# Patient Record
Sex: Male | Born: 1940 | Race: Black or African American | Hispanic: No | Marital: Married | Smoking: Former smoker
Health system: Southern US, Community
[De-identification: ages and names within clinical notes are randomized; demographics above are authoritative.]

## PROBLEM LIST (undated history)

## (undated) DIAGNOSIS — I209 Angina pectoris, unspecified: Secondary | ICD-10-CM

## (undated) DIAGNOSIS — I10 Essential (primary) hypertension: Secondary | ICD-10-CM

## (undated) DIAGNOSIS — G4733 Obstructive sleep apnea (adult) (pediatric): Secondary | ICD-10-CM

## (undated) DIAGNOSIS — I4892 Unspecified atrial flutter: Principal | ICD-10-CM

## (undated) DIAGNOSIS — I2510 Atherosclerotic heart disease of native coronary artery without angina pectoris: Secondary | ICD-10-CM

## (undated) HISTORY — PX: CORONARY ARTERY BYPASS GRAFT: SHX141

## (undated) HISTORY — PX: CARDIAC VALVE REPLACEMENT: SHX585

---

## 2009-03-28 ENCOUNTER — Encounter (INDEPENDENT_AMBULATORY_CARE_PROVIDER_SITE_OTHER): Payer: Self-pay | Admitting: Interventional Cardiology

## 2009-03-28 ENCOUNTER — Ambulatory Visit (HOSPITAL_COMMUNITY): Admission: RE | Admit: 2009-03-28 | Discharge: 2009-03-28 | Payer: Self-pay | Admitting: Interventional Cardiology

## 2009-03-28 ENCOUNTER — Inpatient Hospital Stay (HOSPITAL_BASED_OUTPATIENT_CLINIC_OR_DEPARTMENT_OTHER): Admission: RE | Admit: 2009-03-28 | Discharge: 2009-03-28 | Payer: Self-pay | Admitting: Interventional Cardiology

## 2009-03-28 ENCOUNTER — Ambulatory Visit: Payer: Self-pay | Admitting: Vascular Surgery

## 2009-04-01 ENCOUNTER — Ambulatory Visit: Payer: Self-pay | Admitting: Surgery

## 2009-04-04 ENCOUNTER — Ambulatory Visit: Payer: Self-pay | Admitting: Surgery

## 2009-04-09 ENCOUNTER — Encounter: Payer: Self-pay | Admitting: Surgery

## 2009-04-09 ENCOUNTER — Inpatient Hospital Stay (HOSPITAL_COMMUNITY): Admission: RE | Admit: 2009-04-09 | Discharge: 2009-04-16 | Payer: Self-pay | Admitting: Surgery

## 2009-04-09 ENCOUNTER — Ambulatory Visit: Payer: Self-pay | Admitting: Surgery

## 2009-05-06 ENCOUNTER — Encounter: Admission: RE | Admit: 2009-05-06 | Discharge: 2009-05-06 | Payer: Self-pay | Admitting: Surgery

## 2009-05-06 ENCOUNTER — Ambulatory Visit: Payer: Self-pay | Admitting: Surgery

## 2009-06-17 ENCOUNTER — Ambulatory Visit: Payer: Self-pay | Admitting: Surgery

## 2009-06-20 ENCOUNTER — Encounter (INDEPENDENT_AMBULATORY_CARE_PROVIDER_SITE_OTHER): Payer: Self-pay | Admitting: Cardiology

## 2009-06-20 ENCOUNTER — Ambulatory Visit (HOSPITAL_COMMUNITY): Admission: RE | Admit: 2009-06-20 | Discharge: 2009-06-20 | Payer: Self-pay | Admitting: Cardiology

## 2009-07-04 ENCOUNTER — Ambulatory Visit: Payer: Self-pay | Admitting: Surgery

## 2010-12-12 LAB — POCT I-STAT 4, (NA,K, GLUC, HGB,HCT)
Glucose, Bld: 114 mg/dL — ABNORMAL HIGH (ref 70–99)
Glucose, Bld: 120 mg/dL — ABNORMAL HIGH (ref 70–99)
Glucose, Bld: 128 mg/dL — ABNORMAL HIGH (ref 70–99)
Glucose, Bld: 128 mg/dL — ABNORMAL HIGH (ref 70–99)
Glucose, Bld: 129 mg/dL — ABNORMAL HIGH (ref 70–99)
Glucose, Bld: 135 mg/dL — ABNORMAL HIGH (ref 70–99)
Glucose, Bld: 136 mg/dL — ABNORMAL HIGH (ref 70–99)
Glucose, Bld: 143 mg/dL — ABNORMAL HIGH (ref 70–99)
Glucose, Bld: 170 mg/dL — ABNORMAL HIGH (ref 70–99)
HCT: 28 % — ABNORMAL LOW (ref 39.0–52.0)
HCT: 30 % — ABNORMAL LOW (ref 39.0–52.0)
HCT: 33 % — ABNORMAL LOW (ref 39.0–52.0)
HCT: 37 % — ABNORMAL LOW (ref 39.0–52.0)
HCT: 37 % — ABNORMAL LOW (ref 39.0–52.0)
HCT: 38 % — ABNORMAL LOW (ref 39.0–52.0)
Hemoglobin: 10.2 g/dL — ABNORMAL LOW (ref 13.0–17.0)
Hemoglobin: 10.2 g/dL — ABNORMAL LOW (ref 13.0–17.0)
Hemoglobin: 12.6 g/dL — ABNORMAL LOW (ref 13.0–17.0)
Hemoglobin: 12.6 g/dL — ABNORMAL LOW (ref 13.0–17.0)
Hemoglobin: 9.9 g/dL — ABNORMAL LOW (ref 13.0–17.0)
Potassium: 3.8 mEq/L (ref 3.5–5.1)
Potassium: 4 mEq/L (ref 3.5–5.1)
Potassium: 4.3 mEq/L (ref 3.5–5.1)
Potassium: 5.1 mEq/L (ref 3.5–5.1)
Potassium: 5.5 mEq/L — ABNORMAL HIGH (ref 3.5–5.1)
Sodium: 128 mEq/L — ABNORMAL LOW (ref 135–145)
Sodium: 129 mEq/L — ABNORMAL LOW (ref 135–145)
Sodium: 134 mEq/L — ABNORMAL LOW (ref 135–145)
Sodium: 136 mEq/L (ref 135–145)
Sodium: 136 mEq/L (ref 135–145)
Sodium: 138 mEq/L (ref 135–145)

## 2010-12-12 LAB — POCT I-STAT 3, VENOUS BLOOD GAS (G3P V)
Acid-Base Excess: 1 mmol/L (ref 0.0–2.0)
Bicarbonate: 28.3 mEq/L — ABNORMAL HIGH (ref 20.0–24.0)
pH, Ven: 7.3 (ref 7.250–7.300)

## 2010-12-12 LAB — POCT I-STAT, CHEM 8
BUN: 18 mg/dL (ref 6–23)
Calcium, Ion: 1.12 mmol/L (ref 1.12–1.32)
Chloride: 107 mEq/L (ref 96–112)
Creatinine, Ser: 0.8 mg/dL (ref 0.4–1.5)
Creatinine, Ser: 1.2 mg/dL (ref 0.4–1.5)
Glucose, Bld: 160 mg/dL — ABNORMAL HIGH (ref 70–99)
HCT: 26 % — ABNORMAL LOW (ref 39.0–52.0)
Hemoglobin: 8.8 g/dL — ABNORMAL LOW (ref 13.0–17.0)
Potassium: 3.8 mEq/L (ref 3.5–5.1)
Sodium: 141 mEq/L (ref 135–145)
TCO2: 23 mmol/L (ref 0–100)

## 2010-12-12 LAB — CREATININE, SERUM
GFR calc Af Amer: >60 mL/min (ref >60)
GFR calc non Af Amer: >60 mL/min (ref >60)

## 2010-12-12 LAB — POCT I-STAT 3, ART BLOOD GAS (G3+)
Acid-Base Excess: 2 mmol/L (ref 0.0–2.0)
Bicarbonate: 20.9 mEq/L (ref 20.0–24.0)
Bicarbonate: 24.4 mEq/L — ABNORMAL HIGH (ref 20.0–24.0)
Bicarbonate: 25.3 mEq/L — ABNORMAL HIGH (ref 20.0–24.0)
Bicarbonate: 27.5 mEq/L — ABNORMAL HIGH (ref 20.0–24.0)
Bicarbonate: 29.3 mEq/L — ABNORMAL HIGH (ref 20.0–24.0)
O2 Saturation: 100 %
O2 Saturation: 100 %
O2 Saturation: 98 %
O2 Saturation: 99 %
Patient temperature: 37
TCO2: 22 mmol/L (ref 0–100)
TCO2: 26 mmol/L (ref 0–100)
TCO2: 26 mmol/L (ref 0–100)
TCO2: 29 mmol/L (ref 0–100)
TCO2: 31 mmol/L (ref 0–100)
pCO2 arterial: 38 mmHg (ref 35.0–45.0)
pCO2 arterial: 40.4 mmHg (ref 35.0–45.0)
pCO2 arterial: 44.9 mmHg (ref 35.0–45.0)
pH, Arterial: 7.377 (ref 7.350–7.450)
pH, Arterial: 7.384 (ref 7.350–7.450)
pH, Arterial: 7.394 (ref 7.350–7.450)
pH, Arterial: 7.431 (ref 7.350–7.450)
pO2, Arterial: 396 mmHg — ABNORMAL HIGH (ref 80.0–100.0)
pO2, Arterial: 94 mmHg (ref 80.0–100.0)

## 2010-12-12 LAB — GLUCOSE, CAPILLARY
Glucose-Capillary: 122 mg/dL — ABNORMAL HIGH (ref 70–99)
Glucose-Capillary: 127 mg/dL — ABNORMAL HIGH (ref 70–99)
Glucose-Capillary: 130 mg/dL — ABNORMAL HIGH (ref 70–99)
Glucose-Capillary: 131 mg/dL — ABNORMAL HIGH (ref 70–99)
Glucose-Capillary: 137 mg/dL — ABNORMAL HIGH (ref 70–99)
Glucose-Capillary: 140 mg/dL — ABNORMAL HIGH (ref 70–99)
Glucose-Capillary: 143 mg/dL — ABNORMAL HIGH (ref 70–99)
Glucose-Capillary: 145 mg/dL — ABNORMAL HIGH (ref 70–99)
Glucose-Capillary: 147 mg/dL — ABNORMAL HIGH (ref 70–99)
Glucose-Capillary: 160 mg/dL — ABNORMAL HIGH (ref 70–99)
Glucose-Capillary: 167 mg/dL — ABNORMAL HIGH (ref 70–99)
Glucose-Capillary: 169 mg/dL — ABNORMAL HIGH (ref 70–99)
Glucose-Capillary: 170 mg/dL — ABNORMAL HIGH (ref 70–99)
Glucose-Capillary: 175 mg/dL — ABNORMAL HIGH (ref 70–99)
Glucose-Capillary: 180 mg/dL — ABNORMAL HIGH (ref 70–99)
Glucose-Capillary: 202 mg/dL — ABNORMAL HIGH (ref 70–99)
Glucose-Capillary: 67 mg/dL — ABNORMAL LOW (ref 70–99)

## 2010-12-12 LAB — BASIC METABOLIC PANEL
BUN: 11 mg/dL (ref 6–23)
BUN: 17 mg/dL (ref 6–23)
BUN: 24 mg/dL — ABNORMAL HIGH (ref 6–23)
CO2: 24 mEq/L (ref 19–32)
Calcium: 7.8 mg/dL — ABNORMAL LOW (ref 8.4–10.5)
Calcium: 8.8 mg/dL (ref 8.4–10.5)
Chloride: 100 mEq/L (ref 96–112)
Chloride: 104 mEq/L (ref 96–112)
Creatinine, Ser: 1.37 mg/dL (ref 0.4–1.5)
GFR calc Af Amer: >60 mL/min (ref >60)
GFR calc non Af Amer: >60 mL/min (ref >60)
GFR calc non Af Amer: >60 mL/min (ref >60)
Glucose, Bld: 152 mg/dL — ABNORMAL HIGH (ref 70–99)
Glucose, Bld: 153 mg/dL — ABNORMAL HIGH (ref 70–99)
Potassium: 3.6 mEq/L (ref 3.5–5.1)
Sodium: 137 mEq/L (ref 135–145)

## 2010-12-12 LAB — PROTIME-INR
Prothrombin Time: 12.8 seconds (ref 11.6–15.2)
Prothrombin Time: 17.1 seconds — ABNORMAL HIGH (ref 11.6–15.2)

## 2010-12-12 LAB — CBC
HCT: 23.3 % — ABNORMAL LOW (ref 39.0–52.0)
HCT: 28.5 % — ABNORMAL LOW (ref 39.0–52.0)
HCT: 32.6 % — ABNORMAL LOW (ref 39.0–52.0)
HCT: 40.9 % (ref 39.0–52.0)
Hemoglobin: 8.8 g/dL — ABNORMAL LOW (ref 13.0–17.0)
Hemoglobin: 9.8 g/dL — ABNORMAL LOW (ref 13.0–17.0)
MCHC: 33.6 g/dL (ref 30.0–36.0)
MCHC: 34.3 g/dL (ref 30.0–36.0)
MCV: 94.2 fL (ref 78.0–100.0)
MCV: 94.8 fL (ref 78.0–100.0)
MCV: 94.9 fL (ref 78.0–100.0)
MCV: 95.2 fL (ref 78.0–100.0)
MCV: 95.9 fL (ref 78.0–100.0)
Platelets: 104 10*3/uL — ABNORMAL LOW (ref 150–400)
Platelets: 135 10*3/uL — ABNORMAL LOW (ref 150–400)
Platelets: 158 10*3/uL (ref 150–400)
RBC: 2.45 MIL/uL — ABNORMAL LOW (ref 4.22–5.81)
RBC: 2.54 MIL/uL — ABNORMAL LOW (ref 4.22–5.81)
RBC: 2.7 MIL/uL — ABNORMAL LOW (ref 4.22–5.81)
RBC: 3.46 MIL/uL — ABNORMAL LOW (ref 4.22–5.81)
RBC: 4.31 MIL/uL (ref 4.22–5.81)
RDW: 13 % (ref 11.5–15.5)
RDW: 13.2 % (ref 11.5–15.5)
RDW: 13.3 % (ref 11.5–15.5)
WBC: 12 10*3/uL — ABNORMAL HIGH (ref 4.0–10.5)
WBC: 13.8 10*3/uL — ABNORMAL HIGH (ref 4.0–10.5)
WBC: 7.4 10*3/uL (ref 4.0–10.5)
WBC: 9.5 10*3/uL (ref 4.0–10.5)

## 2010-12-12 LAB — BLOOD GAS, ARTERIAL
Acid-Base Excess: 2.3 mmol/L — ABNORMAL HIGH (ref 0.0–2.0)
Bicarbonate: 26.3 mEq/L — ABNORMAL HIGH (ref 20.0–24.0)
TCO2: 27.6 mmol/L (ref 0–100)
pCO2 arterial: 40.8 mmHg (ref 35.0–45.0)
pO2, Arterial: 95.8 mmHg (ref 80.0–100.0)

## 2010-12-12 LAB — COMPREHENSIVE METABOLIC PANEL
AST: 28 U/L (ref 0–37)
Alkaline Phosphatase: 64 U/L (ref 39–117)
BUN: 10 mg/dL (ref 6–23)
CO2: 24 mEq/L (ref 19–32)
Chloride: 104 mEq/L (ref 96–112)
Creatinine, Ser: 0.97 mg/dL (ref 0.4–1.5)
GFR calc non Af Amer: >60 mL/min (ref >60)
Total Bilirubin: 1 mg/dL (ref 0.3–1.2)

## 2010-12-12 LAB — POCT I-STAT GLUCOSE: Glucose, Bld: 132 mg/dL — ABNORMAL HIGH (ref 70–99)

## 2010-12-12 LAB — TYPE AND SCREEN: ABO/RH(D): O POS

## 2010-12-12 LAB — ABO/RH: ABO/RH(D): O POS

## 2010-12-12 LAB — MAGNESIUM
Magnesium: 2.3 mg/dL (ref 1.5–2.5)
Magnesium: 2.5 mg/dL (ref 1.5–2.5)

## 2010-12-12 LAB — URINALYSIS, ROUTINE W REFLEX MICROSCOPIC
Glucose, UA: NEGATIVE mg/dL
Hgb urine dipstick: NEGATIVE
Protein, ur: NEGATIVE mg/dL
pH: 8 (ref 5.0–8.0)

## 2010-12-12 LAB — HEMOGLOBIN AND HEMATOCRIT, BLOOD
HCT: 28.3 % — ABNORMAL LOW (ref 39.0–52.0)
Hemoglobin: 9.9 g/dL — ABNORMAL LOW (ref 13.0–17.0)

## 2010-12-12 LAB — HEMOGLOBIN A1C: Mean Plasma Glucose: 137 mg/dL

## 2010-12-13 LAB — POCT I-STAT 3, VENOUS BLOOD GAS (G3P V)
Acid-Base Excess: 2 mmol/L (ref 0.0–2.0)
Bicarbonate: 28.4 mEq/L — ABNORMAL HIGH (ref 20.0–24.0)
O2 Saturation: 63 %
TCO2: 30 mmol/L (ref 0–100)
pH, Ven: 7.358 — ABNORMAL HIGH (ref 7.250–7.300)
pO2, Ven: 40 mmHg (ref 30.0–45.0)

## 2010-12-13 LAB — POCT I-STAT 3, ART BLOOD GAS (G3+)
Acid-Base Excess: 2 mmol/L (ref 0.0–2.0)
Acid-Base Excess: 2 mmol/L (ref 0.0–2.0)
Bicarbonate: 27.9 mEq/L — ABNORMAL HIGH (ref 20.0–24.0)
O2 Saturation: 89 %
TCO2: 29 mmol/L (ref 0–100)
pH, Arterial: 7.337 — ABNORMAL LOW (ref 7.350–7.450)
pO2, Arterial: 59 mmHg — ABNORMAL LOW (ref 80.0–100.0)

## 2011-01-19 NOTE — Assessment & Plan Note (Signed)
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  He was noted to have an  aortic insufficiency murmur on his follow up visit with Dr. Katrinka Blazing on  April 30, 2009 and 2-D echocardiogram confirmed that there was a mild  perivalvular leak as well as possibly some insufficiency through the  valve itself.  At that time, the patient was having some heart failure  symptoms with orthopnea and peripheral edema requiring diuretics.  He  required increased in dose of diuretics to manage his congestive heart  failure and was on 80 mg per day when I last saw him on October 12.  I  felt that it would be best to do a transesophageal echocardiogram to try  to determine whether this was a true perivalvular leak or whether there  was some valve dysfunction and causing regurgitation through the valve.  This was performed recently and showed that it was a perivalvular leak.  I think the degree of regurgitation is probably in the mild-to-moderate  range.  The valve itself appeared to be functioning well.  Left  ventricular function appeared good.   Since I last saw the patient he said that he has been feeling much  better.  He is walking daily without any shortness of breath.  He has  had no orthopnea or PND, is lying flat in bed.  He is now on Lasix 40 mg  per day and his lower extremity edema has completely resolved.  He is  anxious to return to work.  He said that he has not been having any  shortness of breath with ambulation or going up and down stairs which  was not the case preoperatively.   PHYSICAL EXAMINATION:  General:  Today, he looks well.  Vital Signs:  Blood pressure 148/60 with pulse of 70,  respiratory rate is 18 and  unlabored.  Oxygen saturation on room air is 98%.  Cardiac:  Regular  rate and rhythm with a grade 1-2/6 systolic murmur over his aorta.  There is a grade 2/6 decrescendo AI murmur.  Lungs:  Clear.  Chest:  The  chest incision is healing well and the sternum is stable.  There is no  peripheral edema in the legs.   His medications are Zocor 20 mg at bedtime, aspirin 81 mg daily,  Lopressor 25 mg b.i.d., and Lasix 40 mg per day.   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  I think  the 2-D echocardiogram is probably more reliable for quantitating this  and it was mild by 2-D echo.  The valve itself appears to be functioning  well without any significant regurgitation through the valve.  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  This diastolic dysfunction may be responsible for his  early postoperative congestive heart failure symptoms.  Clinically, he  is continuing to  improve and his diuretic dose is now down to 40 mg per  day with no peripheral edema.  He has no symptoms at this time.  I  think, given the degree of perivalvular leak and his lack of symptoms  that it would be best to continue following this with periodic  echocardiogram.  Aortic insufficiency is very well tolerated and  hopefully this will not require any intervention in the future.  We can  follow this with periodic echocardiogram to assess the degree of  regurgitation as well as assess his left ventricular function for signs  of deterioration.  I would not recommend proceeding with repair of this  problem unless he develops left ventricular dysfunction, worsening  symptoms, or hemolysis.  I discussed all this with the patient and his  wife and son, and they are in full  agreement.  The patient will continue  to follow up with Dr. Katrinka Blazing and hopefully, can have another  echocardiogram in about 6 months.  I will be happy to see the patient  after that.   Evelene Croon, M.D.  Electronically Signed   BB/MEDQ  D:  07/04/2009  T:  07/05/2009  Job:  [id number]

## 2011-01-19 NOTE — Op Note (Signed)
NAMEARAV, BANNISTER NO.:  [id number]   MEDICAL RECORD NO.:  [id number]          PATIENT TYPE:  INP   LOCATION:  2314                         FACILITY:  [facility]   PHYSICIAN:  Evelene Croon, M.D.     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   OPERATIVE PROCEDURE:  Median sternotomy, extracorporeal circulation,  coronary artery bypass graft surgery x3 using a left internal mammary  artery graft to left anterior descending coronary artery, saphenous vein  grafts to the obtuse marginal branch of the left circumflex coronary  artery, and the posterior descending branch of the right coronary.  Endoscopic vein harvesting from both legs.  Aortic valve replacement  using a 23-mm Edwards pericardial Magna-Ease valve.   ATTENDING SURGEON:  Evelene Croon, MD   ASSISTANT:  Coral Ceo, PA   ANESTHESIA:  General endotracheal.   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  He presented with a 1-year history of  exertional fatigue and chest discomfort that he could not remember very  specifically now.  The symptoms have progressed over the past year.  An  echocardiogram was done in February 2009, that was showing an aortic  valve area of 1.2 sq cm.  A more recent echo on March 13, 2009, showed  progression of his aortic stenosis to a moderate degree with aortic  valve area of 1.0 sq cm with a peak gradient of 41 and a mean gradient  of 16.  There was left ventricular diastolic dysfunction with relaxation  abnormality.  There was mild mitral regurgitation.  Ejection fraction of  50-55% with normal left  ventricular size.  The patient had a Cardiolite  scan done which showed a large area of anteroapical and anterolateral  ischemia with an ejection fraction of 53%.  He subsequently underwent  cardiac catheterization by Dr. Katrinka Blazing on March 28, 2009, which showed  about 50-60% calcified distal left main stenosis.  The LAD had an 80-90%  ostial stenosis and 80% distal stenosis.  The diagonal branch had about  90% proximal stenosis and that was occluded in its mid vessel with faint  filling of the distal vessel by collaterals.  Left circumflex had 60-70%  ostial stenosis compromising a large third marginal branch that also had  about 80% stenosis in it.  The right coronary artery was nondominant  vessel and had 99% midvessel and distal vessel stenosis.  After review  of the catheterization and examination with the patient, it was felt  that coronary bypass graft surgery and aortic valve replacement were the  best treatment to prevent further ischemia and infarction and improve  his symptoms and quality of life.  I discussed the operative procedure  with the patient and his wife including alternatives,  benefits, and  risks including but not limited to bleeding, blood transfusion,  infection, stroke, myocardial infarction, graft failure, heart block  requiring permanent pacemaker, and death.  We also discussed the pros  and cons of mechanical and tissue valves.  He decided that he would  rather have a tissue valve, so that he would not be on Coumadin and I  thought that it was reasonable choice given his age of 70 years with  severe coronary artery disease.  He understood the chance of this valve  may fail due to structural valve deterioration over his lifetime.   OPERATIVE PROCEDURE:  The patient was taken operating room and placed on  the table in the supine position.  After induction of general  endotracheal anesthesia, a Foley catheter was placed in the bladder  using sterile technique.  Then,  the chest, abdomen, and both lower  extremities were prepped and draped in the usual sterile manner.  Preoperative intravenous antibiotics were given.  Transesophageal  echocardiogram was performed and showed moderate aortic stenosis with an  aortic valve area measured about 1.2 sq cm.  There was some mild mitral  regurgitation.  Left ventricular function appeared well preserved.   Then, the chest was opened through a median sternotomy incision and the  pericardium was opened in the midline.  Examination of the heart showed  good ventricular contractility.  The ascending aorta was of normal size.  There was significant calcified plaque present in the aortic root  portion.  Then, the left internal mammary artery was harvested as a  pedicle graft.  This was a medium-to-large caliber vessel with excellent  blood flow through it.  At the same time, the segment of greater  saphenous vein was harvested from the right leg using endoscopic vein  harvest technique.  This vein was of medium size and good quality in the  thigh, but below the knee became very small and had a thin, friable wall  and it was not felt to be suitable.  Therefore, another section of  saphenous vein was harvested from the left thigh using endoscopic vein  harvest technique.  This vein was of medium size and good quality.   Then, the patient was heparinized.  When an adequate activated clotting  time was achieved, the distal ascending aorta was cannulated using a 22-  French aortic cannula for arterial inflow.  Venous outflow was achieved  using 2-stage venous cannula through the right atrial appendage.  An  antegrade cardioplegia and vent cannula was inserted in the aortic root.  A left ventricular vent was placed through the right superior pulmonary  vein and a retrograde cardioplegia cannula was inserted through the  right atrium and the coronary sinus.   Then, the patient was placed in the cardiopulmonary bypass and  distal  coronaries were identified.  He had severe diffuse calcific coronary  artery disease.  The LAD was intramyocardial along its proximal and  midportions.  It exited to the surface of the heart in its distal third,  just before there was an area of calcific plaque noted on the  catheterization responsible for the 80% distal stenosis.  Beyond this,  the apical portion of the LAD was diffusely calcified and not graftable.  Therefore, I decided to graft just before this lesion to supply the  proximal and mid LAD and the entire septum.  The diagonal branch was  diffusely calcified and not graftable.  There was no area that was soft  enough to  open.  This was a relatively small vessel, although long.  The  left circumflex gave off the large marginal branch which was visible  proximally and was suitable for grafting here before it became  intramyocardial.  The right coronary artery was a relatively small  vessel that was also diffusely diseased.  This was followed out along  the proximal portion of the posterior descending artery where there were  some calcified plaque.  Just beyond this calcified plaque, the artery  was felt to be graftable, although it was small.  The mid and distal  portions of the posterior descending artery were small and lying beneath  a large posterior descending vein.  The distal right coronary artery  beyond the posterior descending branch was also diffusely calcified.   Then, the aorta was crossclamped and 500 mL of cold blood antegrade  cardioplegia was administered in the aortic root with quick arrest of  the heart.  This was followed by 500 mL of cold blood retrograde  cardioplegia.  Systemic hypothermia to 20 degrees of centigrade and  topical hypothermic iced saline was used.  A temperature probe was  placed in the septum insulating pad in the pericardium.   Then, the first distal anastomosis was performed to the posterior  descending coronary artery.  The  internal diameter was about 1.5-1.6 mm.  The conduit used was a segment of greater saphenous vein and the  anastomosis was performed in an end-to-side manner using continuous 7-0  Prolene suture.  Flow was noted through the graft and was excellent.   The second distal anastomosis was performed to the obtuse marginal  branch.  The internal diameter of this vessel was about 2 mm.  The  conduit used was a segment of greater saphenous vein and the anastomosis  was performed in an end-to-side manner using continuous 7-0 Prolene  suture.  Flow was noted through the graft and was excellent.  Then,  another dose of cardioplegia was given down the vein grafts and into the  aortic root.   The third distal anastomosis was performed to the left anterior  descending coronary artery.  The internal diameter of this vessel was  about 1.75-2 mm.  The conduit used was the left internal mammary graft  and was brought through an opening of the left pericardium and anterior  to the phrenic nerve.  This was anastomosed to the LAD in an end-to-side  manner using continuous 8-0 Prolene suture.  The pedicle was sutured to  the epicardium with 6-0 Prolene sutures.  Then, another dose of  antegrade cardioplegia was given followed by retrograde cardioplegia and  throughout the remainder of the procedure.  Doses of retrograde cold  blood cardioplegia were given about 20-minute intervals to maintain  myocardial temperature around 10 degrees centigrade or less.   Then, the aorta was opened transversely about 2 cm above the sinotubular  junction.  We had to open it slightly higher due to the calcified plaque  in the aortic root.  Examination of the native valve showed that there  were 3 leaflets that were somewhat calcified and poorly mobile.  There  was mild annular calcification.  The right and left coronary ostia were  identified.  There was some calcified plaque around the ostia.  Then,  the native valve was  excised.  The annulus was decalcified with  rongeurs.  Care was taken to remove all particular debris.  The left  ventricle and aortic root were then irrigated with iced  saline solution.  Then, the annulus was sized and a 23-mm Edwards Interior and spatial designer  valve was chosen.  This had model number 3300TFX, serial number [id number].  Then, a series of pledgeted 2-0 Ethibond horizontal mattress sutures  were placed around the aortic cannula to the pledgets in the subannular  position.  The sutures were placed through the sewing ring and a valve  lowered into place.  The sutures were tied sequentially.  The valve  seated nicely.  The coronary ostia were not obstructed.  Then, the  patient was rewarmed to 37 degrees centigrade.  The aortotomy was closed  in 2 layers using continuous 4-0 Prolene suture.  The aortotomy was  closed that was lightly coated with CoSeal for hemostasis.   Then, the 2 proximal vein graft anastomoses were performed to the  midportion of the ascending aorta in an end-to-side manner using  continuous 6-0 Prolene suture.  Then, the left side of the heart was de-  aired and the head was placed in a Trendelenburg position.  Crossclamp  was removed with time of 140 minutes.  There was spontaneous return of a  complete heart block rhythm which continued to improve into sinus  rhythm.  The proximal and distal anastomoses appeared hemostatic and  allowed the grafts to be satisfactory.  Graft markers were placed around  the proximal anastomoses.  Two temporary right ventricular and right  atrial pacing wires were placed and brought through the skin.   The patient was rewarmed to 37 degrees centigrade.  He was weaned from  cardiopulmonary bypass on a low-dose dopamine.  Total bypass time was  177 minutes.  Cardiac function appeared excellent with cardiac output of  7 liters per minute.  Transesophageal echocardiogram was again reviewed  and showed a normal functioning aortic  valve prosthesis.  There was no  regurgitation or perivalvular leak.  There was mild mitral regurgitation  that was unchanged from preoperatively.  Left ventricular function  appeared well preserved.  Then, protamine was given and the venous and  aortic cannulas were removed without difficulty.  Hemostasis was  achieved.  Three chest tubes were placed with 2 in the posterior  pericardium, one in the left pleural space, and one in the anterior  mediastinum.  The sternum was then closed with double #6 stainless steel  wires.  The fascia was closed with continuous #1 Vicryl suture.  Subcutaneous tissue was closed with continuous 2-0 Vicryl and the skin  with 3-0 Vicryl subcuticular closure.  The lower extremity vein harvest  site was closed in layers in a similar manner.  The sponge, needle, and  instrument  counts were correct according to the scrub nurse.  Dry sterile dressing  was applied over the incisions around the chest tubes which were Pleur-  Evac suction.  The patient remained hemodynamically stable and  transferred to the SICU in guarded but stable condition.      Evelene Croon, M.D.  Electronically Signed     BB/MEDQ  D:  04/09/2009  T:  04/10/2009  Job:  [id number]   cc:   Lyn Records, M.D.

## 2011-01-19 NOTE — Consult Note (Signed)
NEW PATIENT CONSULTATION   [name] [name]  DOB:  11-21-1940                                        April 01, 2009  CHART #:  [id number]   REFERRING PHYSICIAN:  Lyn Records, MD   REASON FOR CONSULTATION:  Severe three-vessel coronary artery disease  and moderate aortic stenosis.   CLINICAL HISTORY:  I was asked by Dr. Katrinka [name] to evaluate the patient for  consideration of coronary artery bypass graft surgery and aortic valve  replacement.  He is a 70 year old African American gentleman who has a  history of rheumatic fever as a child.  He presented with about a1-year  history of exertional fatigue and chest discomfort that he cannot be  very specific about.  These symptoms are related to exertion such as  walking up stairs or carrying heavy objects.  They have progressed over  the past year.  An echocardiogram was done in February 2009 that showed  an aortic valve area of 1.2 sq. cm.  A more recent echo performed on  March 13, 2009, showed progression of his aortic stenosis to a moderate  degree with an aortic valve area of 1.0 sq. cm with a peak gradient of  41 and a mean gradient of 16.  There is left ventricular diastolic  dysfunction with relaxation abnormality.  There is mild mitral  regurgitation.  Ejection fraction was 50-55% with normal left  ventricular size.  The patient also underwent a Cardiolite exam which  showed a large area of anterior apical and anterolateral ischemia with  an ejection fraction of 53%.  He subsequently underwent cardiac  catheterization by Dr. Katrinka [name] on March 28, 2009.  This showed about 50-60%  calcified distal left main stenosis.  The LAD had 80-90% ostial stenosis  and 80% distal stenosis.  The diagonal branch had 90% proximal and 100%  midvessel stenosis with filling of the distal vessel slowly by  collaterals.  The left circumflex had 60-70% ostial stenosis  compromising a large third marginal branch that also had about 80%  stenosis.  The right coronary artery was nondominant vessel that had 99%  midvessel and distal vessel stenosis.   His review of systems is as follows:  GENERAL:  He denies any fever or chills.  He has had no recent weight  changes.  He has had fatigue.  EYES:  Negative.  ENT:  Negative.  He has seen a dentist recently and has had some ongoing  dental work.  CARDIOVASCULAR:  He has a vague chest tightness or pressure as well as  exertional shortness of breath.  He has had no symptoms at rest.  He  denies PND and orthopnea.  He denies peripheral edema.  RESPIRATORY:  He denies cough and sputum production.  GI:  He has had no nausea or vomiting.  Denies melena and bright red  blood per rectum.  GU:  Denies dysuria and hematuria.  VASCULAR:  He has had claudication in both calves while walking about  one-half mile.  This is relieved with rest.  NEUROLOGIC:  He denies any focal weakness or numbness.  He denies  dizziness and syncope.  He has never had TIA or a stroke.  MUSCULOSKELETAL:  He denies arthralgias and myalgias.  PSYCHIATRIC:  Negative.  HEMATOLOGIC:  Negative.   ALLERGIES:  None.   MEDICATIONS:  1.  Diovan 160 mg daily.  2. Simvastatin 20 mg at bedtime.  3. Omega-3 3000 mg daily.  4. Multivitamin daily.  5. Aspirin 81 mg daily.   PAST MEDICAL HISTORY:  Significant for hypertension.  He has a history  of aortic valve stenosis.  He has a history of rheumatic fever as a  child.  He denies any previous surgery.  His only previous  hospitalization was for rheumatic fever.   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  He is a previous smoker.  He has 2 adult children.  He drinks alcohol occasionally.   FAMILY HISTORY:  Negative for history of coronary disease or aortic  valve disease.   PHYSICAL EXAMINATION:  His blood pressure is 160/84, his pulse is 63 and  regular, respiratory rate is 18 and  unlabored.  Oxygen saturation on  room air is 97%.  He is a well-developed gentleman in no acute distress.  HEENT exam shows him to be normocephalic and atraumatic.  Pupils are  equal and reactive to light and accommodation.  Extraocular muscles are  intact.  His throat is clear.  Neck exam shows normal carotid pulses  bilaterally.  There is a transmitted murmur to both sides of the neck.  There is no adenopathy or thyromegaly.  Cardiac exam shows a regular  rate and rhythm with a grade 3/6 systolic murmur over the aorta.  There  is no diastolic murmur.  His lungs are clear.  Abdominal exam shows  active bowel sounds.  His abdomen is soft and nontender.  There are no  palpable masses or organomegaly.  Extremity exam shows no peripheral  edema.  Pedal pulses are not palpable bilaterally.  Skin is warm and  dry.  Neurologic exam shows him to be alert and oriented x3.  Motor and  sensory exam is grossly normal.   IMPRESSION:  The patient has severe three-vessel coronary disease and  moderate aortic stenosis with a markedly abnormal stress test.  He has  had progressive symptoms over the past year.  I agree that the best  treatment is coronary artery bypass graft surgery and aortic valve  replacement.  I discussed the choices of valve replacement prosthesis  including mechanical and tissue valves.  We discussed the pros and cons  of both.  He and his wife are in agreement that they would rather have a  tissue valve, so that he does not need to be on Coumadin.  I think that  it is a reasonable choice at his age with concomitant coronary disease.  I discussed the procedure of coronary bypass surgery and aortic valve  replacement including benefits and risks.  We discussed the risk of  bleeding, blood transfusion, infection, stroke, myocardial infarction,  graft failure, heart block requiring a permanent pacemaker, and death.  He understands all of this and would like to proceed as quickly as   possible.  We will schedule surgery on Wednesday, April 09, 2009.   [name] [name], M.D.  Electronically Signed   BB/MEDQ  D:  04/01/2009  T:  04/02/2009  Job:  [id number]   cc:   [name] [name], M.D.

## 2011-01-19 NOTE — Assessment & Plan Note (Signed)
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  His postoperative course  was complicated by atrial fibrillation, and he was cardioverted into  sinus rhythm.  Since discharge, he said that he had difficulty sleeping  in his bed at night for the first couple of weeks due to some vague  tightness in his chest.  He said he felt much better sitting up.  He did  not have any shortness of breath.  He said that these symptoms have  essentially resolved, and he is now sleeping in bed.  His appetite was  not very good initially but is improving.  He is walking daily without  chest pain or shortness of breath.  He saw Dr. Darci Needle the III  in his office on April 30, 2009.  He said that Dr. Katrinka Blazing had noticed a  new murmur suggesting aortic insufficiency and order an echocardiogram,  which was done this morning.  The patient denies any fever or chills,  and he said he has been feeling fairly well overall.   PHYSICAL EXAMINATION:  Vital Signs:  His blood pressure 146/68 and his  pulse is 69 and regular.  Respiratory rate is 18, unlabored.  Oxygen  saturation on room air is 99%.  He looks well.  Cardiac:  A regular rate  and rhythm with a grade 1/6 systolic murmur along the right upper  sternal border and a grade 2/6 decrescendo murmur of aortic  insufficiency.  The chest incision is healing well and the sternum is  stable.  His leg incision is healing well, and there is no peripheral  edema.   A followup chest x-ray shows clear lung fields and no pleural effusions.   MEDICATIONS:  1. Amiodarone 200 mg b.i.d.  2. Lopressor 25 mg b.i.d.  3. Iron 325 mg daily.  4. Aspirin 325 mg daily.  5. Zocor  20 mg at nighttime.   IMPRESSION:  Overall, the patient is recovering well following his  surgery.  He appears to be maintaining sinus rhythm on oral amiodarone.  Dr. Katrinka Blazing will decide when to discontinue his amiodarone.  He does have  a new diastolic murmur suggesting aortic insufficiency, which he did not  have while he was in the hospital.  His post bypass TEE showed no  evidence of perivalvular leak or regurgitation.  His murmur suggests  that he has developed a perivalvular leak.  He had an echocardiogram  today, and I will follow up on the results of that with Dr. Katrinka Blazing.  He  seems to be doing well overall without any shortness of breath.  I told  him he could return to driving a car when he feels comfortable with  that, but should refrain lifting anything heavier than 10 pounds for  total of 3 months from date of surgery.  He has a followup appointment  in late September with Dr. Katrinka Blazing, and I will plan to see him back after  that.   Evelene Croon, M.D.  Electronically Signed  BB/MEDQ  D:  05/06/2009  T:  05/07/2009  Job:  [id number]   cc:   Lyn Records, M.D.

## 2011-01-19 NOTE — H&P (Signed)
NAMEBECKY, BERBERIAN NO.:  [id number]   MEDICAL RECORD NO.:  [id number]          PATIENT TYPE:  OUT   LOCATION:  VASC                         FACILITY:  [facility]   PHYSICIAN:  Zenon Mayo, MDDATE OF BIRTH:  [birth year]   DATE OF ADMISSION:  03/28/2009  DATE OF DISCHARGE:  03/28/2009                              HISTORY & PHYSICAL   PROCEDURE:  Intraoperative transesophageal echocardiogram.   INDICATION:  Aortic stenosis.   DESCRIPTION:  Mr. Stracener is a 70 year old gentleman with a history of  hypertension and coronary disease who was found to have aortic stenosis  and is to be brought to the operating room today by Dr. Laneta Simmers for  coronary artery bypass grafting as well as replacement of his aortic  valve.  Intraoperative echocardiogram was requested to evaluate the  aortic valve repair as well as any other anomalies that may arise.  The  patient was brought to the operating room and placed under general  anesthesia.  After confirmation of endotracheal tube placement and  orogastric suctioning, a transesophageal echo probe was placed in the  patient's esophagus without complication.  The 4-chamber view of the  heart revealed normal cardiac motion in all chambers.  There was no  pericardial effusion noted.  Further evaluation of the left ventricle  revealed a mildly thickened ventricular wall; however, there were no  wall motion abnormalities seen and the ejection fraction was estimated  to be 60%.  The mitral valve was then imaged and the mitral valves had  minimal to no calcification.  The leaflets of the valve appeared to  coapt well.  When collar was placed across the valve only a trace amount  of mitral regurgitation was noted.  The aortic valve was then imaged and  revealed a trileaflet valve that was thickened with significantly  reduced excursion.  There was some minimal calcification seen.  The  aortic valve area was measured to be 1.2 cm2 by  planimetry.  A trace  amount of aortic insufficiency was noted in the long axis view.  The  aortic root measurements were all normal.  The next structure evaluated  was the pulmonic valve, which appeared to be both normal in structure  and function.  The tricuspid valve was also normal in both structure and  function.  The interatrial septum did not reveal any PFO or other septal  defects.  The left atrial appendage was free from thrombus.  The  thoracic aorta had minimal atherosclerotic disease throughout.   After weaning from the cardiopulmonary bypass machine on dopamine, the  heart was again evaluated.  A 23-mm stentless tissue valve has been  placed into the aortic position.  The leaflets of the valve were  difficult to visualize, but there did not seem to be tremendous amount  turbulent flow moving forward.  There did appear to be some small amount  of regurgitation around the valve when viewed in the long axis view;  however, there was no aortic insufficiency seen in the short axis view.  The amount of regurgitation was not significant enough to pursue any  further.  The mitral valve that revealed trace mitral regurgitation in  the prebypass had a mild amount of regurgitation post bypass.  The left  ventricle remained vigorous with normal wall motion and no abnormalities  were seen.  The patient actually had to be weaned off of the dopamine to  maintain a normal blood pressure as he had gotten too hyperdynamic on  the dopamine.  At the conclusion of the procedure, the transesophageal  echo probe was removed from the patient's esophagus without any evidence  of trauma or complication.  The patient was taken directly from the  operating room to the ICU in stable condition.          ______________________________  Zenon Mayo, MD    WEF/MEDQ  D:  04/09/2009  T:  04/10/2009  Job:  [id number]

## 2011-01-19 NOTE — Op Note (Signed)
NAMEKAYODE, PETION NO.:  [id number]   MEDICAL RECORD NO.:  [id number]          PATIENT TYPE:  INP   LOCATION:  2003                         FACILITY:  [facility]   PHYSICIAN:  Lyn Records, M.D.   DATE OF BIRTH:  1941/07/08   DATE OF PROCEDURE:  04/15/2009  DATE OF DISCHARGE:                               OPERATIVE REPORT   INDICATION:  Postoperative atrial flutter with difficult to control rate  despite beta-blocker therapy, Cardizem, and amiodarone.   PROCEDURE PERFORMED:  Elective cardioversion.   DESCRIPTION:  The attending anesthesiologist was Dr. Judie Petit.  Propofol 70 mg was titrated intravenously to get the patient asleep.  Once asleep, he was given a single discharge of 100 watts of biphasic  energy with reversion to sinus bradycardia.  The patient awakened from  anesthesia without evidence of embolic event.  Sinus bradycardia  persisted after the procedure.  Hemodynamics were stable.   CONCLUSIONS:  Successful conversion from atrial flutter to normal sinus  rhythm/sinus bradycardia.   PLAN:  1. Decrease beta-blocker dose to 25 mg of metoprolol b.i.d.  2. Continue to low with amiodarone with plan to continue amiodarone      therapy for 6-8 weeks postop and then discontinue.      Lyn Records, M.D.  Electronically Signed     HWS/MEDQ  D:  04/15/2009  T:  04/15/2009  Job:  [id number]   cc:   Evelene Croon, M.D.

## 2011-01-19 NOTE — Discharge Summary (Signed)
[name] [name], [name] [name] NO.:  [id number]   MEDICAL RECORD NO.:  [id number]          PATIENT TYPE:  INP   LOCATION:  2003                         FACILITY:  [facility]   PHYSICIAN:  Evelene Croon, M.D.     DATE OF BIRTH:  Jan 22, 1941   DATE OF ADMISSION:  04/09/2009  DATE OF DISCHARGE:  04/16/2009                               DISCHARGE SUMMARY   PRIMARY ADMITTING DIAGNOSES:  1. Severe three-vessel coronary artery disease.  2. Moderate aortic stenosis.   ADDITIONAL/DISCHARGE DIAGNOSES:  1. Severe three-vessel coronary artery disease.  2. Moderate aortic stenosis.  3. History of rheumatic fever.  4. Postoperative atrial flutter.  5. Hypertension.  6. Remote history of tobacco abuse.  7. Perioperative hyperglycemia with mildly elevated hemoglobin A1c at      6.4.  8. Postoperative pericarditis.   PROCEDURES PERFORMED:  1. Coronary artery bypass grafting x3 (left internal mammary artery to      the LAD, saphenous vein graft to the right coronary, saphenous vein      graft to the obtuse marginal).  2. Endoscopic vein harvest, left thigh and right lower extremity.  3. Aortic valve replacement with 23-mm Magna Ease pericardial tissue      valve.  4. Direct current cardioversion.   HISTORY:  The patient is a 70 year old male with a known history of  rheumatic fever as a child.  Approximately 1 year ago, he presented with  exertional fatigue and chest discomfort which were nonspecific and have  continued to progress.  He was seen by Dr. Katrinka Blazing and underwent an  echocardiogram in February 2009, which showed an aortic valve area of  1.2 cm2.  More recently in July 2010, an echo showed progression of his  aortic stenosis to a moderate degree with an aortic valve area of 1.0  cm2 with a peak gradient of 41 and a mean gradient of 16.  There was  left ventricular diastolic dysfunction and mild mitral regurgitation.  Ejection fraction was 50-55% with normal left ventricular  size.  The  patient underwent a Cardiolite exam which showed a large area of  anterior apical and anterolateral ischemia with an ejection fraction of  53%.  Thus, he underwent cardiac catheterization by Dr. Katrinka Blazing on March 28, 2009.  This showed about 50-60% calcified distal left main stenosis  with an 80-90% ostial LAD and an 80% distal LAD stenosis.  The diagonal  branch had a 90% proximal and 100% mid vessel stenosis with filling by  collaterals.  The left circumflex had a 60-70% ostial stenosis  compromising a large third marginal branch, also had about an 80%  stenosis.  The right was a nondominant vessel with a 99% midvessel and  distal vessel stenosis.  Because of these findings and his worsening  symptoms, he was referred to Dr. Evelene Croon as an outpatient  consultation for consideration of surgical revascularization.  After  review of his films, Dr. Laneta Simmers agreed that his best option would be to  proceed with CABG at this time.  He explained all risks, benefits, and  alternatives of surgery  to the patient and he agreed to proceed.   HOSPITAL COURSE:  [name] was admitted as an outpatient elective  admission on April 09, 2009.  He was taken to the operating room and  underwent CABG x3 and aortic valve replacement as described above.  Please see previously dictated operative report for complete details of  surgery.  He tolerated the procedure well and was transferred to the  SICU in stable condition.  He was able to be extubated shortly after  surgery.  He was hemodynamically stable, although hypotensive and  vasodilated on postop day 1, requiring a Neo-Synephrine drip.  He was  not immediately started on a beta blocker secondary to his blood  pressure and heart rate.  He also required backup atrial pacing.  By  postop day 2, he had been weaned from all drips and was restarted on a  low-dose beta blocker.  His chest tubes and lines were removed and he  was able to be  transferred to the step-down unit.  His postoperative  course has been complicated by a pericarditis which has been treated  conservatively and has symptomatically resolved at this point.  Also, he  developed atrial flutter which was not resolved with rapid atrial pacing  or IV amiodarone.  He ultimately required a direct current cardioversion  on April 15, 2009, by Dr. Katrinka Blazing.  He was successfully converted to  normal sinus rhythm.  He was continued on a low-dose beta blocker as  well as amiodarone.  Since that time, he has continued to maintain sinus  rhythm and blood pressures have been stable.  He has had mild volume  overload postoperatively and has been started on Lasix, to which, he is  responding well.  At the time of this dictation, he remains 1 kg above  his preoperative weight with some mild lower extremity edema on physical  exam.  He is ambulating in the halls without difficulty.  He is  tolerating regular diet and is having normal bowel and bladder function.  He has also been noted to have some hyperglycemia perioperatively and  has been treated conservatively with a carbohydrate modified diet and  sliding scale insulin as needed.  His preoperative hemoglobin A1c is  6.4, which will need to be monitored as an outpatient.  Overall, he is  doing well on postop day 7.  His incisions are all healing well.  He has  remained afebrile and his vital signs are stable and he is maintaining  normal sinus rhythm.  His incisions are healing well.  His most recent  labs show a sodium of 131, potassium 4.7, BUN 24, creatinine 1.37.  Hemoglobin 8.8, hematocrit 25.8, white count 12.8, platelets 161.  His  anemia has been stable and he has been treated with iron supplementation  and is doing well.  After morning round evaluation on April 16, 2009,  it is felt that he may be discharged home.   DISCHARGE MEDICATIONS:  Please see discharge medication management in E-  chart.   DISCHARGE  INSTRUCTIONS:  He is asked to refrain from driving, heavy  lifting, or strenuous activity.  He may continue ambulating daily and  using his incentive spirometer.  He may shower daily and clean his  incisions with soap and water.  He will continue a low-fat, low-sodium,  carbohydrate modified diet.   DISCHARGE FOLLOWUP:  He will need to make an appointment to see Dr.  Katrinka Blazing in 2 weeks for recheck.  The [facility] office  will contact him with an  appointment to see Dr. Laneta Simmers in 3 weeks with a chest x-ray.  He will  also need to follow up with his primary care physician for further  workup of his blood sugars.  In the interim, if he experiences any  problems or has questions, he is asked to contact our office  immediately.      Coral Ceo, P.A.      Evelene Croon, M.D.  Electronically Signed    GC/MEDQ  D:  04/16/2009  T:  04/16/2009  Job:  [id number]   cc:   Lyn Records, M.D.

## 2011-01-19 NOTE — Cardiovascular Report (Signed)
NAMEKAYLUB, DETIENNE NO.:  [id number]   MEDICAL RECORD NO.:  [id number]          PATIENT TYPE:  OIB   LOCATION:  1961                         FACILITY:  [facility]   PHYSICIAN:  Lyn Records, M.D.   DATE OF BIRTH:  [birth year]   DATE OF PROCEDURE:  03/28/2009  DATE OF DISCHARGE:  03/28/2009                            CARDIAC CATHETERIZATION   INDICATIONS:  Class III functional status due to angina.  Recent  severely abnormal Cardiolite study demonstrating a vast region of  anterior ischemia.  There is also aortic stenosis by echo with a valve  area in the 1.0-1.2 sq. cm range.  On clinical exam, the patient does  have a systolic murmur compatible with aortic stenosis.  This study is  being done to define his coronary anatomy and reassess the hemodynamics  of the aortic valve.   PROCEDURE PERFORMED:  1. Left heart cath.  2. Right heart cath.  3. Coronary angiography.  4. Thermodilution cardiac output.  5. Left ventriculography by hand injection.   DESCRIPTION:  After informed consent and following 1 mg of Versed and 50  mcg of fentanyl, a 7-French venous sheath was placed in the right  femoral using the modified Seldinger technique.  A 5-French right  femoral arterial sheath was also placed using the Seldinger technique.   We performed right heart cath with a 6-French Swan-Ganz catheter.  Oximetry samples and thermodilution outputs were performed.  Left heart  catheterization was performed with a 5-French A2 multipurpose catheter.  This catheter was used for left coronary angiography.  We used a Judkins  right 5-French catheter to cross the aortic valve.  We did a hand  injection with this catheter.  A pullback pressure across the aortic  valve was recorded.  Right coronary angiography was performed with this  catheter as well.   The patient tolerated the procedure without complications.   Hemostasis was achieved with manual compression.   RESULTS:  1.  Hemodynamic data:      a.     Right atrial mean pressure 6 mmHg.      b.     Right ventricular pressure 32/8 mmHg.      c.     Pulmonary artery pressure 30/90 mmHg.      d.     Pulmonary capillary wedge mean pressure 10 mmHg.      e.     Aortic pressure 128/55 mmHg.      f.     Left ventricular pressure 146/21 mmHg.      g.     Aortic pressure measured at pullback 141/65 mmHg.      h.     Fick cardiac output 6 L per minute.      i.     Thermodilution cardiac output 4.9 L per minute.      j.     Aortic valve area by the Fick output 2.8 sq. cm and by the       thermodilution cardiac output 2.36 sq. cm.  Aortic valve gradient       used was the pullback  gradient which was peak-to-peak only       approximately 2.5 to 3 mmHg.  When the initial aortic pressure is       compared to the initial LV pressure, there is an 18-mm peak-to-       peak gradient.  This was not used for the purpose of calculation.  2. Left ventriculography:  The left ventricle is not well opacified.      The anterior wall is mildly hypokinetic.  No significant mitral      regurgitation is noted.  EF is greater than 50%.  3. Coronary angiography:  All 3 coronary territories are heavily      calcified.      a.     Left main coronary:  The left main contains significant       distal diffuse stenosis of 50-60%.      b.     Left anterior descending coronary:  The LAD contains an       ostial eccentric greater than 85% stenosis proximally.  The vessel       is heavily calcified.  It gives origin to a large branching       diagonal.  The ostium of the diagonal was never well seen.  The       mid diagonal is totally occluded with sluggish collaterals that       reconstitute the mid to distal vessel.  The LAD beyond the mid       segment contains diffuse disease up to 70% before it wraps around       the left ventricular apex.      c.     Circumflex artery:  This is a large codominant vessel that       gives origin to a large  third obtuse marginal branch contains 70-       80% proximal stenosis.  The proximal portion of the circumflex       near the left main contains a tubular 70% narrowing.      d.     Right coronary:  The right coronary is a relatively small       nondominant to codominant vessel that contains high-grade mid and       distal disease before the acute marginal branch and the PDA that       are graded at 95 and 95% respectively.   CONCLUSIONS:  1. Mild-to-moderate aortic stenosis by hemodynamic data.  2. Severe multivessel coronary disease including left main.  3. Mild left ventricular dysfunction with anteroapical hypokinesis.  4. Normal right heart pressures.   PLAN:  Referral for coronary artery bypass grafting.  I believe we  should relook at the patient's aortic valve here by echo in [place]  to make a final decision about whether any treatment is necessary on the  valve.  The hemodynamic data from cath would suggest that aortic valve  surgery would not be indicated at this time.      Lyn Records, M.D.  Electronically Signed     HWS/MEDQ  D:  03/28/2009  T:  03/29/2009  Job:  [id number]   cc:   Kizzie Bane. Mohiuddin, MD  [facility] Office.

## 2011-01-19 NOTE — Assessment & Plan Note (Signed)
OFFICE VISIT   DELAN, KSIAZEK  DOB:  26-Feb-1941                                        June 17, 2009  CHART #:  [id number]   The patient returns today for follow up of aortic insufficiency that  developed postoperatively after the aortic valve replacement with a 23-  mm Edwards pericardial valve and coronary bypass surgery x3 on  04/09/2009.  He was initially seen back in the office by Dr. Katrinka Blazing on  August 25 and was noted to have a new murmur suggesting aortic  insufficiency.  An echocardiogram was performed which reportedly showed  mild perivalvular aortic insufficiency as well as some insufficiency  through the valve itself.  The patient had some symptoms suggesting  orthopnea and peripheral edema at that time and was treated with  diuretics.  According to Dr. Katrinka Blazing, the patient has required increased  dose of diuretic to manage his congestive heart failure.  He is  currently taking 80 mg per day.  The patient said that he still has  occasional episodes of shortness of breath when lying flat in bed,  improved with sitting upright.  These do not happen every day and are  somewhat unpredictable.  He has not had any shortness of breath with  exertion such as walking or going up and down stairs.  He has had no  chest pain.   PHYSICAL EXAMINATION:  Vital Signs:  Today, his blood pressure is  137/68, pulse is 56 and regular, respiratory rate is 18 and unlabored.  Oxygen saturation on room air is 98%.  General:  He looks well.  Cardiac:  A regular rate and rhythm with a grade 1-2/6 systolic murmur  over the aorta and a grade 2/6 decrescendo aortic insufficiency murmur.  Lungs:  Clear.  Chest:  The chest incision is well-healed.  Extremities:  There is moderate bilateral lower extremity edema to the knees.   His medications are Lasix 80 mg daily, potassium chloride, Lopressor 25  mg b.i.d., amiodarone 200 mg b.i.d., aspirin 325 mg daily, and Zocor 20  mg  at bedtime.   IMPRESSION:  The patient has an aortic insufficiency murmur  postoperatively with echocardiographic evidence of periprosthetic leak  as well as some insufficiency through the valve.  This was felt to be  mild by 2-D echocardiogram at the end of August.  Despite this finding  of mild insufficiency by 2-D echocardiogram, the patient has continued  to have congestive heart failure signs and symptoms and is currently on  Lasix 80 mg per day with significant bilateral lower extremity edema.  I  am concerned about the possibility that his aortic insufficiency may be  greater than initially noticed on 2-D echocardiogram.  I think it would  be best to proceed with a transesophageal echocardiogram to try to more  clearly define the aortic insufficiency and whether it is coming from  perivalvular leak or through the valve itself or both and to try to  quantitate the amount of insufficiency.  It may also help with  evaluating the left ventricular function and cavity size.  I discussed  the indications for this procedure with the patient and his wife and  son.  I discussed my impression that there was a probable perivalvular  and possible transvalvular leak that may require further intervention.  I told them  I would like to get this echocardiogram done first before  making any further decisions regarding treatment.  Dr. Katrinka Blazing will  schedule this in the near future and then I will see the patient back to  discuss the results and decide in what direction to proceed.   Evelene Croon, M.D.  Electronically Signed   BB/MEDQ  D:  06/17/2009  T:  06/18/2009  Job:  [id number]

## 2011-11-29 ENCOUNTER — Inpatient Hospital Stay (HOSPITAL_COMMUNITY)
Admission: AD | Admit: 2011-11-29 | Discharge: 2011-12-01 | DRG: 308 | Disposition: A | Discharge status: Home / Self Care | Payer: Medicare Other | Source: Ambulatory Visit | Attending: Interventional Cardiology | Admitting: Interventional Cardiology

## 2011-11-29 ENCOUNTER — Encounter (HOSPITAL_COMMUNITY): Payer: Self-pay

## 2011-11-29 DIAGNOSIS — I4892 Unspecified atrial flutter: Principal | ICD-10-CM | POA: Diagnosis present

## 2011-11-29 DIAGNOSIS — I10 Essential (primary) hypertension: Secondary | ICD-10-CM | POA: Diagnosis present

## 2011-11-29 DIAGNOSIS — Z79899 Other long term (current) drug therapy: Secondary | ICD-10-CM

## 2011-11-29 DIAGNOSIS — E669 Obesity, unspecified: Secondary | ICD-10-CM | POA: Diagnosis present

## 2011-11-29 DIAGNOSIS — Z953 Presence of xenogenic heart valve: Secondary | ICD-10-CM

## 2011-11-29 DIAGNOSIS — I2510 Atherosclerotic heart disease of native coronary artery without angina pectoris: Secondary | ICD-10-CM | POA: Diagnosis present

## 2011-11-29 DIAGNOSIS — I25810 Atherosclerosis of coronary artery bypass graft(s) without angina pectoris: Secondary | ICD-10-CM

## 2011-11-29 DIAGNOSIS — Z951 Presence of aortocoronary bypass graft: Secondary | ICD-10-CM

## 2011-11-29 DIAGNOSIS — I509 Heart failure, unspecified: Secondary | ICD-10-CM | POA: Diagnosis present

## 2011-11-29 DIAGNOSIS — I441 Atrioventricular block, second degree: Secondary | ICD-10-CM | POA: Diagnosis present

## 2011-11-29 DIAGNOSIS — Z6832 Body mass index (BMI) 32.0-32.9, adult: Secondary | ICD-10-CM

## 2011-11-29 DIAGNOSIS — Z87891 Personal history of nicotine dependence: Secondary | ICD-10-CM

## 2011-11-29 DIAGNOSIS — I739 Peripheral vascular disease, unspecified: Secondary | ICD-10-CM | POA: Diagnosis present

## 2011-11-29 DIAGNOSIS — Y831 Surgical operation with implant of artificial internal device as the cause of abnormal reaction of the patient, or of later complication, without mention of misadventure at the time of the procedure: Secondary | ICD-10-CM | POA: Diagnosis present

## 2011-11-29 DIAGNOSIS — I248 Other forms of acute ischemic heart disease: Secondary | ICD-10-CM | POA: Diagnosis present

## 2011-11-29 DIAGNOSIS — Z7901 Long term (current) use of anticoagulants: Secondary | ICD-10-CM

## 2011-11-29 DIAGNOSIS — T8209XA Other mechanical complication of heart valve prosthesis, initial encounter: Secondary | ICD-10-CM

## 2011-11-29 DIAGNOSIS — I359 Nonrheumatic aortic valve disorder, unspecified: Secondary | ICD-10-CM | POA: Diagnosis present

## 2011-11-29 DIAGNOSIS — I2489 Other forms of acute ischemic heart disease: Secondary | ICD-10-CM | POA: Diagnosis present

## 2011-11-29 DIAGNOSIS — T82897A Other specified complication of cardiac prosthetic devices, implants and grafts, initial encounter: Secondary | ICD-10-CM | POA: Diagnosis present

## 2011-11-29 DIAGNOSIS — I5033 Acute on chronic diastolic (congestive) heart failure: Secondary | ICD-10-CM | POA: Diagnosis present

## 2011-11-29 DIAGNOSIS — E785 Hyperlipidemia, unspecified: Secondary | ICD-10-CM | POA: Diagnosis present

## 2011-11-29 HISTORY — DX: Angina pectoris, unspecified: I20.9

## 2011-11-29 HISTORY — DX: Atherosclerotic heart disease of native coronary artery without angina pectoris: I25.10

## 2011-11-29 HISTORY — DX: Essential (primary) hypertension: I10

## 2011-11-29 LAB — DIFFERENTIAL
Basophils Absolute: 0.1 K/uL (ref 0.0–0.1)
Basophils Relative: 1 % (ref 0–1)
Eosinophils Absolute: 0.2 K/uL (ref 0.0–0.7)
Eosinophils Relative: 2 % (ref 0–5)
Lymphocytes Relative: 25 % (ref 12–46)
Lymphs Abs: 2.9 K/uL (ref 0.7–4.0)
Monocytes Absolute: 0.7 K/uL (ref 0.1–1.0)
Monocytes Relative: 6 % (ref 3–12)
Neutro Abs: 7.8 K/uL — ABNORMAL HIGH (ref 1.7–7.7)
Neutrophils Relative %: 67 % (ref 43–77)

## 2011-11-29 LAB — COMPREHENSIVE METABOLIC PANEL
AST: 37 U/L (ref 0–37)
Albumin: 4.1 g/dL (ref 3.5–5.2)
Calcium: 9.8 mg/dL (ref 8.4–10.5)
Creatinine, Ser: 1.11 mg/dL (ref 0.50–1.35)

## 2011-11-29 LAB — PROTIME-INR: Prothrombin Time: 14 seconds (ref 11.6–15.2)

## 2011-11-29 LAB — CARDIAC PANEL(CRET KIN+CKTOT+MB+TROPI)
CK, MB: 4.1 ng/mL — ABNORMAL HIGH (ref 0.3–4.0)
CK, MB: 4.5 ng/mL — ABNORMAL HIGH (ref 0.3–4.0)
Relative Index: 2.7 — ABNORMAL HIGH (ref 0.0–2.5)
Total CK: 154 U/L (ref 7–232)
Total CK: 141 U/L (ref 7–232)
Troponin I: <0.3 ng/mL (ref <0.30)

## 2011-11-29 LAB — CBC
HCT: 46.9 % (ref 39.0–52.0)
Hemoglobin: 16.1 g/dL (ref 13.0–17.0)
MCH: 31.9 pg (ref 26.0–34.0)
MCHC: 34.3 g/dL (ref 30.0–36.0)
MCV: 92.9 fL (ref 78.0–100.0)
Platelets: 245 K/uL (ref 150–400)
RBC: 5.05 MIL/uL (ref 4.22–5.81)
RDW: 12.8 % (ref 11.5–15.5)
WBC: 11.8 K/uL — ABNORMAL HIGH (ref 4.0–10.5)

## 2011-11-29 LAB — MRSA PCR SCREENING: MRSA by PCR: NEGATIVE

## 2011-11-29 LAB — APTT: aPTT: 27 s (ref 24–37)

## 2011-11-29 LAB — TSH: TSH: 1.121 u[IU]/mL (ref 0.350–4.500)

## 2011-11-29 MED ORDER — HEPARIN (PORCINE) IN NACL 100-0.45 UNIT/ML-% IJ SOLN
1350.0000 [IU]/h | INTRAMUSCULAR | Status: AC
  Administered 2011-11-29: 1350 [IU]/h via INTRAVENOUS
  Filled 2011-11-29 (×4): qty 250

## 2011-11-29 MED ORDER — NITROGLYCERIN 0.4 MG SL SUBL: 0.4000 mg | SUBLINGUAL_TABLET | SUBLINGUAL | Status: DC | PRN

## 2011-11-29 MED ORDER — SODIUM CHLORIDE 0.9 % IV SOLN: INTRAVENOUS | Status: DC

## 2011-11-29 MED ORDER — ACETAMINOPHEN 325 MG PO TABS: 650.0000 mg | ORAL_TABLET | ORAL | Status: DC | PRN

## 2011-11-29 MED ORDER — AMIODARONE HCL IN DEXTROSE 360-4.14 MG/200ML-% IV SOLN
30.0000 mg/h | INTRAVENOUS | Status: DC
  Administered 2011-11-29 – 2011-11-30 (×2): 30 mg/h via INTRAVENOUS
  Filled 2011-11-29 (×8): qty 200

## 2011-11-29 MED ORDER — METOPROLOL TARTRATE 25 MG PO TABS
25.0000 mg | ORAL_TABLET | Freq: Two times a day (BID) | ORAL | Status: DC
  Administered 2011-11-29 (×2): 25 mg via ORAL
  Filled 2011-11-29 (×4): qty 1

## 2011-11-29 MED ORDER — ASPIRIN EC 81 MG PO TBEC
81.0000 mg | DELAYED_RELEASE_TABLET | Freq: Every day | ORAL | Status: DC
  Administered 2011-11-30 – 2011-12-01 (×2): 81 mg via ORAL
  Filled 2011-11-29 (×2): qty 1

## 2011-11-29 MED ORDER — DEXTROSE 5 % IV SOLN
150.0000 mg | Freq: Once | INTRAVENOUS | Status: DC
  Filled 2011-11-29: qty 3

## 2011-11-29 MED ORDER — HEPARIN BOLUS VIA INFUSION
4000.0000 [IU] | Freq: Once | INTRAVENOUS | Status: AC
  Administered 2011-11-29: 4000 [IU] via INTRAVENOUS
  Filled 2011-11-29: qty 4000

## 2011-11-29 MED ORDER — ONDANSETRON HCL 4 MG/2ML IJ SOLN: 4.0000 mg | Freq: Four times a day (QID) | INTRAMUSCULAR | Status: DC | PRN

## 2011-11-29 MED ORDER — POTASSIUM CHLORIDE CRYS ER 20 MEQ PO TBCR
20.0000 meq | EXTENDED_RELEASE_TABLET | Freq: Two times a day (BID) | ORAL | Status: DC
  Administered 2011-11-29 – 2011-12-01 (×4): 20 meq via ORAL
  Filled 2011-11-29 (×5): qty 1

## 2011-11-29 MED ORDER — ATORVASTATIN CALCIUM 40 MG PO TABS
40.0000 mg | ORAL_TABLET | Freq: Every day | ORAL | Status: DC
  Administered 2011-11-29 – 2011-11-30 (×2): 40 mg via ORAL
  Filled 2011-11-29 (×3): qty 1

## 2011-11-29 MED ORDER — AMIODARONE LOAD VIA INFUSION
150.0000 mg | Freq: Once | INTRAVENOUS | Status: AC
  Administered 2011-11-29: 150 mg via INTRAVENOUS
  Filled 2011-11-29: qty 83.34

## 2011-11-29 MED ORDER — FUROSEMIDE 80 MG PO TABS
80.0000 mg | ORAL_TABLET | Freq: Every day | ORAL | Status: DC
  Administered 2011-11-29 – 2011-12-01 (×3): 80 mg via ORAL
  Filled 2011-11-29 (×3): qty 1

## 2011-11-29 MED ORDER — DEXTROSE 5 % IV SOLN
1.0000 mg/min | INTRAVENOUS | Status: DC
  Administered 2011-11-29: 1 mg/min via INTRAVENOUS
  Filled 2011-11-29: qty 9

## 2011-11-29 MED ORDER — AMIODARONE HCL IN DEXTROSE 360-4.14 MG/200ML-% IV SOLN
60.0000 mg/h | INTRAVENOUS | Status: DC
  Administered 2011-11-29 (×2): 60 mg/h via INTRAVENOUS
  Filled 2011-11-29 (×2): qty 200

## 2011-11-29 MED ORDER — ALPRAZOLAM 0.25 MG PO TABS: 0.2500 mg | ORAL_TABLET | Freq: Two times a day (BID) | ORAL | Status: DC | PRN

## 2011-11-29 NOTE — Progress Notes (Signed)
ANTICOAGULATION CONSULT NOTE - Initial Consult  Pharmacy Consult for heparin Indication: aflutter  Allergies not on file  Patient Measurements: Ht: 5' 11' Wt:: 106kg IBW=75.3 Heparin Dosing Weight: 97kg     Labs: No results found for this basename: HGB:2,HCT:3,PLT:3,APTT:3,LABPROT:3,INR:3,HEPARINUNFRC:3,CREATININE:3,CKTOTAL:3,CKMB:3,TROPONINI:3 in the last 72 hours CrCl is unknown because there is no height on file for the current visit.  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   Goal of Therapy:  Heparin level 0.3-0.7 units/ml   Plan:  -Heparin 4000 unit bolus followed by 1350 units /hr (~14 units/kg/hr) -Heparin level in 6 hrs and daily with CBC daily   Benny Lennert 11/29/2011,12:42 PM

## 2011-11-29 NOTE — Progress Notes (Signed)
ANTICOAGULATION CONSULT NOTE - Follow Up Consult  Pharmacy Consult for Heparin Indication: Aflutter  No Known Allergies  Patient Measurements: Height: 5\' 11"  (180.3 cm) Weight: 233 lb 11 oz (106 kg) IBW/kg (Calculated) : 75.3  Heparin Dosing Weight: 97kg  Vital Signs: Temp: 98.5 F (36.9 C) (03/25 2000) Temp src: Oral (03/25 2000) BP: 122/63 mmHg (03/25 2100) Pulse Rate: 103  (03/25 1643)  Labs:  Basename 11/29/11 2054 11/29/11 1808 11/29/11 1255 11/29/11 1240  HGB -- -- 16.1 --  HCT -- -- 46.9 --  PLT -- -- 245 --  APTT -- -- 27 --  LABPROT -- -- 14.0 --  INR -- -- 1.06 --  HEPARINUNFRC 0.66 -- -- --  CREATININE -- -- 1.11 --  CKTOTAL -- 141 -- 154  CKMB -- 4.5* -- 4.1*  TROPONINI -- 0.36* -- <0.30   Estimated Creatinine Clearance: 77.8 ml/min (by C-G formula based on Cr of 1.11).   Medications:  Heparin 1350 units/hr (13.5 ml/hr)  Assessment: 70yom on heparin for aflutter with RVR. Heparin level (0.66) is therapeutic.  - H/H and Plts wnl - No significant bleeding reported  Goal of Therapy:  Heparin level 0.3-0.7 units/ml   Plan:  1. Continue heparin 1350 units/hr (13.5 ml/hr) 2. Follow-up AM heparin level and CBC  Cleon Dew [phone] 11/29/2011,9:49 PM

## 2011-11-29 NOTE — Plan of Care (Signed)
Problem: Phase I Progression Outcomes Goal: Anticoagulation Therapy per MD order Outcome: Completed/Met Date Met:  11/29/11 Heparin per pharmacy Goal: Heart rate or rhythm control medication Outcome: Completed/Met Date Met:  11/29/11 Amioadarone gtt Goal: Initial discharge plan identified Outcome: Completed/Met Date Met:  11/29/11 Plans to return home with wife

## 2011-11-29 NOTE — H&P (Signed)
Office Visit     Patient: [name] [name], [name] [name] Provider: Verdis Prime, MD  DOB: 1941-01-22 Age: 71 Y Sex: Male Date: 11/29/2011  Phone: [phone]   Address: [address] [address], [place], [id number]       Subjective:     CC:    1. HS/per Dr. Arbutus Leas.        HPI:  General:  Developed sudden dyspnea and orthopnea last evening. There was also mild chest tightness. He had polyuria. He did not have syncope. He denies palpitations. He has not had this type complaints in the past. He has not had edema. Also the symptoms was acute. By this morning he was somewhat better although he still noticed dyspnea on exertion and weakness. The office an EKG demonstrated tachycardia and we believe to be atrial flutter with 2 to one block..        ROS:  CONSTITUTIONAL:  Patient denies chills, fatigue, fever, insomnia, night sweats, and anorexia.  CARDIOLOGY:  no Chest tightness. no Claudication. no Cyanosis. no Dyspnea on exertion. no Edema. no Fatigue. no Irregular heart beat. no Murmurs. no Near Syncope. no Orthopnea. no Palpitations. no PND (paroxsymal nocturnal dyspnea). Signs of GI bleeding None. Snoring and/or insomnia None. Syncope no. Transient neurological symptoms None. Visual changes none.  RESPIRATORY:  Patient denies DOE (dyspnea on exertion), cough, blood-tinged sputum, hemoptysis, pain with breathing , wheezing.  GASTROENTEROLOGY:  Patient denies acid reflux, black stools, blood in stool, diarrhea, clay colored stools, dysphagia.  MUSCULOSKELETAL:  Patient denies back pain, carpal tunnel, joint stiffness, joint swelling.  NEUROLOGY:  Patient denies headache, insomnia, confusion, gait abnormality, paralysis, paresthesias, seizures, transient neurologiacal deficits..  PSYCHOLOGY:  Patient denies anxiety, mania, memory loss, nervousness, nightmares .         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        Family History: Non-Contributory There is no history of premature coronary atherosclerosis.       Social History:  General:  History of smoking cigarettes: Former smoker, Quit in year 1980.  no Smoking.  no Tobacco Exposure.  Alcohol: yes, social, Daily.  Exercise: yes, walks.  Occupation: employed.  Education: yes.  Marital Status: married.  Children: 2, Boys.        Medications: Lasix 80 MG Tablet 1 tablet qd, Aspirin 325 MG Tablet Chewable 1 tablet Once a day, Amoxicillin 500 MG Capsule 4 capsules an hour prior to dental appt, Omega 3 1000 MG Capsule 3 tablets daily, OTC nite works,snack defense,malefactor,rosegard as directed at night, MVI as directed daily, Klor-Con M20 20 MEQ Tablet Extended Release TAKE 1 TABLET TWICE A DAY , Metoprolol Tartrate 25 MG Tablet TAKE 1 TABLET BY MOUTH TWICE A DAY , Crestor 20 MG Tablet 1 tablet Once a day, Medication List reviewed and reconciled with the patient       Allergies: N.K.D.A.      Objective:     Vitals: Wt 239.2, Wt change 1.4 lb, Ht 69.25, BMI 35.07, Pulse sitting 120 apical, BP sitting 134/76.       Examination:  Cardiology Exam:  GENERAL APPEARANCE: pleasant, NAD, comfortable, obese, elderly, male.  HEENT: normal.  CAROTID UPSTROKE: no bruit, upstrokes intact.  JVD: flat.  HEART: tachycardia with regular rhythm, normal S1S2, no rub, no gallop, or click.  HEART MURMUR: grade 1/6, systolic ejection and , grade 4-0/9, diastolic, LLSB c/w AR.  LUNGS: clear to auscultation, no wheezing/rhonchi/rales.  ABDOMEN: soft, non-tender, no hepatomegaly, no masses palpated.  EXTREMITIES: Bilateral trace leg edema. Bilateral femoral bruit., R louder than left . Fem pulse 1plus bilat.Marland Kitchen    PERIPHERAL PULSES: absent in pop., PT, and DP bilat.Marland Kitchen  NEUROLOGIC: grossly intact, cranial nerves intact, gait WNL.  MOOD: normal.        Assessment:     Assessment:  1. Atrial flutter with rapid ventricular response - 427.32 (Primary)  2. Chest pain - 786.50  3. Acute on chronic diastolic heart failure - 428.33, due to rate  4. 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 Coronary atherosclerosis of native coronary artery - 414.01, chest pain may be demand related  6. Claudication - 443.9, stable  7. Hypertension, essential - 401.1, controlled.    Plan:     1. Atrial flutter with rapid ventricular response  Admit to hospital. IV heparin. IV amiodarone. Electrical cardioversion is he does not convert.       2. Chest pain  Diagnostic Imaging:EKG A flutter with v rate 118, Boehler,Eileen 11/29/2011 09:26:39 AM > Jowana Thumma 11/29/2011 09:46:02 AM >  Rule out MI with serial markers and EKGs.       3. Acute on chronic diastolic heart failure  May need additional Lasix.       4. Aortic prosthetic valve regurgitation  We will need to repeat his echocardiogram to rule out worsening of aortic regurgitation from perivalvular leak.        Immunizations:        Labs:        Procedure Codes: 81191 EKG I AND R       Preventive:        Provider: Verdis Prime, MD  Patient: [name] [name], [name] [name] DOB: 02-09-1941 Date: 11/29/2011

## 2011-11-30 ENCOUNTER — Encounter (HOSPITAL_COMMUNITY): Payer: Self-pay | Admitting: Anesthesiology

## 2011-11-30 ENCOUNTER — Inpatient Hospital Stay (HOSPITAL_COMMUNITY): Payer: Medicare Other

## 2011-11-30 ENCOUNTER — Encounter (HOSPITAL_COMMUNITY)
Admission: AD | Disposition: A | Discharge status: Home / Self Care | Payer: Self-pay | Source: Ambulatory Visit | Attending: Interventional Cardiology

## 2011-11-30 ENCOUNTER — Other Ambulatory Visit: Payer: Self-pay

## 2011-11-30 ENCOUNTER — Inpatient Hospital Stay (HOSPITAL_COMMUNITY): Payer: Medicare Other | Admitting: Anesthesiology

## 2011-11-30 ENCOUNTER — Encounter (HOSPITAL_COMMUNITY): Payer: Self-pay | Admitting: Cardiology

## 2011-11-30 DIAGNOSIS — I4892 Unspecified atrial flutter: Principal | ICD-10-CM

## 2011-11-30 HISTORY — PX: TEE WITHOUT CARDIOVERSION: SHX5443

## 2011-11-30 LAB — CBC
HCT: 44.8 % (ref 39.0–52.0)
Hemoglobin: 15 g/dL (ref 13.0–17.0)
MCH: 31.4 pg (ref 26.0–34.0)
MCHC: 33.5 g/dL (ref 30.0–36.0)

## 2011-11-30 LAB — HEMOGLOBIN A1C
Hgb A1c MFr Bld: 7 % — ABNORMAL HIGH (ref <5.7)
Mean Plasma Glucose: 154 mg/dL — ABNORMAL HIGH (ref <117)

## 2011-11-30 SURGERY — ECHOCARDIOGRAM, TRANSESOPHAGEAL
Anesthesia: Moderate Sedation

## 2011-11-30 MED ORDER — SODIUM CHLORIDE 0.45 % IV SOLN: INTRAVENOUS | Status: DC

## 2011-11-30 MED ORDER — PROPOFOL 10 MG/ML IV BOLUS
INTRAVENOUS | Status: DC | PRN
  Administered 2011-11-30: 100 mg via INTRAVENOUS

## 2011-11-30 MED ORDER — MIDAZOLAM HCL 10 MG/2ML IJ SOLN
INTRAMUSCULAR | Status: AC
  Filled 2011-11-30: qty 2

## 2011-11-30 MED ORDER — METOPROLOL TARTRATE 50 MG PO TABS
50.0000 mg | ORAL_TABLET | Freq: Two times a day (BID) | ORAL | Status: DC
  Administered 2011-11-30 – 2011-12-01 (×3): 50 mg via ORAL
  Filled 2011-11-30 (×4): qty 1

## 2011-11-30 MED ORDER — FENTANYL CITRATE 0.05 MG/ML IJ SOLN
INTRAMUSCULAR | Status: DC | PRN
  Administered 2011-11-30 (×2): 25 ug via INTRAVENOUS

## 2011-11-30 MED ORDER — SODIUM CHLORIDE 0.9 % IV SOLN
INTRAVENOUS | Status: DC | PRN
  Administered 2011-11-30: 19:00:00 via INTRAVENOUS

## 2011-11-30 MED ORDER — LIDOCAINE HCL 2 % EX GEL
CUTANEOUS | Status: AC
  Filled 2011-11-30: qty 5

## 2011-11-30 MED ORDER — RIVAROXABAN 10 MG PO TABS
20.0000 mg | ORAL_TABLET | Freq: Every day | ORAL | Status: DC
  Administered 2011-11-30 – 2011-12-01 (×2): 20 mg via ORAL
  Filled 2011-11-30 (×2): qty 2

## 2011-11-30 MED ORDER — SODIUM CHLORIDE 0.9 % IJ SOLN
3.0000 mL | Freq: Two times a day (BID) | INTRAMUSCULAR | Status: DC
  Administered 2011-11-30 – 2011-12-01 (×2): 3 mL via INTRAVENOUS

## 2011-11-30 MED ORDER — SODIUM CHLORIDE 0.9 % IJ SOLN: 3.0000 mL | INTRAMUSCULAR | Status: DC | PRN

## 2011-11-30 MED ORDER — SODIUM CHLORIDE 0.9 % IV SOLN
2.0000 g | Freq: Once | INTRAVENOUS | Status: AC
  Administered 2011-11-30: 2 g via INTRAVENOUS
  Filled 2011-11-30 (×2): qty 2000

## 2011-11-30 MED ORDER — SODIUM CHLORIDE 0.9 % IV SOLN: 250.0000 mL | INTRAVENOUS | Status: DC | PRN

## 2011-11-30 MED ORDER — MIDAZOLAM HCL 10 MG/2ML IJ SOLN
INTRAMUSCULAR | Status: DC | PRN
  Administered 2011-11-30 (×3): 1 mg via INTRAVENOUS

## 2011-11-30 MED ORDER — FENTANYL CITRATE 0.05 MG/ML IJ SOLN
INTRAMUSCULAR | Status: AC
  Filled 2011-11-30: qty 2

## 2011-11-30 MED ORDER — LIDOCAINE VISCOUS 2 % MT SOLN
OROMUCOSAL | Status: AC
  Filled 2011-11-30: qty 15

## 2011-11-30 MED ORDER — SODIUM CHLORIDE 0.9 % IV SOLN
INTRAVENOUS | Status: DC
  Administered 2011-11-30: 500 mL via INTRAVENOUS

## 2011-11-30 MED ORDER — BUTAMBEN-TETRACAINE-BENZOCAINE 2-2-14 % EX AERO
INHALATION_SPRAY | CUTANEOUS | Status: DC | PRN
  Administered 2011-11-30: 2 via TOPICAL

## 2011-11-30 MED ORDER — DIPHENHYDRAMINE HCL 50 MG/ML IJ SOLN
INTRAMUSCULAR | Status: AC
  Filled 2011-11-30: qty 1

## 2011-11-30 MED ORDER — LIDOCAINE VISCOUS 2 % MT SOLN
OROMUCOSAL | Status: DC | PRN
  Administered 2011-11-30: 5 mL via OROMUCOSAL

## 2011-11-30 NOTE — Transfer of Care (Signed)
Immediate Anesthesia Transfer of Care Note  Patient: [name] [name]  Procedure(s) Performed: * No procedures listed *  Patient Location: ICU  Anesthesia Type: General  Level of Consciousness: alert  and oriented  Airway & Oxygen Therapy: Patient Spontanous Breathing and Patient connected to nasal cannula oxygen  Post-op Assessment: Report given to PACU RN and Post -op Vital signs reviewed and stable  Post vital signs: Reviewed and stable  Complications: No apparent anesthesia complications

## 2011-11-30 NOTE — Progress Notes (Signed)
ANTICOAGULATION CONSULT NOTE - Follow Up Consult  Pharmacy Consult for Heparin Indication: Aflutter  No Known Allergies  Patient Measurements: Height: 5\' 11"  (180.3 cm) Weight: 233 lb 11 oz (106 kg) IBW/kg (Calculated) : 75.3  Heparin Dosing Weight: 97kg  Vital Signs: Temp: 98.1 F (36.7 C) (03/26 0851) Temp src: Oral (03/26 0851) BP: 136/67 mmHg (03/26 0445)  Labs:  Alvira Philips 11/30/11 0527 11/30/11 0031 11/29/11 2054 11/29/11 1808 11/29/11 1255 11/29/11 1240  HGB 15.0 -- -- -- 16.1 --  HCT 44.8 -- -- -- 46.9 --  PLT 217 -- -- -- 245 --  APTT -- -- -- -- 27 --  LABPROT -- -- -- -- 14.0 --  INR -- -- -- -- 1.06 --  HEPARINUNFRC 0.67 -- 0.66 -- -- --  CREATININE -- -- -- -- 1.11 --  CKTOTAL -- 123 -- 141 -- 154  CKMB -- 3.4 -- 4.5* -- 4.1*  TROPONINI -- 0.45* -- 0.36* -- <0.30   Estimated Creatinine Clearance: 77.8 ml/min (by C-G formula based on Cr of 1.11).   Medications:  Heparin 1350 units/hr (13.5 ml/hr)  Assessment: 70yom on heparin for aflutter with RVR. Heparin level (0.67) is therapeutic.  - H/H and Plts wnl - No significant bleeding reported  Goal of Therapy:  Heparin level 0.3-0.7 units/ml   Plan:  1. Continue heparin 1350 units/hr (13.5 ml/hr) 2. Follow-up AM heparin level and CBC  Benny Lennert 11/30/2011,9:15 AM

## 2011-11-30 NOTE — Anesthesia Preprocedure Evaluation (Signed)
Anesthesia Evaluation  Patient identified by MRN, date of birth, ID band Patient awake    Reviewed: Allergy & Precautions, H&P , NPO status , Patient's Chart, lab work & pertinent test results, reviewed documented beta blocker date and time   History of Anesthesia Complications Negative for: history of anesthetic complications  Airway Mallampati: III TM Distance: >3 FB Neck ROM: Full    Dental  (+) Edentulous Upper, Partial Lower and Dental Advisory Given   Pulmonary neg pulmonary ROS,  breath sounds clear to auscultation  Pulmonary exam normal       Cardiovascular hypertension, Pt. on medications and Pt. on home beta blockers + angina + CAD + dysrhythmias (TEE today:  mod AI, mod LV dysfunction EF 40%) Atrial Fibrillation + Valvular Problems/Murmurs AI Rhythm:Irregular Rate:Normal + Systolic murmurs and + Diastolic murmurs    Neuro/Psych negative neurological ROS     GI/Hepatic negative GI ROS, Neg liver ROS,   Endo/Other  negative endocrine ROSMorbid obesity  Renal/GU negative Renal ROS     Musculoskeletal   Abdominal (+) + obese,   Peds  Hematology   Anesthesia Other Findings   Reproductive/Obstetrics                           Anesthesia Physical Anesthesia Plan  ASA: III  Anesthesia Plan: General   Post-op Pain Management:    Induction: Intravenous  Airway Management Planned: Mask  Additional Equipment:   Intra-op Plan:   Post-operative Plan:   Informed Consent: I have reviewed the patients History and Physical, chart, labs and discussed the procedure including the risks, benefits and alternatives for the proposed anesthesia with the patient or authorized representative who has indicated his/her understanding and acceptance.   Dental advisory given  Plan Discussed with: CRNA and Surgeon  Anesthesia Plan Comments: (Plan routine monitors, GA for cardioversion  )         Anesthesia Quick Evaluation

## 2011-11-30 NOTE — Progress Notes (Signed)
Patient Name: [name] [name] Date of Encounter: 11/30/2011    SUBJECTIVE:Still in a tachycardia at 116 bpm. He denies dyspnea and chest pain despite elevated TI.  TELEMETRY:  Tachycardia, likely Flutter.: Filed Vitals:   11/29/11 2100 11/29/11 2225 11/30/11 0000 11/30/11 0445  BP: 122/63 121/69 124/64 136/67  Pulse:      Temp:   98.6 F (37 C) 98.4 F (36.9 C)  TempSrc:   Oral Oral  Resp: 18 16 16 18   Height:      Weight:      SpO2: 95%  96% 95%    Intake/Output Summary (Last 24 hours) at 11/30/11 0732 Last data filed at 11/30/11 0700  Gross per 24 hour  Intake 1598.63 ml  Output   2400 ml  Net -801.37 ml    LABS: Basic Metabolic Panel:  Basename 11/29/11 1255  NA 135  K 4.4  CL 96  CO2 26  GLUCOSE 121*  BUN 13  CREATININE 1.11  CALCIUM 9.8  MG --  PHOS --   CBC:  Basename 11/30/11 0527 11/29/11 1255  WBC 9.5 11.8*  NEUTROABS -- 7.8*  HGB 15.0 16.1  HCT 44.8 46.9  MCV 93.7 92.9  PLT 217 245   Cardiac Enzymes:  Basename 11/30/11 0031 11/29/11 1808 11/29/11 1240  CKTOTAL 123 141 154  CKMB 3.4 4.5* 4.1*  CKMBINDEX -- -- --  TROPONINI 0.45* 0.36* <0.30   BNP: No components found with this basename: POCBNP:3 Hemoglobin A1C:  Basename 11/29/11 1255  HGBA1C 7.0*    Radiology/Studies:  No data  Physical Exam: Blood pressure 136/67, pulse 103, temperature 98.4 F (36.9 C), temperature source Oral, resp. rate 18, height 5\' 11"  (1.803 m), weight 106 kg (233 lb 11 oz), SpO2 95.00%. Weight change:   Tachy with 2/6 systolic murmur and 3/6 diastolic murmur. Lungs clear No edema  ASSESSMENT: 1. SVT, likely atrial flutter vs other. 2. Trace elevation in Troponin 3. Prosthetic aortic valve with perivalvular leak. 4. Acute diastolic heart failure   Plan:  1. NPO 2. Start beta blocker 3. Possible electrical cardioversion 4. EP consult.  Selinda Eon 11/30/2011, 7:32 AM

## 2011-11-30 NOTE — Progress Notes (Signed)
Anesthesia and Dr. Mayford Knife are in for DCCV. Family was directed to the waiting room.  Pt tolerated procedure well.  He was converted to NSR with one shock with 150 joules.

## 2011-11-30 NOTE — Consult Note (Signed)
ELECTROPHYSIOLOGY CONSULT NOTE    Patient ID: [name] [name] MRN: [id number], DOB/AGE: [birth year] 70 y.o.  Admit date: 11/29/2011 Date of Consult: 11-30-2011  Primary Cardiologist: Lesleigh Noe, MD, MD  Reason for Consultation: atrial flutter  HPI:  [name] [name] is a pleasant 71 year old male with a past medical history significant for hypertension, AVR with bioprosthesis (04/2009) complicated by paravalvular leak/significant AR, CABG (LIMA --> LAD, SVG --> OM and SVG --> PDA), obesity, diastolic heart failure, and peripheral arterial disease.  He now presents with symptomatic atrial flutter.  Per Dr Burnard Hawthorne, he had atrial arrhythmias post AVR that required cardioversion but none since then.    He was in his usual state of health until 3-24 when he presented to Dr Michaelle Copas office with rather abrupt dyspnea and orthopnea.  He was not aware of any palpitations.  There was no edema or syncope.  EKG obtained in the office demonstrated atrial flutter and he was referred to [facility] for further evaluation.  He was placed on IV Heparin and IV Amiodarone which has resulted in slowing of his rate which varies between 80-110.  He was cardioverted earlier today by Dr Mayford Knife.  EP has been asked to evaluate for long term treatment options.   Past Medical History  Diagnosis Date  . Coronary artery disease   . Angina   . Hypertension   . Dysrhythmia   . Heart murmur   . Shortness of breath      Surgical History:  Past Surgical History  Procedure Date  . Coronary artery bypass graft   . Cardiac valve replacement      Prescriptions prior to admission  Medication Sig Dispense Refill  . aspirin 325 MG tablet Take 325 mg by mouth at bedtime.      . furosemide (LASIX) 20 MG tablet Take 20 mg by mouth daily.      . metoprolol tartrate (LOPRESSOR) 25 MG tablet Take 25 mg by mouth 2 (two) times daily.      Marland Kitchen omega-3 acid ethyl esters (LOVAZA) 1 G capsule Take 1 g by mouth 3 (three) times daily.        Marland Kitchen OVER THE COUNTER MEDICATION Take 1 tablet by mouth daily as needed. For allergies.  Over The Counter Allergy Supplement. [employer].      Marland Kitchen OVER THE COUNTER MEDICATION Take 1 tablet by mouth daily as needed. For allergies. Over The Counter Supplement. Schizandra      . potassium chloride SA (K-DUR,KLOR-CON) 20 MEQ tablet Take 40 mEq by mouth daily.      . rosuvastatin (CRESTOR) 20 MG tablet Take 20 mg by mouth at bedtime.        Inpatient Medications:    . amiodarone  150 mg Intravenous Once  . aspirin EC  81 mg Oral Daily  . atorvastatin  40 mg Oral q1800  . furosemide  80 mg Oral Daily  . heparin  4,000 Units Intravenous Once  . metoprolol tartrate  50 mg Oral BID  . potassium chloride  20 mEq Oral BID   Physical Exam: Filed Vitals:   11/30/11 1910 11/30/11 1915 11/30/11 1920 11/30/11 1925  BP: 143/59 123/53 123/52 114/55  Pulse: 65 65 64 66  Temp:      TempSrc:      Resp:      Height:      Weight:      SpO2: 100% 100% 99% 99%    GEN- The patient is well  appearing, alert and oriented x 3 today.   Head- normocephalic, atraumatic Eyes-  Sclera clear, conjunctiva pink Ears- hearing intact Oropharynx- clear Neck- supple, no JVP Lymph- no cervical lymphadenopathy Lungs- Clear to ausculation bilaterally, normal work of breathing Heart- Regular rate and rhythm, no murmurs, rubs or gallops, PMI not laterally displaced GI- soft, NT, ND, + BS Extremities- no clubbing, cyanosis, or edema   Allergies: No Known Allergies  History   Social History  . Marital Status: Married    Spouse Name: N/A    Number of Children: N/A  . Years of Education: N/A   Occupational History  . Not on file.   Social History Main Topics  . Smoking status: Current Everyday Smoker -- 0.2 packs/day for 10 years    Types: Cigarettes  . Smokeless tobacco: Former Neurosurgeon    Quit date: 11/29/1978  . Alcohol Use: 1.2 oz/week    2 Glasses of wine per week  . Drug Use:   . Sexually Active: Yes     Labs:   Lab Results  Component Value Date   WBC 9.5 11/30/2011   HGB 15.0 11/30/2011   HCT 44.8 11/30/2011   MCV 93.7 11/30/2011   PLT 217 11/30/2011    Lab 11/29/11 1255  NA 135  K 4.4  CL 96  CO2 26  BUN 13  CREATININE 1.11  CALCIUM 9.8  PROT 8.2  BILITOT 0.6  ALKPHOS 76  ALT 32  AST 37  GLUCOSE 121*   Lab Results  Component Value Date   CKTOTAL 123 11/30/2011   CKMB 3.4 11/30/2011   TROPONINI 0.45* 11/30/2011    Radiology/Studies: Dg Chest 2 View 11/30/2011  *RADIOLOGY REPORT*  Clinical Data: Evaluate for CHF  CHEST - 2 VIEW  Comparison: 05/06/2009  Findings: Heart size is normal.  No pleural effusion or edema.  Calcified granulomas identified within both lungs.  No airspace consolidation identified.  IMPRESSION:  1.  No active cardiopulmonary abnormalities. 2.  No evidence for heart failure.  Original Report Authenticated By: Rosealee Albee, M.D.    TELEMETRY: atrial flutter with variable ventricular conduction   Assessment/Impression/Plan:  The patient presents with atrial flutter.  Though his ekg is not clearly typical, this may be right atrial flutter.  Given his prior valvular surgery, I cannot exclude atypical atrial flutter. At this point, I would continue heparin gtt and oral anticoagulation with coumadin.  He may be a candidate for lovenox bridge.  We will continue IV amiodarone overnight and plan to convert to PO tomorrow.  I think that our options are long term amiodarone or catheter ablation.  I would favor catheter ablation in the long run.  I spoke briefly with him today about catheter ablation.  He would like to contemplate this further.   I would therefore plan to continue po amiodarone x 1-2 months.  At that point, if he chooses, we could bring him back for catheter ablation.  I will see him as needed while here.  I am happy to discuss catheter ablation with him further in the office. Please call with questions.  Fayrene Fearing Abbegale Stehle,MD 7:48  PM 11/30/2011

## 2011-11-30 NOTE — Procedures (Signed)
Electrical Cardioversion Procedure Note [name] [name] [id number] 30-May-1941  Procedure: Electrical Cardioversion Indications:  Atrial Fibrillation  Procedure Details Consent: Risks of procedure as well as the alternatives and risks of each were explained to the (patient/caregiver).  Consent for procedure obtained. Time Out: Verified patient identification, verified procedure, site/side was marked, verified correct patient position, special equipment/implants available, medications/allergies/relevent history reviewed, required imaging and test results available.  Performed  Patient placed on cardiac monitor, pulse oximetry, supplemental oxygen as necessary.  Sedation given: Propafol 100mg  Pacer pads placed anterior and posterior chest.  Cardioverted 1 time(s).  Cardioverted at 150J.  Evaluation Findings: Post procedure EKG shows: NSR Complications: None Patient did tolerate procedure well.   [name] [name] R 11/30/2011, 6:37 PM

## 2011-11-30 NOTE — Progress Notes (Signed)
UR Completed. Simmons, Mry Lamia F [phone]  

## 2011-11-30 NOTE — Progress Notes (Signed)
Dr. Katrinka Blazing has asked me to see patient for cardioversion.  Patient developed sudden onset of SOB in the evening of 3/24 and was found to be in atrial flutter in the office yesterday.  He was admitted and started on IV Heparin gtt.  He denied any palpitations at the time and cannot feel any palpitations at present in aflutter at 116bpm.  He is currently on an IV Amio gtt for rate control but rate remains 116bpm.  We will perform a TEE  to rule out clot prior to cardioversion.  Risks of the procedure have been discussed including risk of anesthesia, esophageal perforation and aspiration. The patient understands and agrees to proceed.

## 2011-11-30 NOTE — Interval H&P Note (Signed)
History and Physical Interval Note:  11/30/2011 3:21 PM  [name] [name]  has presented today for surgery, with the diagnosis of atrial flutter  The various methods of treatment have been discussed with the patient and family. After consideration of risks, benefits and other options for treatment, the patient has consented to  Procedure(s) (LRB): TRANSESOPHAGEAL ECHOCARDIOGRAM (TEE) (N/A) as a surgical intervention .  The patients' history has been reviewed, patient examined, no change in status, stable for surgery.  I have reviewed the patients' chart and labs.  Questions were answered to the patient's satisfaction.     TURNER,TRACI R

## 2011-11-30 NOTE — Interval H&P Note (Signed)
History and Physical Interval Note:  11/30/2011 1:50 PM  [name] [name]  has presented today for surgery, with the diagnosis of atrial flutter  The various methods of treatment have been discussed with the patient and family. After consideration of risks, benefits and other options for treatment, the patient has consented to  Procedure(s) (LRB): TRANSESOPHAGEAL ECHOCARDIOGRAM (TEE) (N/A) as a surgical intervention .  The patients' history has been reviewed, patient examined, no change in status, stable for surgery.  I have reviewed the patients' chart and labs.  Questions were answered to the patient's satisfaction.     Megen Madewell R

## 2011-11-30 NOTE — CV Procedure (Signed)
The patient underwent TEE without complications.  Results:  Normal LV size and function Normal RV size and function Normal RA Normal LA and LA appendage without evidence of thrombus Prosthetic AV with mobile leaflets poorly visualized due to   shadowing. There is significant perivalvular leaks on both  Sides of valve prosthesis at least moderate to severe in   Severity. Normal MV leaflets with mild MR Normal TV with mild TR Normal PV Normal thoracic aorta  The patient tolerated the procedure well without complications

## 2011-12-01 ENCOUNTER — Encounter (HOSPITAL_COMMUNITY): Payer: Self-pay | Admitting: Cardiology

## 2011-12-01 DIAGNOSIS — Z953 Presence of xenogenic heart valve: Secondary | ICD-10-CM

## 2011-12-01 DIAGNOSIS — I25810 Atherosclerosis of coronary artery bypass graft(s) without angina pectoris: Secondary | ICD-10-CM

## 2011-12-01 DIAGNOSIS — I10 Essential (primary) hypertension: Secondary | ICD-10-CM

## 2011-12-01 DIAGNOSIS — T8209XA Other mechanical complication of heart valve prosthesis, initial encounter: Secondary | ICD-10-CM

## 2011-12-01 LAB — CBC
Hemoglobin: 14 g/dL (ref 13.0–17.0)
MCV: 94.7 fL (ref 78.0–100.0)
Platelets: 204 10*3/uL (ref 150–400)
RBC: 4.35 MIL/uL (ref 4.22–5.81)
WBC: 7.8 10*3/uL (ref 4.0–10.5)

## 2011-12-01 MED ORDER — METOPROLOL TARTRATE 50 MG PO TABS: 50.0000 mg | ORAL_TABLET | Freq: Two times a day (BID) | ORAL | Status: AC

## 2011-12-01 MED ORDER — AMIODARONE HCL 200 MG PO TABS
400.0000 mg | ORAL_TABLET | Freq: Once | ORAL | Status: AC
  Filled 2011-12-01: qty 2

## 2011-12-01 MED ORDER — WARFARIN - PHARMACIST DOSING INPATIENT: Freq: Every day | Status: DC

## 2011-12-01 MED ORDER — RIVAROXABAN 10 MG PO TABS
20.0000 mg | ORAL_TABLET | Freq: Every day | ORAL | Status: DC
  Filled 2011-12-01: qty 2

## 2011-12-01 MED ORDER — ASPIRIN 81 MG PO TBEC: 81.0000 mg | DELAYED_RELEASE_TABLET | Freq: Every day | ORAL | Status: DC

## 2011-12-01 MED ORDER — AMIODARONE HCL 200 MG PO TABS: 200.0000 mg | ORAL_TABLET | Freq: Two times a day (BID) | ORAL | Status: DC

## 2011-12-01 MED ORDER — WARFARIN SODIUM 2.5 MG PO TABS: 2.5000 mg | ORAL_TABLET | Freq: Every day | ORAL | Status: DC

## 2011-12-01 MED ORDER — WARFARIN SODIUM 2.5 MG PO TABS
2.5000 mg | ORAL_TABLET | Freq: Every day | ORAL | Status: DC
  Administered 2011-12-01: 2.5 mg via ORAL
  Filled 2011-12-01: qty 1

## 2011-12-01 MED ORDER — RIVAROXABAN 20 MG PO TABS: 20.0000 mg | ORAL_TABLET | Freq: Every day | ORAL | Status: DC

## 2011-12-01 MED ORDER — AMIODARONE HCL 200 MG PO TABS
400.0000 mg | ORAL_TABLET | Freq: Once | ORAL | Status: DC
  Administered 2011-12-01: 400 mg via ORAL
  Filled 2011-12-01: qty 2

## 2011-12-01 MED ORDER — WARFARIN - PHYSICIAN DOSING INPATIENT: Freq: Every day | Status: DC

## 2011-12-01 NOTE — Progress Notes (Signed)
Pharmacy/ coumadin and xarelto  71 yo male with afib for DCCV today. Patient noted on Xarelto (20mg /day) and coumadin per MD (2.5mg /day).  Spoke with Dr. Katrinka Blazing and plans are for Xarelto with coumadin until 12/04/10 and then Xarelto only.  PT/INR planned for 12/07/11 to minimize influence of Xarelto on INR.   Plan -Will educate patient on coumadin today.  Harland German, Pharm D 12/01/2011 11:14 AM

## 2011-12-01 NOTE — Discharge Instructions (Signed)
Observe stools and urine. Please call if any blood is noted.  Xarelto is being used to "thin"(anticaogulate) the blood while we wait for coumadin to take effect. You only need to take 3 more days of the medication then stop, on 12/04/11(last day of Xarelto). Come by the office to pick up a sample box.

## 2011-12-01 NOTE — Progress Notes (Signed)
DC instructions are completed. Pt was discharged home with wife

## 2011-12-01 NOTE — Discharge Summary (Signed)
Patient ID: [name] [name] MRN: [id number] DOB/AGE: February 08, 1941 71 y.o.  Admit date: 11/29/2011 Discharge date: 12/01/2011  Primary Discharge Diagnosis:  Atrial flutter, converted to normal sinus rhythm with electrical cardioversion on 11/30/11  Secondary Discharge Diagnosis: 1. Aortic valve replacement, bioprosthetic, with no perivalvular leak.  2. Hypertension  3. Obesity  4. 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 Elevated troponin on admission secondary to demand ischemia, non-ST elevation MI type II  6. Hyperlipidemia  Significant Diagnostic Studies: 1. Transthoracic echocardiogram. 2. Transesophageal echocardiogram  Consults: Electrophysiology, Dr. Gertie Baron Course: The patient was admitted to [facility] after suddenly developing dyspnea all the prior evening. There've been no changes in his clinical status prior to the sudden onset of dyspnea. He was seen in the office and found to be in atrial flutter with 2:1 AV block.  After admission he was placed on IV amiodarone. Electrical cardioversion was performed the following day after transesophageal echo cardiography demonstrated no evidence of intra-atrial thrombus. He was seen in consultation by Dr. Hillis Range (electrophysiology). They discussed management strategies including the possibility of ablation. The patient will like to avoid ablation if possible. Based upon Dr. Johney Frame recommendations, I decided to anticoagulate the patient long term (Coumadin with a 4 day Xarelto bridge). Coumadin loading dose will be less intense given amiodarone therapy. We'll give him 2.5 mg of Coumadin daily until he comes to the Coumadin clinic on Monday, April 1. The last dose of Xarelto will be March 30.amiodarone therapy will be decreased to 200 mg per day after 2 weeks of 400 mg per day. Aspirin has been discontinued until he is on a stable Coumadin dose. We will then resume 81 mg of  aspirin daily because of his underlying coronary disease.  Ischemic evaluation was not felt to be indicated. The bioprosthetic perivalvular leak appears stable.  The patient will call if any bleeding or other complications occur.   Discharge Exam: Blood pressure 153/54, pulse 65, temperature 98.6 F (37 C), temperature source Oral, resp. rate 18, height 5\' 11"  (1.803 m), weight 106.142 kg (234 lb), SpO2 97.00%.    Lungs are clear the auscultation and percussion.  Cardiac exam reveals a grade 3/6 decrescendo murmur of aortic regurgitation.  Neurological exam is unremarkable.  Labs:   Lab Results  Component Value Date   WBC 7.8 12/01/2011   HGB 14.0 12/01/2011   HCT 41.2 12/01/2011   MCV 94.7 12/01/2011   PLT 204 12/01/2011     Lab 11/29/11 1255  NA 135  K 4.4  CL 96  CO2 26  BUN 13  CREATININE 1.11  CALCIUM 9.8  PROT 8.2  BILITOT 0.6  ALKPHOS 76  ALT 32  AST 37  GLUCOSE 121*   Lab Results  Component Value Date   CKTOTAL 123 11/30/2011   CKMB 3.4 11/30/2011   TROPONINI 0.45* 11/30/2011    Radiology: RADIOLOGY REPORT*  Clinical Data: Evaluate for CHF  CHEST - 2 VIEW  Comparison: 05/06/2009  Findings: Heart size is normal.  No pleural effusion or edema.  Calcified granulomas identified within both lungs.  No airspace consolidation identified.  IMPRESSION:  1. No active cardiopulmonary abnormalities.  2. No evidence for heart failure.  Original Report Authenticated By: Rosealee Albee, M.D.  ECHOCARDIOGRAM: Study Conclusions  - Left ventricle: There was moderate concentric hypertrophy. Systolic function was mildly to moderately reduced. The estimated ejection fraction was in the range of 40% to 45%. - Ventricular  septum: Septal motion showed abnormal function and dyssynergy. These changes are consistent with a post-thoracotomy state. - Aortic valve: A bioprosthesis was present. Mild perivalvular regurgitation. Valve area: 1.1cm^2(VTI). Valve area:  1.27cm^2 (Vmax). - Mitral valve: Mild regurgitation. - Left atrium: The atrium was mildly dilated.  EKG: Prominent voltage, mild first degree AV block, normal sinus rhythm.  FOLLOW UP PLANS AND APPOINTMENTS  Medication List  As of 12/01/2011  1:04 PM   STOP taking these medications         aspirin 325 MG tablet      OVER THE COUNTER MEDICATION         TAKE these medications         amiodarone 200 MG tablet   Commonly known as: PACERONE   Take 1 tablet (200 mg total) by mouth 2 (two) times daily.      furosemide 20 MG tablet   Commonly known as: LASIX   Take 20 mg by mouth daily.      metoprolol 50 MG tablet   Commonly known as: LOPRESSOR   Take 1 tablet (50 mg total) by mouth 2 (two) times daily.      omega-3 acid ethyl esters 1 G capsule   Commonly known as: LOVAZA   Take 1 g by mouth 3 (three) times daily.      potassium chloride SA 20 MEQ tablet   Commonly known as: K-DUR,KLOR-CON   Take 40 mEq by mouth daily.      Rivaroxaban 20 MG Tabs   Take 20 mg by mouth daily with supper.      rosuvastatin 20 MG tablet   Commonly known as: CRESTOR   Take 20 mg by mouth at bedtime.      warfarin 2.5 MG tablet   Commonly known as: COUMADIN   Take 1 tablet (2.5 mg total) by mouth daily.           Follow-up Information    Follow up with Lesleigh Noe, MD on 12/06/2011. (Coumadin 2 PM)    Contact information:   [address] [place] [address] [place] [address] [phone]       Follow up with Lesleigh Noe, MD on 12/08/2011. (2:15 pm)    Contact information:   [address] [place] [address] [phone]          BRING ALL MEDICATIONS WITH YOU TO FOLLOW UP APPOINTMENTS  Time spent with patient to include physician time: 30 min Signed: Lesleigh Noe 12/01/2011, 1:04 PM

## 2013-05-09 ENCOUNTER — Inpatient Hospital Stay (HOSPITAL_COMMUNITY)
Admission: AD | Admit: 2013-05-09 | Discharge: 2013-05-18 | DRG: 250 | Disposition: A | Discharge status: Home / Self Care | Payer: Medicare Other | Source: Ambulatory Visit | Attending: Interventional Cardiology | Admitting: Interventional Cardiology

## 2013-05-09 ENCOUNTER — Inpatient Hospital Stay (HOSPITAL_COMMUNITY): Payer: Medicare Other

## 2013-05-09 ENCOUNTER — Encounter (HOSPITAL_COMMUNITY): Payer: Self-pay | Admitting: Internal Medicine

## 2013-05-09 DIAGNOSIS — Z953 Presence of xenogenic heart valve: Secondary | ICD-10-CM

## 2013-05-09 DIAGNOSIS — I359 Nonrheumatic aortic valve disorder, unspecified: Secondary | ICD-10-CM | POA: Diagnosis present

## 2013-05-09 DIAGNOSIS — Z79899 Other long term (current) drug therapy: Secondary | ICD-10-CM

## 2013-05-09 DIAGNOSIS — D509 Iron deficiency anemia, unspecified: Secondary | ICD-10-CM | POA: Diagnosis present

## 2013-05-09 DIAGNOSIS — Z7901 Long term (current) use of anticoagulants: Secondary | ICD-10-CM

## 2013-05-09 DIAGNOSIS — I472 Ventricular tachycardia, unspecified: Secondary | ICD-10-CM | POA: Diagnosis not present

## 2013-05-09 DIAGNOSIS — E872 Acidosis, unspecified: Secondary | ICD-10-CM | POA: Diagnosis not present

## 2013-05-09 DIAGNOSIS — I4892 Unspecified atrial flutter: Principal | ICD-10-CM | POA: Diagnosis present

## 2013-05-09 DIAGNOSIS — Y831 Surgical operation with implant of artificial internal device as the cause of abnormal reaction of the patient, or of later complication, without mention of misadventure at the time of the procedure: Secondary | ICD-10-CM | POA: Diagnosis present

## 2013-05-09 DIAGNOSIS — J811 Chronic pulmonary edema: Secondary | ICD-10-CM

## 2013-05-09 DIAGNOSIS — G9349 Other encephalopathy: Secondary | ICD-10-CM | POA: Diagnosis not present

## 2013-05-09 DIAGNOSIS — J9600 Acute respiratory failure, unspecified whether with hypoxia or hypercapnia: Secondary | ICD-10-CM | POA: Diagnosis not present

## 2013-05-09 DIAGNOSIS — D599 Acquired hemolytic anemia, unspecified: Secondary | ICD-10-CM | POA: Diagnosis present

## 2013-05-09 DIAGNOSIS — N179 Acute kidney failure, unspecified: Secondary | ICD-10-CM | POA: Diagnosis not present

## 2013-05-09 DIAGNOSIS — J9601 Acute respiratory failure with hypoxia: Secondary | ICD-10-CM

## 2013-05-09 DIAGNOSIS — T8203XA Leakage of heart valve prosthesis, initial encounter: Secondary | ICD-10-CM

## 2013-05-09 DIAGNOSIS — I509 Heart failure, unspecified: Secondary | ICD-10-CM | POA: Diagnosis present

## 2013-05-09 DIAGNOSIS — E871 Hypo-osmolality and hyponatremia: Secondary | ICD-10-CM | POA: Diagnosis present

## 2013-05-09 DIAGNOSIS — I129 Hypertensive chronic kidney disease with stage 1 through stage 4 chronic kidney disease, or unspecified chronic kidney disease: Secondary | ICD-10-CM | POA: Diagnosis present

## 2013-05-09 DIAGNOSIS — I5043 Acute on chronic combined systolic (congestive) and diastolic (congestive) heart failure: Secondary | ICD-10-CM | POA: Diagnosis present

## 2013-05-09 DIAGNOSIS — I469 Cardiac arrest, cause unspecified: Secondary | ICD-10-CM | POA: Diagnosis not present

## 2013-05-09 DIAGNOSIS — I440 Atrioventricular block, first degree: Secondary | ICD-10-CM | POA: Diagnosis present

## 2013-05-09 DIAGNOSIS — G4733 Obstructive sleep apnea (adult) (pediatric): Secondary | ICD-10-CM | POA: Diagnosis present

## 2013-05-09 DIAGNOSIS — F17200 Nicotine dependence, unspecified, uncomplicated: Secondary | ICD-10-CM | POA: Diagnosis present

## 2013-05-09 DIAGNOSIS — I2589 Other forms of chronic ischemic heart disease: Secondary | ICD-10-CM | POA: Diagnosis present

## 2013-05-09 DIAGNOSIS — I4901 Ventricular fibrillation: Secondary | ICD-10-CM | POA: Diagnosis not present

## 2013-05-09 DIAGNOSIS — I5032 Chronic diastolic (congestive) heart failure: Secondary | ICD-10-CM | POA: Diagnosis present

## 2013-05-09 DIAGNOSIS — I2510 Atherosclerotic heart disease of native coronary artery without angina pectoris: Secondary | ICD-10-CM | POA: Diagnosis present

## 2013-05-09 DIAGNOSIS — I4891 Unspecified atrial fibrillation: Secondary | ICD-10-CM | POA: Diagnosis present

## 2013-05-09 DIAGNOSIS — R7309 Other abnormal glucose: Secondary | ICD-10-CM | POA: Diagnosis not present

## 2013-05-09 DIAGNOSIS — N183 Chronic kidney disease, stage 3 unspecified: Secondary | ICD-10-CM | POA: Diagnosis present

## 2013-05-09 DIAGNOSIS — I4729 Other ventricular tachycardia: Secondary | ICD-10-CM | POA: Diagnosis not present

## 2013-05-09 DIAGNOSIS — T8209XA Other mechanical complication of heart valve prosthesis, initial encounter: Secondary | ICD-10-CM | POA: Diagnosis present

## 2013-05-09 DIAGNOSIS — R9431 Abnormal electrocardiogram [ECG] [EKG]: Secondary | ICD-10-CM | POA: Diagnosis not present

## 2013-05-09 DIAGNOSIS — Z951 Presence of aortocoronary bypass graft: Secondary | ICD-10-CM

## 2013-05-09 DIAGNOSIS — T462X5A Adverse effect of other antidysrhythmic drugs, initial encounter: Secondary | ICD-10-CM | POA: Diagnosis not present

## 2013-05-09 DIAGNOSIS — Z952 Presence of prosthetic heart valve: Secondary | ICD-10-CM

## 2013-05-09 DIAGNOSIS — E876 Hypokalemia: Secondary | ICD-10-CM | POA: Diagnosis not present

## 2013-05-09 DIAGNOSIS — I10 Essential (primary) hypertension: Secondary | ICD-10-CM

## 2013-05-09 DIAGNOSIS — I5031 Acute diastolic (congestive) heart failure: Secondary | ICD-10-CM

## 2013-05-09 DIAGNOSIS — Z23 Encounter for immunization: Secondary | ICD-10-CM

## 2013-05-09 DIAGNOSIS — I4430 Unspecified atrioventricular block: Secondary | ICD-10-CM | POA: Diagnosis present

## 2013-05-09 HISTORY — DX: Unspecified atrial flutter: I48.92

## 2013-05-09 HISTORY — DX: Obstructive sleep apnea (adult) (pediatric): G47.33

## 2013-05-09 LAB — TROPONIN I
Troponin I: 0.39 ng/mL (ref <0.30)
Troponin I: 0.38 ng/mL (ref <0.30)

## 2013-05-09 LAB — MAGNESIUM: Magnesium: 2.4 mg/dL (ref 1.5–2.5)

## 2013-05-09 LAB — CBC WITH DIFFERENTIAL/PLATELET
Basophils Absolute: 0 10*3/uL (ref 0.0–0.1)
Eosinophils Absolute: 0.1 10*3/uL (ref 0.0–0.7)
Eosinophils Relative: 1 % (ref 0–5)
Lymphs Abs: 1.4 10*3/uL (ref 0.7–4.0)
MCH: 23.8 pg — ABNORMAL LOW (ref 26.0–34.0)
MCV: 77 fL — ABNORMAL LOW (ref 78.0–100.0)
Platelets: 395 10*3/uL (ref 150–400)
RDW: 16.9 % — ABNORMAL HIGH (ref 11.5–15.5)

## 2013-05-09 LAB — COMPREHENSIVE METABOLIC PANEL
ALT: 32 U/L (ref 0–53)
Calcium: 9.1 mg/dL (ref 8.4–10.5)
GFR calc Af Amer: 48 mL/min — ABNORMAL LOW (ref >90)
Glucose, Bld: 188 mg/dL — ABNORMAL HIGH (ref 70–99)
Sodium: 134 mEq/L — ABNORMAL LOW (ref 135–145)
Total Protein: 7.3 g/dL (ref 6.0–8.3)

## 2013-05-09 LAB — PROTIME-INR
INR: 2.08 — ABNORMAL HIGH (ref 0.00–1.49)
Prothrombin Time: 22.7 seconds — ABNORMAL HIGH (ref 11.6–15.2)

## 2013-05-09 MED ORDER — FUROSEMIDE 10 MG/ML IJ SOLN
40.0000 mg | Freq: Two times a day (BID) | INTRAMUSCULAR | Status: DC
  Administered 2013-05-09 – 2013-05-10 (×3): 40 mg via INTRAVENOUS
  Filled 2013-05-09 (×3): qty 4

## 2013-05-09 MED ORDER — AMIODARONE HCL 200 MG PO TABS
200.0000 mg | ORAL_TABLET | Freq: Two times a day (BID) | ORAL | Status: DC
  Administered 2013-05-09 – 2013-05-12 (×6): 200 mg via ORAL
  Filled 2013-05-09 (×8): qty 1

## 2013-05-09 MED ORDER — WARFARIN - PHARMACIST DOSING INPATIENT
Freq: Every day | Status: DC
  Administered 2013-05-15 – 2013-05-17 (×2)

## 2013-05-09 MED ORDER — WARFARIN SODIUM 2.5 MG PO TABS
2.5000 mg | ORAL_TABLET | Freq: Once | ORAL | Status: DC
Start: 2013-05-09 — End: 2013-05-10
  Filled 2013-05-09: qty 1

## 2013-05-09 MED ORDER — SODIUM CHLORIDE 0.9 % IJ SOLN
3.0000 mL | Freq: Two times a day (BID) | INTRAMUSCULAR | Status: DC
  Administered 2013-05-09 – 2013-05-14 (×9): 3 mL via INTRAVENOUS
  Administered 2013-05-15: 10:00:00 via INTRAVENOUS

## 2013-05-09 MED ORDER — ONDANSETRON HCL 4 MG/2ML IJ SOLN: 4.0000 mg | Freq: Four times a day (QID) | INTRAMUSCULAR | Status: DC | PRN

## 2013-05-09 MED ORDER — POTASSIUM CHLORIDE CRYS ER 20 MEQ PO TBCR
40.0000 meq | EXTENDED_RELEASE_TABLET | Freq: Every day | ORAL | Status: DC
  Administered 2013-05-10: 40 meq via ORAL
  Filled 2013-05-09 (×2): qty 2

## 2013-05-09 MED ORDER — ACETAMINOPHEN 325 MG PO TABS: 650.0000 mg | ORAL_TABLET | ORAL | Status: DC | PRN

## 2013-05-09 MED ORDER — SODIUM CHLORIDE 0.9 % IV SOLN
250.0000 mL | INTRAVENOUS | Status: DC | PRN
  Administered 2013-05-11: 250 mL via INTRAVENOUS

## 2013-05-09 MED ORDER — OMEGA-3-ACID ETHYL ESTERS 1 G PO CAPS
1.0000 g | ORAL_CAPSULE | Freq: Three times a day (TID) | ORAL | Status: DC
  Administered 2013-05-09 – 2013-05-17 (×23): 1 g via ORAL
  Filled 2013-05-09 (×30): qty 1

## 2013-05-09 MED ORDER — AMLODIPINE BESYLATE 5 MG PO TABS
5.0000 mg | ORAL_TABLET | Freq: Every day | ORAL | Status: DC
  Administered 2013-05-10 – 2013-05-17 (×8): 5 mg via ORAL
  Filled 2013-05-09 (×9): qty 1

## 2013-05-09 MED ORDER — ATORVASTATIN CALCIUM 10 MG PO TABS
10.0000 mg | ORAL_TABLET | Freq: Every day | ORAL | Status: DC
  Administered 2013-05-09 – 2013-05-17 (×9): 10 mg via ORAL
  Filled 2013-05-09 (×10): qty 1

## 2013-05-09 MED ORDER — SODIUM CHLORIDE 0.9 % IJ SOLN: 3.0000 mL | INTRAMUSCULAR | Status: DC | PRN

## 2013-05-09 NOTE — Consult Note (Signed)
Primary Care Physician: Lesleigh Noe, MD Referring Physician:  Dr Abundio Miu is a 72 y.o. male with a h/o CAD s/p CABG and aortic valve replacement who presents with symptomatic sustained atrial flutter. He presented 3/13 with atrial flutter and was placed on amiodarone.  He was cardioverted and anticoagulated with coumadin.  He has done well since. He reports decreased exercise tolerance over the past 4-5 days.  He reports associated SOB.  He presented to Dr Lonn Georgia office and was found to have atrial flutter with elevated V rates.  EP is consulted for further management.  Today, he denies symptoms of palpitations, chest pain, orthopnea, PND, lower extremity edema, dizziness, presyncope, syncope, or neurologic sequela. The patient is tolerating medications without difficulties and is otherwise without complaint today.   Past Medical History  Diagnosis Date  . Coronary artery disease   . Angina   . Hypertension   . Atrial flutter    Past Surgical History  Procedure Laterality Date  . Coronary artery bypass graft    . Cardiac valve replacement      aortic valve replacement (bioprosthetic)  . Tee without cardioversion  11/30/2011    Procedure: TRANSESOPHAGEAL ECHOCARDIOGRAM (TEE);  Surgeon: Quintella Reichert, MD;  Location: [facility] ENDOSCOPY;  Service: Cardiovascular;  Laterality: N/A;    Current Facility-Administered Medications  Medication Dose Route Frequency Provider Last Rate Last Dose  . 0.9 %  sodium chloride infusion  250 mL Intravenous PRN Lyn Records III, MD      . acetaminophen (TYLENOL) tablet 650 mg  650 mg Oral Q4H PRN Lyn Records III, MD      . amiodarone (PACERONE) tablet 200 mg  200 mg Oral BID Lyn Records III, MD   200 mg at 05/09/13 1430  . [START ON 05/10/2013] amLODipine (NORVASC) tablet 5 mg  5 mg Oral Daily Lyn Records III, MD      . atorvastatin (LIPITOR) tablet 10 mg  10 mg Oral q1800 Lyn Records III, MD   10 mg at 05/09/13 1800  . furosemide  (LASIX) injection 40 mg  40 mg Intravenous BID Lyn Records III, MD   40 mg at 05/09/13 1800  . omega-3 acid ethyl esters (LOVAZA) capsule 1 g  1 g Oral TID Lyn Records III, MD   1 g at 05/09/13 1600  . ondansetron (ZOFRAN) injection 4 mg  4 mg Intravenous Q6H PRN Lesleigh Noe, MD      . Melene Muller ON 05/10/2013] potassium chloride SA (K-DUR,KLOR-CON) CR tablet 40 mEq  40 mEq Oral Daily Lyn Records III, MD      . sodium chloride 0.9 % injection 3 mL  3 mL Intravenous Q12H Lyn Records III, MD   3 mL at 05/09/13 1315  . sodium chloride 0.9 % injection 3 mL  3 mL Intravenous PRN Lyn Records III, MD      . warfarin (COUMADIN) tablet 2.5 mg  2.5 mg Oral ONCE-1800 Severiano Gilbert, [facility]      . Warfarin - Pharmacist Dosing Inpatient   Does not apply q1800 Severiano Gilbert, [facility]        No Known Allergies  History   Social History  . Marital Status: Married    Spouse Name: N/A    Number of Children: N/A  . Years of Education: N/A   Occupational History  . Not on file.   Social History Main Topics  .  Smoking status: Current Every Day Smoker -- 0.25 packs/day for 10 years    Types: Cigarettes  . Smokeless tobacco: Former Neurosurgeon    Quit date: 11/29/1978  . Alcohol Use: 1.2 oz/week    2 Glasses of wine per week  . Drug Use: No  . Sexual Activity: Yes   Other Topics Concern  . Not on file   Social History Narrative  . No narrative on file   ROS- All systems are reviewed and negative except as per the HPI above  Physical Exam: Filed Vitals:   05/09/13 1100 05/09/13 1808 05/09/13 2000  BP: 130/58 128/55 124/58  Pulse: 97 99 96  Temp: 98.5 F (36.9 C) 98.6 F (37 C) 98.8 F (37.1 C)  TempSrc: Oral Oral Oral  Resp: 22 26 23   Height: 5\' 11"  (1.803 m)    Weight: 211 lb 4.8 oz (95.845 kg)    SpO2: 96% 97% 93%    GEN- The patient is well appearing, alert and oriented x 3 today.   Head- normocephalic, atraumatic Eyes-  Sclera clear, conjunctiva pink Ears- hearing  intact Oropharynx- clear Neck- supple  Lymph- no cervical lymphadenopathy Lungs- Clear to ausculation bilaterally, normal work of breathing Heart- irregular rate and rhythm  GI- soft, NT, ND, + BS Extremities- no clubbing, cyanosis, or edema MS- no significant deformity or atrophy Skin- no rash or lesion Psych- euthymic mood, full affect Neuro- strength and sensation are intact  EKG today reveals typical appearing atrial flutter with 2:1 AV conduction Echo 3/14 reviewed  Assessment and Plan:  1. Atrial flutter The patient presents with typical appearing atrial flutter.  V rates are stable.  The patient is appropriately anticoagulated with coumadin.  I will ask Dr Katrinka Blazing to review INRs from his office to make sure that the patient has been recently therapeutic. Therapeutic strategies for atrial flutter including medicine and ablation were discussed in detail with the patient today. Risk, benefits, and alternatives to EP study and radiofrequency ablation were also discussed in detail today. These risks include but are not limited to stroke, bleeding, vascular damage, tamponade, perforation, damage to the heart and other structures, AV block requiring pacemaker, worsening renal function, and death. At this time the patient wishes to further contemplate his options.  He would like to discuss this further with his spouse. IF he decides to proceed with ablation then I would tentatively plan to proceed with Friday as long as other medical issues are stable.  Continue coumadin and rate control in the interim.  2. Acute diastolic dysfunction Rate control and treatment of atrial flutter as above Other treatment per Dr Katrinka Blazing

## 2013-05-09 NOTE — Progress Notes (Signed)
ANTICOAGULATION CONSULT NOTE - Initial Consult  Pharmacy Consult for warfarin  Indication: atrial fibrillation  No Known Allergies  Patient Measurements: Height: 5\' 11"  (180.3 cm) Weight: 211 lb 4.8 oz (95.845 kg) IBW/kg (Calculated) : 75.3   Vital Signs: Temp: 98.5 F (36.9 C) (09/03 1100) Temp src: Oral (09/03 1100) BP: 130/58 mmHg (09/03 1100) Pulse Rate: 97 (09/03 1100)  Labs:  Recent Labs  05/09/13 1430  HGB 8.5*  HCT 27.5*  PLT 395  LABPROT 22.7*  INR 2.08*    Estimated Creatinine Clearance: 73.1 ml/min (by C-G formula based on Cr of 1.11).   Medical History: Past Medical History  Diagnosis Date  . Coronary artery disease   . Angina   . Hypertension   . Dysrhythmia   . Heart murmur   . Shortness of breath    Assessment: 71 year old male with hx of afib on chronic coumadin. INR therapeutic at 2. No bleeding issues noted. Will continue home dose.  Goal of Therapy:  INR 2-3 Monitor platelets by anticoagulation protocol: Yes   Plan:  Warfarin 2.5mg  tonight INR daily  Sheppard Coil PharmD., BCPS Clinical Pharmacist Pager [phone] 05/09/2013 3:29 PM

## 2013-05-09 NOTE — H&P (Addendum)
[name] [name] is a 72 y.o. male  Admit date: 05/09/2013 Referring Physician:  Kirby Funk, M.D. Primary Cardiologist:: Hurshel Keys, III M.D. Chief complaint / reason for admission: CHF and atrial flutter   HPI: The patient is 72 years of age. He has a history of coronary artery disease with coronary bypass surgery in 2010, aortic valve replacement 2000 to have aortic stenosis (bioprosthesis), and postoperative atrial arrhythmias. He has had electrical cardioversion for atrial flutter with rapid ventricular response. He is below amiodarone therapy suppression for greater than a year. During atrial flutter he developed diastolic heart failure. Several months after the aortic valve replacement he was noted to have a paravalvular leak. This continues as a problem but without significant progression with the regurgitation felt to be mild to moderate with preserved LVEF 65% in January 2013.  Starting this past weekend the patient's wife noted a dramatic decrease in his energy and increased shortness of breath. She also began to notice a there were pulsations in his neck as though his heart was racing. Because of this he came into the office for evaluation this morning and was found to be in atrial flutter with AV block and a resting heart rate of 98 beats per minute(typical heart rate 60-70 beats per minute).  Additional problems of the patient has had over the past several months is weight loss, anemia with a hemoglobin as low as 8.1 diagnosed in August by his primary physician Dr. Kirby Funk. It appears that the anemia is iron deficiency. Iron therapy was started by Dr. Valentina Lucks. No repeat studies have been done since mid-August.  Additionally, the patient just completed a sleep study that demonstrates severe sleep apnea. All of the study after the patient describes what sounds like Cheyne-Stokes respirations on the last office visit.the sleep apnea has not yet been treated.    PMH:    Past  Medical History  Diagnosis Date  . Coronary artery disease   . Angina   . Hypertension   . Dysrhythmia   . Heart murmur   . Shortness of breath     PSH:    Past Surgical History  Procedure Laterality Date  . Coronary artery bypass graft    . Cardiac valve replacement    . Tee without cardioversion  11/30/2011    Procedure: TRANSESOPHAGEAL ECHOCARDIOGRAM (TEE);  Surgeon: Quintella Reichert, MD;  Location: [facility] ENDOSCOPY;  Service: Cardiovascular;  Laterality: N/A;    ALLERGIES:   Review of patient's allergies indicates no known allergies.  Prior to Admit Meds:   Prescriptions prior to admission  Medication Sig Dispense Refill  . omega-3 acid ethyl esters (LOVAZA) 1 G capsule Take 1 g by mouth 3 (three) times daily.      . potassium chloride SA (K-DUR,KLOR-CON) 20 MEQ tablet Take 40 mEq by mouth daily.      . rivaroxaban 20 MG TABS Take 20 mg by mouth daily with supper.  4 tablet  0  . rosuvastatin (CRESTOR) 20 MG tablet Take 20 mg by mouth at bedtime.      Marland Kitchen warfarin (COUMADIN) 2.5 MG tablet Take 1 tablet (2.5 mg total) by mouth daily.  30 tablet  11   Family HX:   No family history on file. Social HX:    History   Social History  . Marital Status: Married    Spouse Name: N/A    Number of Children: N/A  . Years of Education: N/A   Occupational History  .  Not on file.   Social History Main Topics  . Smoking status: Current Every Day Smoker -- 0.25 packs/day for 10 years    Types: Cigarettes  . Smokeless tobacco: Former Neurosurgeon    Quit date: 11/29/1978  . Alcohol Use: 1.2 oz/week    2 Glasses of wine per week  . Drug Use: No  . Sexual Activity: Yes   Other Topics Concern  . Not on file   Social History Narrative  . No narrative on file     ROS: his appetite has been good. He denies chills and fever. There's been no blood in his urine or stool. No recent falls or head trauma. He does have two-pillow orthopnea. There is no lower extremity edema. No neurological complaints.  No nausea or vomiting.  Physical Exam: Blood pressure 130/58, pulse 97, temperature 98.5 F (36.9 C), temperature source Oral, resp. rate 22, height 5\' 11"  (1.803 m), weight 95.845 kg (211 lb 4.8 oz), SpO2 96.00%.    The patient appears somewhat pale. His affect is flat.  The neck exam reveals elevated JVD with flutter waves noted. No carotid bruits are heard.  Chest reveals decreased breath sounds at both bases.  The cardiac exam reveals a regular rhythm with a 3 / 6 decrescendo murmur of aortic regurgitation. No gallop rhythm is heard.No rub is heard.  Abdominal exam reveals a liver spleen enlargement. Hepatojugular reflux is noted.  Left greater than right lower extremity edema 1-2+ is noted. The edema comes to midshin bilaterally.  On neurological exam the patient is alert and oriented x3. No obvious focal motor or sensory deficit is noted.   Labs:   Lab Results  Component Value Date   WBC 7.8 12/01/2011   HGB 14.0 12/01/2011   HCT 41.2 12/01/2011   MCV 94.7 12/01/2011   PLT 204 12/01/2011   No results found for this basename: NA, K, CL, CO2, BUN, CREATININE, CALCIUM, LABALBU, PROT, BILITOT, ALKPHOS, ALT, AST, GLUCOSE,  in the last 168 hours Lab Results  Component Value Date   CKTOTAL 123 11/30/2011   CKMB 3.4 11/30/2011   TROPONINI 0.45* 11/30/2011   No laboratory data available  EKG:  Office EKG demonstrated a atrial flutter at approximately 200 beats per minute, LVH, with no acute ST-T wave changes. He was performed this morning around 10 AM.  ASSESSMENT: 1. Recurrent atrial flutter with 2:1 AV conduction. Present for at least 72 hours perhaps longer.  2. Acute diastolic heart failure exacerbated by the development of atrial flutter  3. Severe anemia, not fully characterize with most recent hemoglobin of 8.1 by Dr. Valentina Lucks in August  4. Severe sleep apnea, untreated, and just diagnosed within the past week.  5. Coronary atherosclerotic heart disease, stable without  ischemia  6. Dysfunctional aortic valve bioprosthesis with paravalvular leak, perhaps worse.  7. Weight loss  Plan:  1. Admit to hospital for management of atrial flutter. Will get EP consultation with reference to ablation versus cardioversion and medical therapy  2. Treat heart failure  3. Blood cultures rule out endocarditis  4. Consider electrical cardioversion  5. Get primary care involved because I am concerned about the patient's weight loss Lesleigh Noe 05/09/2013 12:28 PM

## 2013-05-10 ENCOUNTER — Inpatient Hospital Stay (HOSPITAL_COMMUNITY): Payer: Medicare Other

## 2013-05-10 DIAGNOSIS — D599 Acquired hemolytic anemia, unspecified: Secondary | ICD-10-CM | POA: Diagnosis present

## 2013-05-10 DIAGNOSIS — I5031 Acute diastolic (congestive) heart failure: Secondary | ICD-10-CM

## 2013-05-10 DIAGNOSIS — I10 Essential (primary) hypertension: Secondary | ICD-10-CM

## 2013-05-10 DIAGNOSIS — I472 Ventricular tachycardia: Secondary | ICD-10-CM

## 2013-05-10 DIAGNOSIS — I2510 Atherosclerotic heart disease of native coronary artery without angina pectoris: Secondary | ICD-10-CM

## 2013-05-10 LAB — BASIC METABOLIC PANEL
CO2: 25 mEq/L (ref 19–32)
Calcium: 8.6 mg/dL (ref 8.4–10.5)
GFR calc Af Amer: 52 mL/min — ABNORMAL LOW (ref >90)
GFR calc non Af Amer: 45 mL/min — ABNORMAL LOW (ref >90)
Sodium: 134 mEq/L — ABNORMAL LOW (ref 135–145)

## 2013-05-10 LAB — PROTIME-INR
INR: 2.18 — ABNORMAL HIGH (ref 0.00–1.49)
Prothrombin Time: 23.6 seconds — ABNORMAL HIGH (ref 11.6–15.2)

## 2013-05-10 LAB — TROPONIN I: Troponin I: 0.37 ng/mL (ref <0.30)

## 2013-05-10 MED ORDER — WARFARIN SODIUM 5 MG PO TABS
5.0000 mg | ORAL_TABLET | Freq: Once | ORAL | Status: AC
  Administered 2013-05-10: 5 mg via ORAL
  Filled 2013-05-10: qty 1

## 2013-05-10 MED ORDER — FUROSEMIDE 10 MG/ML IJ SOLN
80.0000 mg | Freq: Four times a day (QID) | INTRAMUSCULAR | Status: AC
  Administered 2013-05-10 – 2013-05-11 (×4): 80 mg via INTRAVENOUS
  Filled 2013-05-10 (×3): qty 8

## 2013-05-10 MED ORDER — FENTANYL CITRATE 0.05 MG/ML IJ SOLN: 25.0000 ug | INTRAMUSCULAR | Status: DC | PRN

## 2013-05-10 MED ORDER — ALBUTEROL SULFATE HFA 108 (90 BASE) MCG/ACT IN AERS: 6.0000 | INHALATION_SPRAY | RESPIRATORY_TRACT | Status: DC | PRN

## 2013-05-10 MED ORDER — BIOTENE DRY MOUTH MT LIQD
15.0000 mL | Freq: Four times a day (QID) | OROMUCOSAL | Status: DC
  Administered 2013-05-11 – 2013-05-13 (×7): 15 mL via OROMUCOSAL

## 2013-05-10 MED ORDER — METOPROLOL TARTRATE 1 MG/ML IV SOLN
5.0000 mg | Freq: Four times a day (QID) | INTRAVENOUS | Status: DC
  Filled 2013-05-10: qty 5

## 2013-05-10 MED ORDER — PANTOPRAZOLE SODIUM 40 MG IV SOLR
40.0000 mg | Freq: Every day | INTRAVENOUS | Status: DC
  Administered 2013-05-11 – 2013-05-12 (×2): 40 mg via INTRAVENOUS
  Filled 2013-05-10 (×3): qty 40

## 2013-05-10 MED ORDER — CHLORHEXIDINE GLUCONATE 0.12 % MT SOLN
15.0000 mL | Freq: Two times a day (BID) | OROMUCOSAL | Status: DC
  Administered 2013-05-11 (×2): 15 mL via OROMUCOSAL
  Filled 2013-05-10 (×3): qty 15

## 2013-05-10 MED ORDER — FENTANYL CITRATE 0.05 MG/ML IJ SOLN
INTRAMUSCULAR | Status: AC
  Administered 2013-05-10 – 2013-05-11 (×2): 50 ug
  Filled 2013-05-10: qty 2

## 2013-05-10 NOTE — Code Documentation (Signed)
  Patient Name: [name] [name]   MRN: [id number]   Date of Birth/ Sex: 1940-12-20 , male      Admission Date: 05/09/2013  Attending Provider: Lupita Leash, MD  Primary Diagnosis: Atrial flutter with rapid ventricular response   Indication: Pt was in his usual state of health until this PM, when he was noted to be tachycardic to 180. Soon after, he became unresponsive and pulseless. Code blue was subsequently called. At the time of arrival on scene, ACLS protocol was underway. The patient appeared to be in a wide complex tachyarrhythmia (180 bpm) when he lost consciousness. After 4 minutes of ACLS the patient had ROSC. He remained unconscious and exhibited agonal respirations. The PCCM team performed RSI to secure the airway. The patient was then transferred to CCU for further management of his care.   Technical Description:  - CPR performance duration:  4  minutes  - Was defibrillation or cardioversion used? Yes  - Was external pacer placed? No  - Was patient intubated pre/post CPR? Yes   Medications Administered: Y = Yes; Blank = No Amiodarone    Atropine  Y  Calcium    Epinephrine  Y  Lidocaine    Magnesium    Norepinephrine    Phenylephrine    Sodium bicarbonate    Vasopressin     Post CPR evaluation:  - Final Status - Was patient successfully resuscitated ? Yes - What is current rhythm? Sinus Tachycardia - What is current hemodynamic status? BP 142/61  Miscellaneous Information:  - Labs sent, including: Lactica Acid, CMP, Trop, CBC, Trop, Mag  - Primary team notified?  Yes  - Family Notified? Yes  - Additional notes/ transfer status: Transfered to CCU     Pleas Koch, MD  05/10/2013, 11:48 PM

## 2013-05-10 NOTE — Progress Notes (Signed)
05/10/13 2300  Clinical Encounter Type  Visited With Health care provider Selena Batten, Administrative Coordinator)  Visit Type Code   Responded to code blue, but no family present.  Per NT Philinda, family just left.  Please page [phone] for family/pt support as needed.  Will refer to day chaplain for follow-up care.  [address] [place], [place] [phone]

## 2013-05-10 NOTE — Consult Note (Addendum)
PULMONARY  / CRITICAL CARE MEDICINE  Name: [name] [name] MRN: [id number] DOB: September 13, 1940    ADMISSION DATE:  05/09/2013 CONSULTATION DATE:  05/10/2013  REFERRING MD :  Halina Andreas Cards PRIMARY SERVICE: [facility]  CHIEF COMPLAINT:  Cardiac arrest  BRIEF PATIENT DESCRIPTION: 72 y/o male with CAD and Aflutter was admitted on 05/09/2013 for management of Aflutter and unfortunately had a VTach arrest on 05/10/2013 requiring CPR.  SIGNIFICANT EVENTS / STUDIES:  9/3 Admission 9/4 VTach Arrest, 5-6 minutes CPR  LINES / TUBES: 9/4 ETT >> 9/5 CVL >>  CULTURES:   ANTIBIOTICS:   HISTORY OF PRESENT ILLNESS:  72 y/o male with CAD and Aflutter was admitted on 05/09/2013 for management of Aflutter and unfortunately had a VTach arrest on 05/10/2013 requiring CPR. History was provided by chart review and discussion with his wife given the nature of his cardiac arrest.  He was complaining of several days of weakness when he went to Dr. Michaelle Copas office on 9/3 and was found to be in Aflutter.  Plans were made for an EP ablation procedure on 9/5.  On 9/4 in the evening he developed a rapid SVT.  By report, he was receiving metoprolol when he became unresponsive and his nurse noted pulseless VTach.  CPR was administered for 5-6 minutes.  He received epi x1 and biphasic cardioversion at 200J x1.  After this he had a bradycardic narrow complex rhythm which was treated with atropine x1.  At that point he had a normal blood pressure but was breathing agonally so he was intubated.  He was not following commands at that point or responding to pain.  He received paralytics for rapid sequence intubation at 2310.  He received fentanyl for sedation approximately 15 minutes post intubation.  PAST MEDICAL HISTORY :  Past Medical History  Diagnosis Date  . Coronary artery disease   . Angina   . Hypertension   . Atrial flutter   . Obstructive sleep apnea     severe   Past Surgical History  Procedure  Laterality Date  . Coronary artery bypass graft    . Cardiac valve replacement      aortic valve replacement (bioprosthetic)  . Tee without cardioversion  11/30/2011    Procedure: TRANSESOPHAGEAL ECHOCARDIOGRAM (TEE);  Surgeon: Quintella Reichert, MD;  Location: [facility] ENDOSCOPY;  Service: Cardiovascular;  Laterality: N/A;   Prior to Admission medications   Medication Sig Start Date End Date Taking? Authorizing Provider  amLODipine (NORVASC) 5 MG tablet Take 5 mg by mouth daily.   Yes Historical Provider, MD  amoxicillin (AMOXIL) 500 MG capsule Take 2,000 mg by mouth once as needed (take 1 hour prior to dental appointment).   Yes Historical Provider, MD  furosemide (LASIX) 80 MG tablet Take 40-80 mg by mouth See admin instructions. Takes 1 tablet (80 mg) in the morning and a half tablet (40 mg) at midday   Yes Historical Provider, MD  Multiple Vitamins-Minerals (MULTIVITAMIN PO) Take 2-3 tablets by mouth daily.   Yes Historical Provider, MD  omega-3 acid ethyl esters (LOVAZA) 1 G capsule Take 1 g by mouth 3 (three) times daily.   Yes Historical Provider, MD  OVER THE COUNTER MEDICATION Take 1 packet by mouth at bedtime. [employer] herbal powder sleep aid   Yes Historical Provider, MD  potassium chloride SA (K-DUR,KLOR-CON) 20 MEQ tablet Take 40 mEq by mouth daily.   Yes Historical Provider, MD  warfarin (COUMADIN) 5 MG tablet Take 2.5-5 mg by mouth See  admin instructions. Takes 1 tablet (5mg ) on Mondays and Fridays and a half tablet (2.5 mg) all other days   Yes Historical Provider, MD  amiodarone (PACERONE) 200 MG tablet Take 1 tablet (200 mg total) by mouth 2 (two) times daily. 12/01/11 11/30/12  Lesleigh Noe, MD   No Known Allergies  FAMILY HISTORY/SH/REVIEW OF SYSTEMS:  Cannot obtain due to intubation  SUBJECTIVE:   VITAL SIGNS: Temp:  [97.4 F (36.3 C)-98.9 F (37.2 C)] 98.6 F (37 C) (09/04 2004) Pulse Rate:  [96-112] 112 (09/04 2335) Resp:  [16-33] 16 (09/04 2335) BP: (96-142)/(48-65)  142/61 mmHg (09/04 2200) SpO2:  [91 %-100 %] 91 % (09/04 2004) FiO2 (%):  [100 %] 100 % (09/04 2335) Weight:  [96 kg (211 lb 10.3 oz)] 96 kg (211 lb 10.3 oz) (09/04 0500) HEMODYNAMICS:   VENTILATOR SETTINGS: Vent Mode:  [-]  FiO2 (%):  [100 %] 100 % INTAKE / OUTPUT: Intake/Output     09/04 0701 - 09/05 0700   P.O. 480   I.V. (mL/kg)    Total Intake(mL/kg) 480 (5)   Urine (mL/kg/hr) 975 (0.6)   Total Output 975   Net -495         PHYSICAL EXAMINATION:  Gen: sedated on vent HEENT: NCAT, Anisicoria R pupil but reactive to light; left pupil round and reactive to light, EOMi, ETT in place PULM: Rhonchi, rales bilaterally CV: RRR, loud systolic murmur all over, no JVD AB: BS+, soft, nontender, no hsm Ext: warm, trace pretibial edema, no clubbing, no cyanosis Derm: no rash or skin breakdown Neuro: Sedated on vent but follows commands   LABS:  CBC Recent Labs     05/09/13  1430  WBC  7.8  HGB  8.5*  HCT  27.5*  PLT  395   Coag's Recent Labs     05/09/13  1430  05/10/13  0200  INR  2.08*  2.18*   BMET Recent Labs     05/09/13  1430  05/10/13  0200  NA  134*  134*  K  4.0  3.7  CL  97  98  CO2  26  25  BUN  26*  25*  CREATININE  1.63*  1.51*  GLUCOSE  188*  112*   Electrolytes Recent Labs     05/09/13  1430  05/10/13  0200  CALCIUM  9.1  8.6  MG  2.4   --    Sepsis Markers No results found for this basename: LACTICACIDVEN, PROCALCITON, O2SATVEN,  in the last 72 hours ABG No results found for this basename: PHART, PCO2ART, PO2ART,  in the last 72 hours Liver Enzymes Recent Labs     05/09/13  1430  AST  34  ALT  32  ALKPHOS  69  BILITOT  0.7  ALBUMIN  3.2*   Cardiac Enzymes Recent Labs     05/09/13  1430  05/09/13  2000  05/10/13  0200  TROPONINI  0.39*  0.38*  0.37*  PROBNP  2487.0*   --    --    Glucose No results found for this basename: GLUCAP,  in the last 72 hours  Imaging   CXR: Bilateral pulmonary edema, pleural  effusions, ETT and CVL in place  ASSESSMENT / PLAN:  PULMONARY A: Acute hypoxemic respiratory failure post arrest due to CHF exacerbation P:   -see cards -Increase PEEP to 14 -Wean FiO2 to 60% -Increase RR for acidosis -repeat ABG 0200 on 9/5 -WUA/SBT after Ablation?  CARDIOVASCULAR A:  VTach arrest after flutter with 1:1 conduction; 5-6 minutes CPR Aflutter  Acute exacerbation CHF due to arrhythmia> diastolic dysfunction Bioprosthetic aortic valve with insufficiency CAD Hypertension P:  -diurese overnight -monitor K, Mg -continue amiodarone (reload per cardiology) -tele -add asa -continue warfarin per pharm -EP ablation on 9/5? EP Study for VTach?  RENAL A:  CKD with increased Cr P:   -monitor UOP and BMET -diurese -foley  GASTROINTESTINAL A:  No acute issues P:   -OG tube -Pantoprazole for stress ulcer prophylaxis  HEMATOLOGIC A:  Warfarin for Aflutter Anemia of uncertain etiology, no active bleeding P: -continue warfarin -needs outpatient anemia work up  INFECTIOUS A:  No acute issues P: -monitor fever curve  ENDOCRINE A:  Hyperglycemia post code P:   -monitor glucose  NEUROLOGIC A:  Acute encephalopathy post arrest, now mental status improving and he is following commands; no indication for cooling at this point P:   -fentanyl gtt, prn versed titrated to RASS -2  Code: Full  TODAY'S SUMMARY:   I have personally obtained a history, examined the patient, evaluated laboratory and imaging results, formulated the assessment and plan and placed orders. CRITICAL CARE: The patient is critically ill with multiple organ systems failure and requires high complexity decision making for assessment and support, frequent evaluation and titration of therapies, application of advanced monitoring technologies and extensive interpretation of multiple databases. Critical Care Time devoted to patient care services described in this note is 90 minutes.    Fonnie Jarvis Pulmonary and Critical Care Medicine [facility] Pager: [phone]  05/10/2013, 11:36 PM

## 2013-05-10 NOTE — Progress Notes (Addendum)
Patient had sustained SVT in the 180's, during this time MD was contacted. Patient's heart rate remained elevated 8-10 minutes before patient's heart rate eventually returned to normal sinus rate in the 90's without intervention.  However, spoke to MD about having medication available prn tachycardia.

## 2013-05-10 NOTE — Progress Notes (Signed)
Patient: [name] [name] Date of Encounter: 05/10/2013, 8:04 AM Admit date: 05/09/2013     Subjective  [name] [name] reports DOE and fatigue. He denies CP, SOB at rest or palpitations.   Objective  Physical Exam: Vitals: BP 96/53  Pulse 96  Temp(Src) 98.1 F (36.7 C) (Oral)  Resp 18  Ht 5\' 11"  (1.803 m)  Wt 211 lb 10.3 oz (96 kg)  BMI 29.53 kg/m2  SpO2 93% General: Well developed 72 year old male in no acute distress. Neck: Supple. No JVD. Lungs: Bibasilar rales noted. No wheezes or rhonchi. Breathing is unlabored. Heart: Regular S1 S2 with soft diastolic murmur at RUSB. No rub or gallop.  Abdomen: Soft, non-distended. Extremities: No clubbing or cyanosis. No edema.  Distal pedal pulses are 2+ and equal bilaterally. Neuro: Alert and oriented X 3. Moves all extremities spontaneously. No focal deficits.  Intake/Output:  Intake/Output Summary (Last 24 hours) at 05/10/13 0804 Last data filed at 05/10/13 0400  Gross per 24 hour  Intake   1163 ml  Output   2725 ml  Net  -1562 ml    Inpatient Medications:  . amiodarone  200 mg Oral BID  . amLODipine  5 mg Oral Daily  . atorvastatin  10 mg Oral q1800  . furosemide  40 mg Intravenous BID  . omega-3 acid ethyl esters  1 g Oral TID  . potassium chloride SA  40 mEq Oral Daily  . sodium chloride  3 mL Intravenous Q12H  . warfarin  2.5 mg Oral ONCE-1800  . Warfarin - Pharmacist Dosing Inpatient   Does not apply q1800    Labs:  Recent Labs  05/09/13 1430 05/10/13 0200  NA 134* 134*  K 4.0 3.7  CL 97 98  CO2 26 25  GLUCOSE 188* 112*  BUN 26* 25*  CREATININE 1.63* 1.51*  CALCIUM 9.1 8.6  MG 2.4  --     Recent Labs  05/09/13 1430  AST 34  ALT 32  ALKPHOS 69  BILITOT 0.7  PROT 7.3  ALBUMIN 3.2*    Recent Labs  05/09/13 1430  WBC 7.8  NEUTROABS 5.7  HGB 8.5*  HCT 27.5*  MCV 77.0*  PLT 395    Recent Labs  05/09/13 1430 05/09/13 2000 05/10/13 0200  TROPONINI 0.39* 0.38* 0.37*    Recent Labs  05/09/13 1430  TSH 0.792    Recent Labs  05/10/13 0200  INR 2.18*   Blood cultures pending   Radiology/Studies: X-ray Chest Pa And Lateral 05/09/2013   *RADIOLOGY REPORT*  Clinical Data: Congestive failure  CHEST - 2 VIEW  Comparison: 11/17/2012  Findings: Cardiac shadow is mildly enlarged.  Postsurgical changes are again seen.  Blunting of the costophrenic angles is noted bilaterally new from prior exam.  Scattered calcified granulomas are seen.  No focal confluent infiltrate is seen.  IMPRESSION: Small bilateral pleural effusions with mild bibasilar atelectasis. No focal confluent infiltrate is seen.   Original Report Authenticated By: Alcide Clever, M.D.    Transesophageal echocardiogram: most recent echo, from March 2013 Study Conclusions - Left ventricle: The cavity size was normal. Wall thickness was normal. Systolic function was normal. - Aortic valve: Prosthetic AV with mobile leaflets poorly visualized due to shadowing. There is asignificant perivalvular leak on both sides of the valve prosthesis at least moderate to severe in severity - Mitral valve: No evidence of vegetation. Mild regurgitation. - Left atrium: No evidence of thrombus in the atrial cavity or appendage. - Right  atrium: No evidence of thrombus in the atrial cavity or appendage. - Tricuspid valve: No evidence of vegetation. - Pulmonic valve: No evidence of vegetation.  Telemetry: atrial flutter, V rate in 90s    Assessment and Plan  1. Recurrent atrial flutter - waiting for recent INRs from [facility] to ensure he has been therapeutic - treatment options for atrial flutter including medicine and ablation were discussed in detail again today with [name] [name] and his wife - considering EPS +RF ablation but not ready to make a decision yet; will need to improve / stablilize other issues prior to procedure, specifically anemia and HF - most recent INRs from [facility]: 04/23/2013 - 2.3, 03/28/2013 - 2.2, 03/21/2013 -  4.2, 02/07/2013  3.0 2. Acute on chronic diastolic HF 3. OSA - recently diagnosed; untreated currently 4. Anemia - unknown etiology; agree with Hematology consult 5. AS s/p tissue AVR 2010, now with known perivalvular leak - last echo March 2013; will need updated echo 6. Preserved LVEF 7. AKI 8. Weight loss  Dr. Johney Frame to see Signed, EDMISTEN, BROOKE PA-C  I have seen, examined the patient, and reviewed the above assessment and plan.  Changes to above are made where necessary.  The patient remains in atrial flutter.  I have spoken with Dr Katrinka Blazing who feels that sinus rhythm is necessary in order to treat CHF symptoms.  Therapeutic strategies for atrial flutter including medicine and ablation were discussed in detail with the patient and his spouse today. Risk, benefits, and alternatives to EP study and radiofrequency ablation were also discussed in detail today. These risks include but are not limited to stroke, bleeding, vascular damage, tamponade, perforation, damage to the heart and other structures, AV block requiring pacemaker, worsening renal function, and death. The patient understands these risk and wishes to proceed.  He has been adequately anticoagulated for at least 4 weeks.  We will therefore proceed with ablation at the next available time.  He is scheduled for tomorrow morning.  Co Sign: Hillis Range, MD 05/10/2013 9:05 PM

## 2013-05-10 NOTE — Progress Notes (Signed)
Patient's heart rate returned to 180's and upon entering room found that patient was tachypneic.  Looked for new medication order as discussed with MD, did not find that so obtained order from [facility] MD.  Instructed patient in valsalva maneuver and called pharmacy.  Retrieved Metoprolol 5mg  and proceeded to administer it when patient lost consciousness; rhythm read vtach/vfib, no pulse palpable.  CPR and bag-mask ventilation initiated, conversations continuing between [place] and Charity fundraiser, team arrives.  Patient regains brady rhythm first after resuscitative efforts and patient intubated for transfer to ICU.  Oncall MD updated.  Patient's wife was contacted.

## 2013-05-10 NOTE — Progress Notes (Signed)
He has not been seen by Hematology yet. He will have ablation in AM. INR's from [facility]: 02/07/13 = 3.0; 03/21/13 =4.2; 03/28/13= 2.2; 04/23/13 = 2.3 Will increase the Furosemide to 80 mg IV every 8 hours.

## 2013-05-10 NOTE — Progress Notes (Signed)
eLink Physician-Brief Progress Note Patient Name: [name] [name] DOB: 1941-01-11 MRN: [id number]  Date of Service  05/10/2013   HPI/Events of Note   SVT On Amiodarone   eICU Interventions   Valsalva Metoprolol    Intervention Category Major Interventions: Arrhythmia - evaluation and management  Labrenda Lasky 05/10/2013, 10:51 PM

## 2013-05-10 NOTE — Progress Notes (Signed)
States up to use urinal  And heart rate increased to 180. Elink called into room. States she would let the MD know. RR notified but heart rate gradually returned to 80's -90's. No c/o chest pain. Instructed pat. Not to get up alone to call for assistance. NPO after midnight for Ablation.

## 2013-05-10 NOTE — Progress Notes (Addendum)
Patient Name: [name] [name] Date of Encounter: 05/10/2013    SUBJECTIVE: No significant problems overnight. The patient is very dyspneic and fatigued with minimal activity.   In review of the patient's outpatient records and with further history, there has been significant weight loss, he has an unexplained anemia, and as noted has had a perivalvular leak since early after aortic valve replacement. His current presentation is multifactorial with culminating of that resulting in admission being the development of atrial flutter.  His troponin is mildly elevated but he has not had any chest discomfort.  TELEMETRY:  Atrial flutter with poor rate control and heart rate at 98 beats per minute while in bed quickly increasing into the 120s with activity.: Filed Vitals:   05/09/13 2000 05/10/13 0000 05/10/13 0400 05/10/13 0500  BP: 124/58 116/48 96/53   Pulse: 96 96 96   Temp: 98.8 F (37.1 C) 98.6 F (37 C) 98.1 F (36.7 C)   TempSrc: Oral Oral Oral   Resp: 23 20 18    Height:      Weight:    96 kg (211 lb 10.3 oz)  SpO2: 93% 92% 93%     Intake/Output Summary (Last 24 hours) at 05/10/13 0631 Last data filed at 05/10/13 0400  Gross per 24 hour  Intake   1163 ml  Output   2725 ml  Net  -1562 ml    LABS: Basic Metabolic Panel:  Recent Labs  27/25/36 1430 05/10/13 0200  NA 134* 134*  K 4.0 3.7  CL 97 98  CO2 26 25  GLUCOSE 188* 112*  BUN 26* 25*  CREATININE 1.63* 1.51*  CALCIUM 9.1 8.6  MG 2.4  --    CBC:  Recent Labs  05/09/13 1430  WBC 7.8  NEUTROABS 5.7  HGB 8.5*  HCT 27.5*  MCV 77.0*  PLT 395   Cardiac Enzymes:  Recent Labs  05/09/13 1430 05/09/13 2000 05/10/13 0200  TROPONINI 0.39* 0.38* 0.37*   BNP    Component Value Date/Time   PROBNP 2487.0* 05/09/2013 1430     Radiology/Studies:  Bilateral pleural effusions  Physical Exam: Blood pressure 96/53, pulse 96, temperature 98.1 F (36.7 C), temperature source Oral, resp. rate 18, height 5\' 11"   (1.803 m), weight 96 kg (211 lb 10.3 oz), SpO2 93.00%. Weight change:    Gaunt appearing but in no acute distress while lying at 45 degrees in bed.  Elevated neck veins with flutter waves noted  Diminished basilar breath sounds  Cardiac exam reveals a regular rapid rhythm with a diastolic murmur of aortic regurgitation.  Decrease in edema since admission  Neurological exam is unchanged. The patient always seems sluggish in conversation.  ASSESSMENT: 1. Typical atrial flutter aggravating diastolic heart failure. INR's our therapeutic.  2. Severe anemia, etiology of which has not been determined. Considerations include GI blood loss, hemolytic anemia related to the prosthetic valve, and with the weight loss the possibility of a myeloproliferative or lymphoproliferative disorder will be considered.  3. Acute on chronic diastolic heart failure, multifactorial including aortic regurgitation, severe anemia, and recent atrial flutter  4. Aortic regurgitation has been chronically present since early after surgery. With weight loss we need to consider endocarditis.  5. Chronic kidney disease stage III  Plan:  1. I hope that admission would help [place] to expedite therapy of atrial flutter. I would be in favor of flutter ablation. The patient and wife need time to consider this option. They are frightened. Perhaps our best option at this  time is cardioversion. We'll rediscuss at length today.  2. Hematology consult  3. Consider transfusion  4. Continue anticoagulation  5. Continue IV diuresis  Prolonged services with family discussion and obtaining appropriate consultative services. Selinda Eon 05/10/2013, 6:31 AM

## 2013-05-10 NOTE — Progress Notes (Addendum)
ANTICOAGULATION CONSULT NOTE   Pharmacy Consult for warfarin  Indication: atrial fibrillation  No Known Allergies  Patient Measurements: Height: 5\' 11"  (180.3 cm) Weight: 211 lb 10.3 oz (96 kg) IBW/kg (Calculated) : 75.3   Vital Signs: Temp: 98.9 F (37.2 C) (09/04 0806) Temp src: Oral (09/04 0806) BP: 126/57 mmHg (09/04 0934) Pulse Rate: 97 (09/04 0806)  Labs:  Recent Labs  05/09/13 1430 05/09/13 2000 05/10/13 0200  HGB 8.5*  --   --   HCT 27.5*  --   --   PLT 395  --   --   LABPROT 22.7*  --  23.6*  INR 2.08*  --  2.18*  CREATININE 1.63*  --  1.51*  TROPONINI 0.39* 0.38* 0.37*    Estimated Creatinine Clearance: 53.8 ml/min (by C-G formula based on Cr of 1.51).   Medical History: Past Medical History  Diagnosis Date  . Coronary artery disease   . Angina   . Hypertension   . Atrial flutter   . Obstructive sleep apnea     severe   Assessment: 72 year old male with hx of afib on chronic coumadin. INR therapeutic at 2.18 this am. No bleeding issues noted. Patient considering options on cardioversion verus ablation at this time. Will give slightly higher dose of warfarin to ensure that he stays in range in case dccv is done.  It appears that warfarin was not given last night d/t possibility of a cath being done.    Goal of Therapy:  INR 2-3 Monitor platelets by anticoagulation protocol: Yes   Plan:  Warfarin 5mg  tonight INR daily  Sheppard Coil PharmD., BCPS Clinical Pharmacist Pager [phone] 05/10/2013 10:07 AM

## 2013-05-11 ENCOUNTER — Inpatient Hospital Stay (HOSPITAL_COMMUNITY): Payer: Medicare Other

## 2013-05-11 ENCOUNTER — Encounter (HOSPITAL_COMMUNITY)
Admission: AD | Disposition: A | Discharge status: Home / Self Care | Payer: Self-pay | Source: Ambulatory Visit | Attending: Interventional Cardiology

## 2013-05-11 DIAGNOSIS — J9601 Acute respiratory failure with hypoxia: Secondary | ICD-10-CM | POA: Diagnosis not present

## 2013-05-11 DIAGNOSIS — I472 Ventricular tachycardia: Secondary | ICD-10-CM | POA: Diagnosis not present

## 2013-05-11 DIAGNOSIS — J9600 Acute respiratory failure, unspecified whether with hypoxia or hypercapnia: Secondary | ICD-10-CM

## 2013-05-11 DIAGNOSIS — I4892 Unspecified atrial flutter: Principal | ICD-10-CM

## 2013-05-11 DIAGNOSIS — J811 Chronic pulmonary edema: Secondary | ICD-10-CM

## 2013-05-11 DIAGNOSIS — I469 Cardiac arrest, cause unspecified: Secondary | ICD-10-CM

## 2013-05-11 LAB — COMPREHENSIVE METABOLIC PANEL
ALT: 33 U/L (ref 0–53)
AST: 34 U/L (ref 0–37)
Alkaline Phosphatase: 95 U/L (ref 39–117)
CO2: 18 mEq/L — ABNORMAL LOW (ref 19–32)
Calcium: 8.6 mg/dL (ref 8.4–10.5)
Chloride: 94 mEq/L — ABNORMAL LOW (ref 96–112)
GFR calc non Af Amer: 37 mL/min — ABNORMAL LOW (ref >90)
Potassium: 3.8 mEq/L (ref 3.5–5.1)
Sodium: 131 mEq/L — ABNORMAL LOW (ref 135–145)

## 2013-05-11 LAB — POCT I-STAT 3, ART BLOOD GAS (G3+)
Acid-base deficit: 8 mmol/L — ABNORMAL HIGH (ref 0.0–2.0)
Bicarbonate: 18.3 mEq/L — ABNORMAL LOW (ref 20.0–24.0)
Patient temperature: 98.8
Patient temperature: 98.8
pCO2 arterial: 29 mmHg — ABNORMAL LOW (ref 35.0–45.0)
pCO2 arterial: 41.5 mmHg (ref 35.0–45.0)
pH, Arterial: 7.482 — ABNORMAL HIGH (ref 7.350–7.450)
pH, Arterial: 7.423 (ref 7.350–7.450)
pO2, Arterial: 74 mmHg — ABNORMAL LOW (ref 80.0–100.0)

## 2013-05-11 LAB — CBC
HCT: 29.5 % — ABNORMAL LOW (ref 39.0–52.0)
HCT: 29.4 % — ABNORMAL LOW (ref 39.0–52.0)
Hemoglobin: 8.9 g/dL — ABNORMAL LOW (ref 13.0–17.0)
Hemoglobin: 9.1 g/dL — ABNORMAL LOW (ref 13.0–17.0)
MCV: 78.7 fL (ref 78.0–100.0)
MCV: 76.6 fL — ABNORMAL LOW (ref 78.0–100.0)
RBC: 3.75 MIL/uL — ABNORMAL LOW (ref 4.22–5.81)
WBC: 15.5 10*3/uL — ABNORMAL HIGH (ref 4.0–10.5)
WBC: 14 10*3/uL — ABNORMAL HIGH (ref 4.0–10.5)

## 2013-05-11 LAB — TROPONIN I: Troponin I: 0.89 ng/mL (ref <0.30)

## 2013-05-11 LAB — BASIC METABOLIC PANEL
BUN: 29 mg/dL — ABNORMAL HIGH (ref 6–23)
CO2: 24 mEq/L (ref 19–32)
Glucose, Bld: 275 mg/dL — ABNORMAL HIGH (ref 70–99)
Potassium: 4.8 mEq/L (ref 3.5–5.1)
Sodium: 130 mEq/L — ABNORMAL LOW (ref 135–145)

## 2013-05-11 LAB — HAPTOGLOBIN: Haptoglobin: 214 mg/dL — ABNORMAL HIGH (ref 45–215)

## 2013-05-11 LAB — MAGNESIUM: Magnesium: 2.5 mg/dL (ref 1.5–2.5)

## 2013-05-11 LAB — GLUCOSE, CAPILLARY: Glucose-Capillary: 187 mg/dL — ABNORMAL HIGH (ref 70–99)

## 2013-05-11 LAB — HEMOGLOBIN A1C
Hgb A1c MFr Bld: 6.8 % — ABNORMAL HIGH (ref <5.7)
Mean Plasma Glucose: 148 mg/dL — ABNORMAL HIGH (ref <117)

## 2013-05-11 LAB — PROTIME-INR: INR: 2.45 — ABNORMAL HIGH (ref 0.00–1.49)

## 2013-05-11 SURGERY — ATRIAL FLUTTER ABLATION
Anesthesia: Monitor Anesthesia Care

## 2013-05-11 MED ORDER — MIDAZOLAM HCL 2 MG/2ML IJ SOLN
1.0000 mg | INTRAMUSCULAR | Status: DC | PRN
  Administered 2013-05-11: 2 mg via INTRAVENOUS
  Administered 2013-05-11: 1 mg via INTRAVENOUS
  Filled 2013-05-11 (×2): qty 2

## 2013-05-11 MED ORDER — AMIODARONE HCL IN DEXTROSE 360-4.14 MG/200ML-% IV SOLN
30.0000 mg/h | INTRAVENOUS | Status: DC
  Administered 2013-05-11 – 2013-05-12 (×4): 30 mg/h via INTRAVENOUS
  Filled 2013-05-11 (×10): qty 200

## 2013-05-11 MED ORDER — LEVALBUTEROL HCL 0.63 MG/3ML IN NEBU
0.6300 mg | INHALATION_SOLUTION | Freq: Four times a day (QID) | RESPIRATORY_TRACT | Status: DC
  Administered 2013-05-11 – 2013-05-12 (×5): 0.63 mg via RESPIRATORY_TRACT
  Filled 2013-05-11 (×9): qty 3

## 2013-05-11 MED ORDER — ALBUTEROL SULFATE (5 MG/ML) 0.5% IN NEBU
INHALATION_SOLUTION | RESPIRATORY_TRACT | Status: AC
  Filled 2013-05-11: qty 0.5

## 2013-05-11 MED ORDER — SODIUM CHLORIDE 0.9 % IV SOLN
25.0000 ug/h | INTRAVENOUS | Status: DC
  Administered 2013-05-11: 50 ug/h via INTRAVENOUS
  Filled 2013-05-11: qty 50

## 2013-05-11 MED ORDER — IPRATROPIUM BROMIDE 0.02 % IN SOLN
0.5000 mg | Freq: Four times a day (QID) | RESPIRATORY_TRACT | Status: DC
  Administered 2013-05-11 – 2013-05-12 (×5): 0.5 mg via RESPIRATORY_TRACT
  Filled 2013-05-11 (×4): qty 2.5

## 2013-05-11 MED ORDER — WARFARIN SODIUM 2.5 MG PO TABS
2.5000 mg | ORAL_TABLET | Freq: Once | ORAL | Status: AC
  Administered 2013-05-11: 2.5 mg via ORAL
  Filled 2013-05-11: qty 1

## 2013-05-11 MED ORDER — FUROSEMIDE 10 MG/ML IJ SOLN
INTRAMUSCULAR | Status: AC
  Filled 2013-05-11: qty 8

## 2013-05-11 MED ORDER — LEVALBUTEROL HCL 0.63 MG/3ML IN NEBU: 0.6300 mg | INHALATION_SOLUTION | RESPIRATORY_TRACT | Status: DC | PRN

## 2013-05-11 MED ORDER — FENTANYL BOLUS VIA INFUSION
25.0000 ug | Freq: Four times a day (QID) | INTRAVENOUS | Status: DC | PRN
  Filled 2013-05-11: qty 100

## 2013-05-11 MED ORDER — AMIODARONE HCL IN DEXTROSE 360-4.14 MG/200ML-% IV SOLN
60.0000 mg/h | INTRAVENOUS | Status: AC
  Administered 2013-05-11 (×2): 60 mg/h via INTRAVENOUS
  Filled 2013-05-11 (×2): qty 200

## 2013-05-11 MED ORDER — AMIODARONE LOAD VIA INFUSION
150.0000 mg | Freq: Once | INTRAVENOUS | Status: AC
  Administered 2013-05-11: 150 mg via INTRAVENOUS
  Filled 2013-05-11: qty 83.34

## 2013-05-11 MED ORDER — INSULIN ASPART 100 UNIT/ML ~~LOC~~ SOLN
0.0000 [IU] | Freq: Three times a day (TID) | SUBCUTANEOUS | Status: DC
  Administered 2013-05-11: 2 [IU] via SUBCUTANEOUS
  Administered 2013-05-11: 3 [IU] via SUBCUTANEOUS
  Administered 2013-05-12 (×2): 2 [IU] via SUBCUTANEOUS
  Administered 2013-05-13: 5 [IU] via SUBCUTANEOUS
  Administered 2013-05-13 (×2): 3 [IU] via SUBCUTANEOUS
  Administered 2013-05-14 (×3): 2 [IU] via SUBCUTANEOUS
  Administered 2013-05-16 (×3): 3 [IU] via SUBCUTANEOUS
  Administered 2013-05-17 (×2): via SUBCUTANEOUS
  Administered 2013-05-17: 18:00:00 3 [IU] via SUBCUTANEOUS
  Administered 2013-05-18: 2 [IU] via SUBCUTANEOUS

## 2013-05-11 MED ORDER — IPRATROPIUM BROMIDE 0.02 % IN SOLN
RESPIRATORY_TRACT | Status: AC
  Administered 2013-05-11: 0.5 mg via RESPIRATORY_TRACT
  Filled 2013-05-11: qty 2.5

## 2013-05-11 MED ORDER — SODIUM CHLORIDE 0.9 % IV SOLN: INTRAVENOUS | Status: DC

## 2013-05-11 MED FILL — Medication: Qty: 1 | Status: AC

## 2013-05-11 NOTE — Procedures (Signed)
Central Venous Catheter Insertion Procedure Note CORDERIUS SARACENI [id number] [birth year]  Procedure: Insertion of Central Venous Catheter Indications: Assessment of intravascular volume and Drug and/or fluid administration  Procedure Details Consent: Risks of procedure as well as the alternatives and risks of each were explained to the (patient/caregiver).  Consent for procedure obtained. Time Out: Verified patient identification, verified procedure, site/side was marked, verified correct patient position, special equipment/implants available, medications/allergies/relevent history reviewed, required imaging and test results available.  Performed  Maximum sterile technique was used including antiseptics, cap, gloves, gown, hand hygiene, mask and sheet. Skin prep: Chlorhexidine; local anesthetic administered A antimicrobial bonded/coated triple lumen catheter was placed in the left subclavian vein using the Seldinger technique.  Ultrasound was used to verify the patency of the vein and for real time needle guidance.  Evaluation Blood flow good Complications: No apparent complications Patient did tolerate procedure well. Chest X-ray ordered to verify placement.  CXR: normal.  MCQUAID, DOUGLAS 05/11/2013, 12:28 AM

## 2013-05-11 NOTE — Progress Notes (Signed)
Patient Name: DAISUKE BAILEY Date of Encounter: 05/11/2013    SUBJECTIVE: The patient suffered ventricular fibrillation and had to be resuscitated the last evening. I admitted him to the hospital in atrial flutter with 2:1 AV block at rates of 98 beats per minute. He apparently began conducting 1:1 last evening, and degenerated into VF. He did receive CPR and was successfully shocked out of it. He was intubated. He is apparently the loud to awaken from sedation and can follow commands.   TELEMETRY:  Normal sinus rhythm: Filed Vitals:   05/11/13 1015 05/11/13 1030 05/11/13 1100 05/11/13 1115  BP: 163/78 162/66 170/73 164/69  Pulse: 96 94 88 95  Temp:      TempSrc:      Resp: 19 22 15 21   Height:      Weight:      SpO2: 93% 94% 95% 94%    Intake/Output Summary (Last 24 hours) at 05/11/13 1134 Last data filed at 05/11/13 1000  Gross per 24 hour  Intake 537.22 ml  Output   1075 ml  Net -537.78 ml    LABS: Basic Metabolic Panel:  Recent Labs  16/10/96 1430  05/10/13 2320 05/11/13 0500  NA 134*  < > 131* 130*  K 4.0  < > 3.8 4.8  CL 97  < > 94* 94*  CO2 26  < > 18* 24  GLUCOSE 188*  < > 340* 275*  BUN 26*  < > 24* 29*  CREATININE 1.63*  < > 1.79* 2.39*  CALCIUM 9.1  < > 8.6 8.9  MG 2.4  --  2.5  --   < > = values in this interval not displayed. CBC:  Recent Labs  05/09/13 1430 05/10/13 2320 05/11/13 0500  WBC 7.8 15.5* 14.0*  NEUTROABS 5.7  --   --   HGB 8.5* 8.9* 9.1*  HCT 27.5* 29.5* 29.4*  MCV 77.0* 78.7 76.6*  PLT 395 493* 435*   Cardiac Enzymes:  Recent Labs  05/10/13 0200 05/10/13 2320 05/11/13 0500  TROPONINI 0.37* <0.30 1.06*   Radiology/Studies:  CHF on portable chest x-ray done post arrest with moderate size right pleural effusion  ECG: post arrest there are no ischemic changes. The patient has an ectopic atrial rhythm at a rate of 80 beats per minute Physical Exam: Blood pressure 164/69, pulse 95, temperature 98.4 F (36.9 C), temperature  source Oral, resp. rate 21, height 5\' 11"  (1.803 m), weight 98.9 kg (218 lb 0.6 oz), SpO2 94.00%. Weight change: 3.055 kg (6 lb 11.8 oz)   Intubated. Sedated.  2/6 decrescendo murmur of aortic regurgitation. Unchanged from prior exams.  Decreased basilar breath sounds. No wheezing is heard.  Abdomen soft.  Bilateral lower extremity edema.  ASSESSMENT:  1. Ventricular fibrillation resulting from degeneration of atrial flutter with 1:1 conduction.  2. 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 Acute kidney injury  4. Severe anemia etiology uncertain. Hematology consult requested but not performed. Will recontact  5. 30 pound weight loss   Plan:  1. Continue IV amiodarone  2. Continue diuresis and hopefully renal function and heart failure improved now that atrial flutter has reverted to atrial rhythm.  3. Post arrest. Follow neurological status post extubation.  4. Atrial flutter ablation early next week  5. To stay in the unit until atrial flutter ablation is completed  6. Still needs an anemia evaluation.  7. Guarded overall prognosis. Have kept family closely informed.  Signed, Veatrice Kells  W 05/11/2013, 11:34 AM

## 2013-05-11 NOTE — Progress Notes (Addendum)
150cc of fentanyl wasted from a 2500cc bag. Witnessed per two RN's  Georgina Peer RN

## 2013-05-11 NOTE — Procedures (Signed)
Extubation Procedure Note  Patient Details:   Name: [name] [name] DOB: February 07, 1941 MRN: [id number]   Airway Documentation:  Airway 7.5 mm (Active)  Secured at (cm) 22 cm 05/11/2013  7:40 AM  Measured From Teeth 05/11/2013  7:40 AM  Secured Location Left 05/11/2013  7:40 AM  Secured By [employer] 05/11/2013  7:40 AM  Tube Holder Repositioned Yes 05/11/2013  7:40 AM  Cuff Pressure (cm H2O) 25 cm H2O 05/10/2013 11:35 PM    Evaluation  O2 sats: transiently fell during during procedure, currently acceptable and Dr. Sung Amabile aware.  Pt currently on 100% NRB with SpO2 of 93%. Complications: No apparent complications Patient did tolerate procedure well. Bilateral Breath Sounds: Clear;Diminished Suctioning: Airway Yes  Prior to extubation: Pt suctioned orally and via ETT.  Post-extubation:  Pt able to state his name, cough and produce sputum, and no stridor noted.  [name] 05/11/2013, 10:19 AM

## 2013-05-11 NOTE — Procedures (Addendum)
Intubation Procedure Note [name] [name] [id number] 27-Dec-1940  Procedure: Intubation Indications: Respiratory insufficiency  Procedure Details Consent: Unable to obtain consent because of emergent medical necessity. Time Out: Verified patient identification, verified procedure, site/side was marked, verified correct patient position, special equipment/implants available, medications/allergies/relevent history reviewed, required imaging and test results available.  Performed  Drugs Etomidate 20mg  IV, Rocuronium 70mg  IV DL x 2 with MAC 4 blade Grade 3 view 7.5 ET tube passed through cords under direct visualization Placement confirmed with bilateral breath sounds, positive EtCO2 change and smoke in tube   Evaluation Hemodynamic Status: BP stable throughout; O2 sats: transiently fell during during procedure Patient's Current Condition: stable Complications: No apparent complications Patient did tolerate procedure well. Chest X-ray ordered to verify placement.  CXR: tube position acceptable.  Intubation performed by April Hunt RT under my direct supervision (I was at bedside).  MCQUAID, DOUGLAS 05/11/2013

## 2013-05-11 NOTE — Progress Notes (Signed)
Inpatient Diabetes Program Recommendations  AACE/ADA: New Consensus Statement on Inpatient Glycemic Control (2013)  Target Ranges:  Prepandial:   less than 140 mg/dL      Peak postprandial:   less than 180 mg/dL (1-2 hours)      Critically ill patients:  140 - 180 mg/dL   Reason for Assessment: Elevations in lab glucose  Note:  Results for [name] [name], [name] [name] (MRN [id number]) as of 05/11/2013 09:45  Ref. Range 05/09/2013 14:30 05/10/2013 02:00 05/10/2013 23:20 05/11/2013 05:00  Glucose Latest Range: 70-99 mg/dL 086 (H) 578 (H) 469 (H) 275 (H)   Back in March of 2013, patient had a Hgb A1C of 7.0-- which is indicative of diabetes.  Do not see a diagnosis or history of diabetes in H & P or on Hospital Problem List.  Request MD complete the Glycemic Control Order Set and order a Hgb A1C.  Potential diagnosis of diabetes needs to be addressed.  If patient has diagnosis of diabetes, the American Diabetes Association recommends that patients with diabetes have CBG's checked at least four times a day while in the hospital setting.  Please order CBG's ac & hs while eating or if patient becomes NPO, every 4 hours.  Even if patient deemed non-diabetic, needs CBG's and correction to address hyperglycemia during this stressed time. Thank you.  Alexya Mcdaris S. Elsie Lincoln, RN, CNS, CDE Inpatient Diabetes Program, team pager [phone]

## 2013-05-11 NOTE — Progress Notes (Signed)
Chaplain made follow-up visit to check on patient's family in the wake of the code last night. Patient's wife was present and holding the patient's han. She said he was doing much better and that she was all right. She did not seem to have further needs at this time.

## 2013-05-11 NOTE — Progress Notes (Signed)
05/11/13 0000  Clinical Encounter Type  Visited With Family;Patient not available;Health care provider (wife)  Visit Type Follow-up  Referral From Nurse (Mae)   Paged by RN to support [name] [name] wife.  She was on the phone, preferred privacy at that time, and is aware of ongoing chaplain availability.  Will refer to day chaplain for follow-up support.  [address] [place], [place] [phone]

## 2013-05-11 NOTE — Plan of Care (Signed)
Code Event  Mr. Ledet is admitted for further management of atrial flutter.  He is planned for EPS and RFA.  Patient unfortunately had cardiac arrest around 2200 for which he underwent ACLS, CPR and DCCV.  He had ROSC and is now intubated and transferred from stepdown to [facility].  Prior to event, patient was reported in SVT at 180 BPM.  No rhythm strips are available at this time.  Concern that patient may have been in atrial flutter with 1:1 conduction which degenerated into Ventricular tachycardia.  Assessment S/p Cardiac arrest Possible atrial flutter with 1:1 conduction Ventricular Tachycardia Respiratory Failure CHF  Plan: -Will try to obtain strips from central telemetry -Continue IV Amiodarone -Mech Vent -Appreciate PCCM -Will evaluate mental status and discussed plan for hypothermia protocol. -Wife is present and has been updated of event and current clinical status.

## 2013-05-11 NOTE — Progress Notes (Signed)
Foley insertion note: Foley huddle performed. Peri care done before insertion. Sterile procedure maintained throughout insertion. Reason : pt unresponsive, intubated.

## 2013-05-11 NOTE — Progress Notes (Addendum)
Patient: [name] [name] Date of Encounter: 05/11/2013, 7:45 AM Admit date: 05/09/2013     Subjective  Mr. Daniello had 1:1 atrial flutter last night which degenerated into polymorphic VT requiring defibrillation.  He remains intubated but is clinically stable.   Objective  Physical Exam: Vitals: BP 130/57  Pulse 81  Temp(Src) 98.6 F (37 C) (Oral)  Resp 22  Ht 5\' 11"  (1.803 m)  Wt 218 lb 0.6 oz (98.9 kg)  BMI 30.42 kg/m2  SpO2 99% General:  Sedated on the vent ETT in place Neck: Supple. No JVD. Lungs: Bibasilar rales noted. No wheezes or rhonchi. Breathing is unlabored. Heart: Regular S1 S2 with soft diastolic murmur at RUSB. No rub or gallop.  Abdomen: Soft, non-distended. Extremities: No clubbing or cyanosis. No edema.  Distal pedal pulses are 2+ and equal bilaterally. Neuro: Alert and oriented X 3. Moves all extremities spontaneously. No focal deficits.  Intake/Output:  Intake/Output Summary (Last 24 hours) at 05/11/13 0745 Last data filed at 05/11/13 0700  Gross per 24 hour  Intake 892.12 ml  Output   1155 ml  Net -262.88 ml    Inpatient Medications:  . amiodarone  200 mg Oral BID  . amLODipine  5 mg Oral Daily  . antiseptic oral rinse  15 mL Mouth Rinse QID  . atorvastatin  10 mg Oral q1800  . chlorhexidine  15 mL Mouth Rinse BID  . furosemide  80 mg Intravenous Q6H  . omega-3 acid ethyl esters  1 g Oral TID  . pantoprazole (PROTONIX) IV  40 mg Intravenous Daily  . potassium chloride SA  40 mEq Oral Daily  . sodium chloride  3 mL Intravenous Q12H  . Warfarin - Pharmacist Dosing Inpatient   Does not apply q1800    Labs:  Recent Labs  05/09/13 1430  05/10/13 2320 05/11/13 0500  NA 134*  < > 131* 130*  K 4.0  < > 3.8 4.8  CL 97  < > 94* 94*  CO2 26  < > 18* 24  GLUCOSE 188*  < > 340* 275*  BUN 26*  < > 24* 29*  CREATININE 1.63*  < > 1.79* 2.39*  CALCIUM 9.1  < > 8.6 8.9  MG 2.4  --  2.5  --   < > = values in this interval not displayed.  Recent  Labs  05/09/13 1430 05/10/13 2320  AST 34 34  ALT 32 33  ALKPHOS 69 95  BILITOT 0.7 0.4  PROT 7.3 7.4  ALBUMIN 3.2* 3.0*    Recent Labs  05/09/13 1430 05/10/13 2320 05/11/13 0500  WBC 7.8 15.5* 14.0*  NEUTROABS 5.7  --   --   HGB 8.5* 8.9* 9.1*  HCT 27.5* 29.5* 29.4*  MCV 77.0* 78.7 76.6*  PLT 395 493* 435*    Recent Labs  05/09/13 2000 05/10/13 0200 05/10/13 2320 05/11/13 0500  TROPONINI 0.38* 0.37* <0.30 1.06*    Recent Labs  05/09/13 1430  TSH 0.792    Recent Labs  05/11/13 0500  INR 2.45*   Blood cultures pending   Radiology/Studies: X-ray Chest Pa And Lateral 05/09/2013   *RADIOLOGY REPORT*  Clinical Data: Congestive failure  CHEST - 2 VIEW  Comparison: 11/17/2012  Findings: Cardiac shadow is mildly enlarged.  Postsurgical changes are again seen.  Blunting of the costophrenic angles is noted bilaterally new from prior exam.  Scattered calcified granulomas are seen.  No focal confluent infiltrate is seen.  IMPRESSION: Small bilateral pleural  effusions with mild bibasilar atelectasis. No focal confluent infiltrate is seen.   Original Report Authenticated By: Alcide Clever, M.D.    Transesophageal echocardiogram: most recent echo, from March 2013 Study Conclusions - Left ventricle: The cavity size was normal. Wall thickness was normal. Systolic function was normal. - Aortic valve: Prosthetic AV with mobile leaflets poorly visualized due to shadowing. There is asignificant perivalvular leak on both sides of the valve prosthesis at least moderate to severe in severity - Mitral valve: No evidence of vegetation. Mild regurgitation. - Left atrium: No evidence of thrombus in the atrial cavity or appendage. - Right atrium: No evidence of thrombus in the atrial cavity or appendage. - Tricuspid valve: No evidence of vegetation. - Pulmonic valve: No evidence of vegetation.  Telemetry: atrial flutter, V rate in 90s    Assessment and Plan  1.  Polymorphic  VT Due to prolonged 1:1 atrial flutter with V rates 180s. Would extubate when able I think that the appropriate intervention will be ablation of his atrial flutter  2. Recurrent atrial flutter Will need to wait on ablation until he is a little more stable.  I will tentatively put him on the schedule for Tuesday.  His wife is very reluctant to consider a procedure before Tuesday.  Please keep INR 2-3 over the weekend.  3. Acute on chronic diastolic HF contnue gentle diuresis  4. OSA - recently diagnosed; untreated currently  5. Anemia - workup is ongoing  Hillis Range, MD 05/11/2013 7:45 AM

## 2013-05-11 NOTE — Progress Notes (Signed)
ANTICOAGULATION CONSULT NOTE - Follow Up Consult  Pharmacy Consult for Warfarin  Indication: atrial fibrillation  No Known Allergies  Patient Measurements: Height: 5\' 11"  (180.3 cm) Weight: 218 lb 0.6 oz (98.9 kg) IBW/kg (Calculated) : 75.3  Vital Signs: Temp: 98.4 F (36.9 C) (09/05 0800) Temp src: Oral (09/05 0800) BP: 131/59 mmHg (09/05 0800) Pulse Rate: 81 (09/05 0800)  Labs:  Recent Labs  05/09/13 1430  05/10/13 0200 05/10/13 2320 05/11/13 0500  HGB 8.5*  --   --  8.9* 9.1*  HCT 27.5*  --   --  29.5* 29.4*  PLT 395  --   --  493* 435*  LABPROT 22.7*  --  23.6*  --  25.8*  INR 2.08*  --  2.18*  --  2.45*  CREATININE 1.63*  --  1.51* 1.79* 2.39*  TROPONINI 0.39*  < > 0.37* <0.30 1.06*  < > = values in this interval not displayed.  Estimated Creatinine Clearance: 34.5 ml/min (by C-G formula based on Cr of 2.39).  Assessment: 72 y/o [name] with afib on chronic warfarin therapy. Pt s/p CODE BLUE 9/4 for VT requiring defibrillation. Plan is for ablation when more stable (early next week). Dr. Johney Frame wants to ensure INR remains 2-3 over the weekend in anticipation. Hgb 9.1 (from 8.9), Plts 435, Scr increased to 2.39<1.79 (likely from s/p arrest) with CrCl ~ 30-35. INR is 2.45<2.18. No overt bleeding noted. Warfarin was held on 9/3. Noted DDI with amiodarone.   NOTE: pt is intubated without per tube access, we no longer have IV warfarin, discussed with RN, hopefully pt will be extubated today. If not extubated, will need NG/OG access to give warfarin.   Goal of Therapy:  INR 2-3 Monitor platelets by anticoagulation protocol: Yes   Plan:  -Warfarin 2.5 mg PO x 1 tonight at 1800 -Daily PT/INR -Monitor for bleeding -F/U extubation, NG/OG access  Thank you for allowing me to take part in this patient's care,  [name] [name], PharmD Clinical Pharmacist Phone: [phone] Pager: [phone] 05/11/2013 8:51 AM

## 2013-05-11 NOTE — Progress Notes (Signed)
PULMONARY  / CRITICAL CARE MEDICINE  Name: [name] [name] MRN: [id number] DOB: 1940/12/09    ADMISSION DATE:  05/09/2013 CONSULTATION DATE:  05/10/2013  REFERRING MD :  Halina Andreas Cards PRIMARY SERVICE: [facility]  CHIEF COMPLAINT:  Cardiac arrest  BRIEF PATIENT DESCRIPTION: 72 y/o male with CAD and Aflutter was admitted on 05/09/2013 for management of Aflutter and unfortunately had a VTach arrest on 05/10/2013 requiring CPR.  SIGNIFICANT EVENTS / STUDIES:  9/3 Admission 9/4 VTach Arrest, 5-6 minutes CPR  LINES / TUBES: 9/4 ETT >> 9/05 9/5 CVL >>   CULTURES:   ANTIBIOTICS:   SUBJECTIVE:  RASS -1. + F/C. Extubated and no distress post extubation but a little lethargic   VITAL SIGNS: Temp:  [97.4 F (36.3 C)-98.8 F (37.1 C)] 98.4 F (36.9 C) (09/05 0800) Pulse Rate:  [68-112] 94 (09/05 1030) Resp:  [16-33] 22 (09/05 1030) BP: (106-182)/(53-87) 162/66 mmHg (09/05 1030) SpO2:  [85 %-100 %] 94 % (09/05 1030) FiO2 (%):  [40 %-100 %] 100 % (09/05 1015) Weight:  [98.9 kg (218 lb 0.6 oz)] 98.9 kg (218 lb 0.6 oz) (09/05 0500) HEMODYNAMICS:   VENTILATOR SETTINGS: Vent Mode:  [-] PRVC FiO2 (%):  [40 %-100 %] 100 % Set Rate:  [16 bmp-30 bmp] 22 bmp Vt Set:  [550 mL] 550 mL PEEP:  [5 cmH20-14 cmH20] 10 cmH20 Plateau Pressure:  [17 cmH20-27 cmH20] 22 cmH20 INTAKE / OUTPUT: Intake/Output     09/04 0701 - 09/05 0700 09/05 0701 - 09/06 0700   P.O. 480    I.V. (mL/kg) 412.1 (4.2) 174.9 (1.8)   Total Intake(mL/kg) 892.1 (9) 174.9 (1.8)   Urine (mL/kg/hr) 1155 (0.5) 145 (0.4)   Total Output 1155 145   Net -262.9 +29.9          PHYSICAL EXAMINATION:  Gen: lethargic HEENT: WNL  PULM: few scatteed rhonchi CV: RRR, + systolic M AB: BS+, soft, nontender, no hsm Ext: warm, trace pretibial edema, no clubbing, no cyanosis Neuro: MAEs, + F/C, no focal deficits   LABS:  CBC Recent Labs     05/09/13  1430  05/10/13  2320  05/11/13  0500  WBC  7.8  15.5*  14.0*   HGB  8.5*  8.9*  9.1*  HCT  27.5*  29.5*  29.4*  PLT  395  493*  435*   Coag's Recent Labs     05/09/13  1430  05/10/13  0200  05/11/13  0500  INR  2.08*  2.18*  2.45*   BMET Recent Labs     05/10/13  0200  05/10/13  2320  05/11/13  0500  NA  134*  131*  130*  K  3.7  3.8  4.8  CL  98  94*  94*  CO2  25  18*  24  BUN  25*  24*  29*  CREATININE  1.51*  1.79*  2.39*  GLUCOSE  112*  340*  275*   Electrolytes Recent Labs     05/09/13  1430  05/10/13  0200  05/10/13  2320  05/11/13  0500  CALCIUM  9.1  8.6  8.6  8.9  MG  2.4   --   2.5   --    Sepsis Markers No results found for this basename: LACTICACIDVEN, PROCALCITON, O2SATVEN,  in the last 72 hours ABG Recent Labs     05/11/13  0004  05/11/13  0221  05/11/13  0333  PHART  7.253*  7.482*  7.423  PCO2ART  41.5  29.0*  35.2  PO2ART  74.0*  190.0*  133.0*   Liver Enzymes Recent Labs     05/09/13  1430  05/10/13  2320  AST  34  34  ALT  32  33  ALKPHOS  69  95  BILITOT  0.7  0.4  ALBUMIN  3.2*  3.0*   Cardiac Enzymes Recent Labs     05/09/13  1430   05/10/13  0200  05/10/13  2320  05/11/13  0500  TROPONINI  0.39*   < >  0.37*  <0.30  1.06*  PROBNP  2487.0*   --    --    --    --    < > = values in this interval not displayed.   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 CAD Hypertension P:  Mgmt per Cards Ablation planned early next week On amiodarone infusion  RENAL A:  AKI CKD P:   Monitor BMET intermittently Correct electrolytes as indicated D/C scheduled  potassium   GASTROINTESTINAL A:  No acute issues P:   If tolerates extubation, begin diet  HEMATOLOGIC A:  Warfarin for Aflutter Anemia of uncertain etiology, no active bleeding P: Continue warfarin per pharm  INFECTIOUS A:  No acute issues P: Monitor off abx  ENDOCRINE A:  Hyperglycemia post code No prior hx of DM documented P:   Monitor CBGs  Begin SSI if glu > 180  NEUROLOGIC A:  Acute encephalopathy post arrest, resolved P:   Minimize sedatives  Code: Full  TODAY'S SUMMARY:   I have personally obtained a history, examined the patient, evaluated laboratory and imaging results, formulated the assessment and plan and placed orders. CRITICAL CARE: The patient is critically ill with multiple organ systems failure and requires high complexity decision making for assessment and support, frequent evaluation and titration of therapies, application of advanced monitoring technologies and extensive interpretation of multiple databases. Critical Care Time devoted to patient care services described in this note is 40 minutes.   Sharlot Gowda Pulmonary and Critical Care Medicine [facility] Pager: [phone]  05/11/2013, 10:44 AM

## 2013-05-12 DIAGNOSIS — N179 Acute kidney failure, unspecified: Secondary | ICD-10-CM

## 2013-05-12 DIAGNOSIS — E871 Hypo-osmolality and hyponatremia: Secondary | ICD-10-CM | POA: Diagnosis not present

## 2013-05-12 DIAGNOSIS — N189 Chronic kidney disease, unspecified: Secondary | ICD-10-CM

## 2013-05-12 LAB — PROTIME-INR: Prothrombin Time: 25.7 seconds — ABNORMAL HIGH (ref 11.6–15.2)

## 2013-05-12 LAB — CBC
MCH: 24 pg — ABNORMAL LOW (ref 26.0–34.0)
MCHC: 32 g/dL (ref 30.0–36.0)
MCV: 75.1 fL — ABNORMAL LOW (ref 78.0–100.0)
Platelets: 359 10*3/uL (ref 150–400)
RDW: 16.8 % — ABNORMAL HIGH (ref 11.5–15.5)

## 2013-05-12 LAB — POCT I-STAT 3, ART BLOOD GAS (G3+)
Acid-Base Excess: 2 mmol/L (ref 0.0–2.0)
Bicarbonate: 24.8 mEq/L — ABNORMAL HIGH (ref 20.0–24.0)
O2 Saturation: 88 %
pCO2 arterial: 32.7 mmHg — ABNORMAL LOW (ref 35.0–45.0)
pO2, Arterial: 50 mmHg — ABNORMAL LOW (ref 80.0–100.0)

## 2013-05-12 LAB — BASIC METABOLIC PANEL
BUN: 35 mg/dL — ABNORMAL HIGH (ref 6–23)
Calcium: 9.3 mg/dL (ref 8.4–10.5)
Creatinine, Ser: 2.39 mg/dL — ABNORMAL HIGH (ref 0.50–1.35)
GFR calc Af Amer: 30 mL/min — ABNORMAL LOW (ref >90)
GFR calc non Af Amer: 26 mL/min — ABNORMAL LOW (ref >90)

## 2013-05-12 LAB — GLUCOSE, CAPILLARY: Glucose-Capillary: 139 mg/dL — ABNORMAL HIGH (ref 70–99)

## 2013-05-12 MED ORDER — WARFARIN SODIUM 4 MG PO TABS
4.0000 mg | ORAL_TABLET | Freq: Once | ORAL | Status: AC
  Administered 2013-05-12: 4 mg via ORAL
  Filled 2013-05-12: qty 1

## 2013-05-12 MED ORDER — FUROSEMIDE 10 MG/ML IJ SOLN
80.0000 mg | Freq: Four times a day (QID) | INTRAMUSCULAR | Status: AC
  Administered 2013-05-12 – 2013-05-13 (×4): 80 mg via INTRAVENOUS
  Filled 2013-05-12 (×2): qty 8

## 2013-05-12 NOTE — Progress Notes (Signed)
eLink Physician-Brief Progress Note Patient Name: [name] [name] DOB: 30-May-1941 MRN: [id number]  Date of Service  05/12/2013   HPI/Events of Note   Hypoxia in context of pulmonary edema, extubated yesterday    eICU Interventions  Use BiPAP on prn basis      Feras Gardella 05/12/2013, 4:44 PM

## 2013-05-12 NOTE — Progress Notes (Signed)
Subjective:  In bed, no complaints, no CP. No SOB. On [place] O2  Objective:  Vital Signs in the last 24 hours: Temp:  [97.6 F (36.4 C)-98.6 F (37 C)] 98 F (36.7 C) (09/06 0800) Pulse Rate:  [41-92] 62 (09/06 1200) Resp:  [14-29] 22 (09/06 1200) BP: (116-169)/(38-67) 135/44 mmHg (09/06 1200) SpO2:  [83 %-98 %] 94 % (09/06 1200) FiO2 (%):  [100 %] 100 % (09/06 0400) Weight:  [96.6 kg (212 lb 15.4 oz)] 96.6 kg (212 lb 15.4 oz) (09/06 0500)  Intake/Output from previous day: 09/05 0701 - 09/06 0700 In: 1295.8 [P.O.:120; I.V.:1175.8] Out: 2165 [Urine:2165]   Physical Exam: General: Well developed, well nourished, in no acute distress. Head:  Normocephalic and atraumatic. [place] O2 Lungs: Clear to auscultation and percussion. Heart: Normal S1 and S2.  No murmur, rubs or gallops.  Abdomen: soft, non-tender, positive bowel sounds. Extremities: No clubbing or cyanosis. trace edema. Neurologic: Alert and oriented x 3.    Lab Results:  Recent Labs  05/11/13 0500 05/12/13 0423  WBC 14.0* 11.6*  HGB 9.1* 8.5*  PLT 435* 359    Recent Labs  05/11/13 0500 05/12/13 0423  NA 130* 133*  K 4.8 4.1  CL 94* 96  CO2 24 24  GLUCOSE 275* 134*  BUN 29* 35*  CREATININE 2.39* 2.39*    Recent Labs  05/11/13 0500 05/11/13 1144  TROPONINI 1.06* 0.89*   Hepatic Function Panel  Recent Labs  05/10/13 2320  PROT 7.4  ALBUMIN 3.0*  AST 34  ALT 33  ALKPHOS 95  BILITOT 0.4    Imaging: Portable Chest Xray In Am  05/11/2013   *RADIOLOGY REPORT*  Clinical Data: Endotracheal tube placement  PORTABLE CHEST - 1 VIEW  Comparison: 05/11/2013  Findings: Endotracheal tube remains in good position.  Central venous catheter tip remains in the SVC.  No pneumothorax.  Progression of right pleural effusion and atelectasis. Left lower lobe atelectasis and effusion unchanged.  There is mild pulmonary edema which is unchanged.  IMPRESSION: Congestive heart failure with enlarging right pleural effusion  and right lower lobe atelectasis.   Original Report Authenticated By: Janeece Riggers, M.D.   Dg Chest Port 1 View  05/11/2013   *RADIOLOGY REPORT*  Clinical Data: Status post intubation  PORTABLE CHEST - 1 VIEW  Comparison: Prior radiograph from 05/09/2013  Findings: Defibrillator paddle overlies the right chest.  There has been interval placement of an endotracheal tube with tip located 5 cm above the carina.  Cardiomegaly is grossly unchanged.  Lungs are normally inflated.  There is been mild worsening of diffuse pulmonary edema as compared to the prior examination. Dense opacity within the retrocardiac left lower lobe may represent atelectasis or consolidation. No pleural effusions are present.  No acute osseous abnormality.  IMPRESSION: 1.  Tip of the endotracheal tube 5 cm above the carina. 2.  Cardiomegaly with interval worsening of pulmonary edema. 3.  Bilateral pleural effusions.   Original Report Authenticated By: Rise Mu, M.D.   Dg Chest Port 1 View  05/11/2013   *RADIOLOGY REPORT*  Clinical Data: Central line placement  PORTABLE CHEST - 1 VIEW  Comparison: 05/10/2013 and 05/09/2013  Findings: There are changes of median sternotomy for CABG.  Zoll pads project over the chest.  The cardiopericardial silhouette is enlarged and a mechanical aortic valve is noted.  There is pulmonary vascular congestion with diffuse pulmonary edema and small bilateral pleural effusions.  No pneumothorax is visualized.  Left subclavian central venous catheter has  been placed, the tip of which is in the mid superior vena cava. Endotracheal tube remains in satisfactory position.  IMPRESSION:  1.  Left subclavian central venous catheter terminates in the mid superior vena cava.  Negative for pneumothorax. 2.  Congestive heart failure pattern with associated small bilateral pleural effusions.   Original Report Authenticated By: Britta Mccreedy, M.D.   Personally viewed.   Telemetry: No adverse arrhytmias. NSR  currently Personally viewed.  Cardiac Studies:  3/13 - normal EF  Assessment/Plan:   72 year old with following issues:  1) V fib arrest/ cardiac arrest  - currently stable on IV amio  - tomorrow will likely change to PO  - Brief CPR/ defib x 1. 05/10/13  - no signs of encephalopathy  2) Decompensated combined systolic and diastolic HF  - trying to diurese as renal function allows.   - IV lasix 80 Q6  - creat stable at 2.39  - Dr. Katrinka Blazing notes combined systolic and diastolic. Prior EF 65%  3) AFLUTTER  - awaiting ablation next week (possible Tuesday)  - prior 1:1 conduction >>>VTach  - anticoagulation  4) CAD  - CABG  5) Aortic valve replacement  - bioprosthetic  6) HTN  - amlodipine stable  7) Acute hypoxemic respiratory failure post arrest  Pulmonary edema   Needs more diuresis  Wean O2 for SpO2 > 92%  D/c scheduled BDs  Appreciate CCM care.    Improved but guarded prognosis.  Wife in room.   [name] [name] 05/12/2013, 12:22 PM

## 2013-05-12 NOTE — Progress Notes (Signed)
ANTICOAGULATION CONSULT NOTE - Follow Up Consult  Pharmacy Consult for Warfarin  Indication: atrial fibrillation  No Known Allergies  Patient Measurements: Height: 5\' 11"  (180.3 cm) Weight: 212 lb 15.4 oz (96.6 kg) IBW/kg (Calculated) : 75.3  Vital Signs: Temp: 98 F (36.7 C) (09/06 0400) Temp src: Oral (09/06 0400) BP: 142/46 mmHg (09/06 0700) Pulse Rate: 62 (09/06 0700)  Labs:  Recent Labs  05/10/13 0200 05/10/13 2320 05/11/13 0500 05/11/13 1144 05/12/13 0423  HGB  --  8.9* 9.1*  --  8.5*  HCT  --  29.5* 29.4*  --  26.6*  PLT  --  493* 435*  --  359  LABPROT 23.6*  --  25.8*  --  25.7*  INR 2.18*  --  2.45*  --  2.44*  CREATININE 1.51* 1.79* 2.39*  --  2.39*  TROPONINI 0.37* <0.30 1.06* 0.89*  --     Estimated Creatinine Clearance: 34.1 ml/min (by C-G formula based on Cr of 2.39).  Assessment: 72 y/o M with afib on chronic warfarin therapy. Pt s/p CODE BLUE 9/4 for VT requiring defibrillation. Plan is for ablation when more stable (early next week). Dr. Johney Frame wants to ensure INR remains 2-3 over the weekend in anticipation. Hgb 8.5<9.1, Plts 359, SCr 2.39<2.39<1.79 (s/p arrest) with CrCl ~ 30-35. INR is 2.44<2.45<2.18. No overt bleeding noted. Warfarin was held on 9/3. Noted DDI with amiodarone.   Goal of Therapy:  INR 2-3 Monitor platelets by anticoagulation protocol: Yes   Plan:  -Warfarin 4 mg PO x 1 tonight at 1800 -Daily PT/INR -Monitor for bleeding  Thank you for allowing me to take part in this patient's care,  Abran Duke, PharmD Clinical Pharmacist Phone: [phone] Pager: [phone] 05/12/2013 7:53 AM

## 2013-05-12 NOTE — Progress Notes (Signed)
PULMONARY  / CRITICAL CARE MEDICINE  Name: [name] [name] MRN: [id number] DOB: 07/16/1941    ADMISSION DATE:  05/09/2013 CONSULTATION DATE:  05/10/2013  REFERRING MD :  Halina Andreas Cards PRIMARY SERVICE: [facility]  CHIEF COMPLAINT:  Cardiac arrest  BRIEF PATIENT DESCRIPTION: 72 y/o male with CAD and Aflutter was admitted on 05/09/2013 for management of Aflutter and unfortunately had a VTach arrest on 05/10/2013 requiring CPR.  SIGNIFICANT EVENTS / STUDIES:  9/3 Admission 9/4 VTach Arrest, 5-6 minutes CPR  LINES / TUBES: 9/4 ETT >> 9/05 9/5 CVL >>   CULTURES: BC x 2 9/4>>  ANTIBIOTICS: none  SUBJECTIVE:  tol extubation well but hypoxic   VITAL SIGNS: Temp:  [97.6 F (36.4 C)-98.6 F (37 C)] 98 F (36.7 C) (09/06 0800) Pulse Rate:  [41-95] 63 (09/06 1000) Resp:  [14-29] 19 (09/06 1000) BP: (130-169)/(38-69) 141/38 mmHg (09/06 1000) SpO2:  [83 %-98 %] 91 % (09/06 1000) FiO2 (%):  [100 %] 100 % (09/06 0400) Weight:  [96.6 kg (212 lb 15.4 oz)] 96.6 kg (212 lb 15.4 oz) (09/06 0500)    Oxygen:  Vent Mode:  [-]  FiO2 (%):  [100 %] 100 %NRM INTAKE / OUTPUT: Intake/Output     09/05 0701 - 09/06 0700 09/06 0701 - 09/07 0700   P.O. 120    I.V. (mL/kg) 1175.8 (12.2) 83.4 (0.9)   Total Intake(mL/kg) 1295.8 (13.4) 83.4 (0.9)   Urine (mL/kg/hr) 2165 (0.9)    Total Output 2165     Net -869.2 +83.4          PHYSICAL EXAMINATION:  WGN:FAOZH HEENT: WNL  PULM: few scatteed rhonchi CV: RRR, + systolic M AB: BS+, soft, nontender, no hsm Ext: warm, trace pretibial edema, no clubbing, no cyanosis Neuro: MAEs, + F/C, no focal deficits   LABS:  CBC Recent Labs     05/10/13  2320  05/11/13  0500  05/12/13  0423  WBC  15.5*  14.0*  11.6*  HGB  8.9*  9.1*  8.5*  HCT  29.5*  29.4*  26.6*  PLT  493*  435*  359   Coag's Recent Labs     05/10/13  0200  05/11/13  0500  05/12/13  0423  INR  2.18*  2.45*  2.44*   BMET Recent Labs     05/10/13  2320   05/11/13  0500  05/12/13  0423  NA  131*  130*  133*  K  3.8  4.8  4.1  CL  94*  94*  96  CO2  18*  24  24  BUN  24*  29*  35*  CREATININE  1.79*  2.39*  2.39*  GLUCOSE  340*  275*  134*   Electrolytes Recent Labs     05/09/13  1430   05/10/13  2320  05/11/13  0500  05/12/13  0423  CALCIUM  9.1   < >  8.6  8.9  9.3  MG  2.4   --   2.5   --    --    < > = values in this interval not displayed.   Sepsis Markers No results found for this basename: LACTICACIDVEN, PROCALCITON, O2SATVEN,  in the last 72 hours ABG Recent Labs     05/11/13  0004  05/11/13  0221  05/11/13  0333  PHART  7.253*  7.482*  7.423  PCO2ART  41.5  29.0*  35.2  PO2ART  74.0*  190.0*  133.0*  Liver Enzymes Recent Labs     05/09/13  1430  05/10/13  2320  AST  34  34  ALT  32  33  ALKPHOS  69  95  BILITOT  0.7  0.4  ALBUMIN  3.2*  3.0*   Cardiac Enzymes Recent Labs     05/09/13  1430   05/10/13  2320  05/11/13  0500  05/11/13  1144  TROPONINI  0.39*   < >  <0.30  1.06*  0.89*  PROBNP  2487.0*   --    --    --    --    < > = values in this interval not displayed.   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 D/c IV amio  RENAL A:  AKI CKD Hyponatremia d/t CHF P:   Monitor BMET intermittently Correct electrolytes as indicated   GASTROINTESTINAL A:  No acute issues P:   PO diet  HEMATOLOGIC A:  Warfarin for Aflutter Anemia of uncertain  etiology, no active bleeding P: Continue warfarin per pharm  INFECTIOUS A:  No acute issues P: Monitor off abx  ENDOCRINE A:  Hyperglycemia post code No prior hx of DM documented P:   Monitor CBGs  Begin SSI if glu > 180  NEUROLOGIC A:  Acute encephalopathy post arrest, resolved P:   Minimize sedatives  Code: Full  TODAY'S SUMMARY:   I have personally obtained a history, examined the patient, evaluated laboratory and imaging results, formulated the assessment and plan and placed orders.  CRITICAL CARE: The patient is critically ill with multiple organ systems failure and requires high complexity decision making for assessment and support, frequent evaluation and titration of therapies, application of advanced monitoring technologies and extensive interpretation of multiple databases. Critical Care Time devoted to patient care services described in this note is 30 minutes.   Adella Hare Beeper  [phone]  Cell  [phone]  If no response or cell goes to voicemail, call beeper [phone]  Pulmonary and Critical Care Medicine [facility] Pager: [phone]  05/12/2013, 11:00 AM

## 2013-05-13 ENCOUNTER — Inpatient Hospital Stay (HOSPITAL_COMMUNITY): Payer: Medicare Other

## 2013-05-13 DIAGNOSIS — D509 Iron deficiency anemia, unspecified: Secondary | ICD-10-CM

## 2013-05-13 DIAGNOSIS — D649 Anemia, unspecified: Secondary | ICD-10-CM

## 2013-05-13 LAB — CBC
HCT: 26.5 % — ABNORMAL LOW (ref 39.0–52.0)
Hemoglobin: 8.5 g/dL — ABNORMAL LOW (ref 13.0–17.0)
MCH: 23.9 pg — ABNORMAL LOW (ref 26.0–34.0)
MCV: 74.6 fL — ABNORMAL LOW (ref 78.0–100.0)
RBC: 3.55 MIL/uL — ABNORMAL LOW (ref 4.22–5.81)
WBC: 11.7 10*3/uL — ABNORMAL HIGH (ref 4.0–10.5)

## 2013-05-13 LAB — BASIC METABOLIC PANEL
BUN: 36 mg/dL — ABNORMAL HIGH (ref 6–23)
CO2: 26 mEq/L (ref 19–32)
Chloride: 98 mEq/L (ref 96–112)
Creatinine, Ser: 1.88 mg/dL — ABNORMAL HIGH (ref 0.50–1.35)
GFR calc Af Amer: 40 mL/min — ABNORMAL LOW (ref >90)
Glucose, Bld: 143 mg/dL — ABNORMAL HIGH (ref 70–99)
Potassium: 3.3 mEq/L — ABNORMAL LOW (ref 3.5–5.1)

## 2013-05-13 LAB — RETICULOCYTES
Retic Count, Absolute: 58.8 10*3/uL (ref 19.0–186.0)
Retic Ct Pct: 1.5 % (ref 0.4–3.1)

## 2013-05-13 LAB — GLUCOSE, CAPILLARY
Glucose-Capillary: 161 mg/dL — ABNORMAL HIGH (ref 70–99)
Glucose-Capillary: 207 mg/dL — ABNORMAL HIGH (ref 70–99)

## 2013-05-13 LAB — LACTATE DEHYDROGENASE: LDH: 379 U/L — ABNORMAL HIGH (ref 94–250)

## 2013-05-13 MED ORDER — POTASSIUM CHLORIDE CRYS ER 20 MEQ PO TBCR
40.0000 meq | EXTENDED_RELEASE_TABLET | Freq: Two times a day (BID) | ORAL | Status: DC
  Administered 2013-05-13 (×2): 40 meq via ORAL
  Filled 2013-05-13 (×3): qty 2

## 2013-05-13 MED ORDER — AMIODARONE HCL 200 MG PO TABS
400.0000 mg | ORAL_TABLET | Freq: Three times a day (TID) | ORAL | Status: DC
  Administered 2013-05-13 (×3): 400 mg via ORAL
  Filled 2013-05-13 (×6): qty 2

## 2013-05-13 MED ORDER — BIOTENE DRY MOUTH MT LIQD
15.0000 mL | Freq: Two times a day (BID) | OROMUCOSAL | Status: DC
  Administered 2013-05-14 – 2013-05-17 (×7): 15 mL via OROMUCOSAL

## 2013-05-13 MED ORDER — WARFARIN SODIUM 2.5 MG PO TABS
2.5000 mg | ORAL_TABLET | Freq: Once | ORAL | Status: AC
  Administered 2013-05-13: 2.5 mg via ORAL
  Filled 2013-05-13: qty 1

## 2013-05-13 MED ORDER — PANTOPRAZOLE SODIUM 40 MG PO TBEC
40.0000 mg | DELAYED_RELEASE_TABLET | Freq: Every day | ORAL | Status: DC
  Administered 2013-05-13 – 2013-05-17 (×5): 40 mg via ORAL
  Filled 2013-05-13 (×6): qty 1

## 2013-05-13 MED ORDER — POTASSIUM CHLORIDE 10 MEQ/100ML IV SOLN
10.0000 meq | INTRAVENOUS | Status: AC
  Administered 2013-05-13 (×3): 10 meq via INTRAVENOUS
  Filled 2013-05-13: qty 300

## 2013-05-13 NOTE — Progress Notes (Signed)
Subjective:  Sitting in chair. Better. On [place] o2. No CP.   Objective:  Vital Signs in the last 24 hours: Temp:  [97.7 F (36.5 C)-100 F (37.8 C)] 98 F (36.7 C) (09/07 0921) Pulse Rate:  [59-72] 70 (09/07 0921) Resp:  [17-29] 17 (09/07 0800) BP: (116-150)/(36-67) 136/39 mmHg (09/07 0921) SpO2:  [81 %-98 %] 98 % (09/07 0921) Weight:  [95 kg (209 lb 7 oz)] 95 kg (209 lb 7 oz) (09/07 0451)  Intake/Output from previous day: 09/06 0701 - 09/07 0700 In: 2080.8 [P.O.:1140; I.V.:740.8; IV Piggyback:200] Out: 3525 [Urine:3525]   Physical Exam: General: Well developed, well nourished, in no acute distress. Head:  Normocephalic and atraumatic.[place] O2 Lungs: Clear to auscultation and percussion. No crackles this am. Improved.  Heart: Normal S1 and S2.  No murmur, rubs or gallops.  Abdomen: soft, non-tender, positive bowel sounds. Extremities: No clubbing or cyanosis. No edema. Compression hose. Foley Neurologic: Alert and oriented x 3.    Lab Results:  Recent Labs  05/12/13 0423 05/13/13 0449  WBC 11.6* 11.7*  HGB 8.5* 8.5*  PLT 359 358    Recent Labs  05/12/13 0423 05/13/13 0449  NA 133* 136  K 4.1 3.3*  CL 96 98  CO2 24 26  GLUCOSE 134* 143*  BUN 35* 36*  CREATININE 2.39* 1.88*    Recent Labs  05/11/13 0500 05/11/13 1144  TROPONINI 1.06* 0.89*   Hepatic Function Panel  Recent Labs  05/10/13 2320  PROT 7.4  ALBUMIN 3.0*  AST 34  ALT 33  ALKPHOS 95  BILITOT 0.4    Imaging: Dg Chest Port 1 View  05/13/2013   *RADIOLOGY REPORT*  Clinical Data: Pulmonary edema  PORTABLE CHEST - 1 VIEW  Comparison: 05/11/2013  Findings: Extubation.  Left subclavian line which terminates at the high SVC. Prior median sternotomy.  Cardiomegaly accentuated by AP portable technique.  Similar small layering right pleural effusion. No pneumothorax.  Minimal improvement.  mild to moderate interstitial edema.  Skin fold over the right hemithorax.  Right greater than left bibasilar  airspace disease, similar.  IMPRESSION: Mild improvement in congestive heart failure.  Similar small right pleural effusion with bibasilar airspace disease, likely atelectasis.  Interval extubation.   Original Report Authenticated By: Jeronimo Greaves, M.D.   Personally viewed.   Telemetry: NSR (no VT,. No AFIB)Personally viewed.    Assessment/Plan:  Principal Problem:   Atrial flutter with rapid ventricular response Active Problems:   Prosthetic valve dysfunction   Acute diastolic heart failure   Hemolytic anemia, acute   Cardiac arrest   V-tach   Acute respiratory failure with hypoxia   Pulmonary edema   Hyponatremia  72 year old with following issues:   1) V fib arrest/ cardiac arrest  - currently stable on IV amio  - change to PO 400mg  TID - Brief CPR/ defib x 1 on 05/10/13  - no signs of encephalopathy   2) Decompensated diastolic HF  - trying to diurese as renal function allows.  - IV lasix 80 Q6 continue today - creat down from 2.39 to 1.88 - Prior EF 65%  - take foley out  3) AFLUTTER  - awaiting ablation next week (possible Tuesday)  - prior 1:1 conduction >>>VTach  - anticoagulation INR 2.5  4) CAD  - CABG   5) Aortic valve replacement  - bioprosthetic   6) HTN  - amlodipine stable   7) Acute hypoxemic respiratory failure post arrest  Pulmonary edema  Improved with diuresis  Wean O2 for SpO2 > 92%  Appreciate CCM care.   8) Hypokalemia    - replete K.   Transfer to step down.   SKAINS, MARK 05/13/2013, 10:04 AM

## 2013-05-13 NOTE — Progress Notes (Signed)
ANTICOAGULATION CONSULT NOTE - Follow Up Consult  Pharmacy Consult for Warfarin  Indication: atrial fibrillation  No Known Allergies  Patient Measurements: Height: 5\' 11"  (180.3 cm) Weight: 209 lb 7 oz (95 kg) IBW/kg (Calculated) : 75.3  Vital Signs: Temp: 98.8 F (37.1 C) (09/07 0400) Temp src: Oral (09/07 0400) BP: 135/43 mmHg (09/07 0700) Pulse Rate: 64 (09/07 0700)  Labs:  Recent Labs  05/10/13 2320 05/11/13 0500 05/11/13 1144 05/12/13 0423 05/13/13 0449  HGB 8.9* 9.1*  --  8.5* 8.5*  HCT 29.5* 29.4*  --  26.6* 26.5*  PLT 493* 435*  --  359 358  LABPROT  --  25.8*  --  25.7* 26.2*  INR  --  2.45*  --  2.44* 2.50*  CREATININE 1.79* 2.39*  --  2.39* 1.88*  TROPONINI <0.30 1.06* 0.89*  --   --     Estimated Creatinine Clearance: 43 ml/min (by C-G formula based on Cr of 1.88).  Assessment: 73 y/o M with afib on chronic warfarin therapy. Pt s/p CODE BLUE 9/4 for VT requiring defibrillation. Plan is for ablation when more stable (early next week). Dr. Johney Frame wants to ensure INR remains 2-3 over the weekend in anticipation. Hgb 8.5 x 2 days, Plts 358, SCr 1.88<2.39<2.39<1.79 (s/p arrest) with CrCl ~ 40. INR is 2.5<2.44<2.45<2.18. No overt bleeding noted. Warfarin was held on 9/3. Noted DDI with amiodarone.   Goal of Therapy:  INR 2-3 Monitor platelets by anticoagulation protocol: Yes   Plan:  -Warfarin 2.5 mg PO x 1 tonight at 1800 (home dose) -Daily PT/INR -Monitor for bleeding  Thank you for allowing me to take part in this patient's care,  Abran Duke, PharmD Clinical Pharmacist Phone: [phone] Pager: [phone] 05/13/2013 8:10 AM

## 2013-05-13 NOTE — Consult Note (Signed)
Hematology consult noted. Likely multifactorial in nature due to low iron, renal disease and rule out hemolysis, MDS.  Labs ordered and full consult note to follow once labs are back.  This is unlikely due to hemolysis.

## 2013-05-13 NOTE — Progress Notes (Signed)
PULMONARY  / CRITICAL CARE MEDICINE  Name: [name] [name] MRN: [id number] DOB: 09-10-1940    ADMISSION DATE:  05/09/2013 CONSULTATION DATE:  05/10/2013  REFERRING MD :  Halina Andreas Cards PRIMARY SERVICE: [facility]  CHIEF COMPLAINT:  Cardiac arrest  BRIEF PATIENT DESCRIPTION: 72 y/o male with CAD and Aflutter was admitted on 05/09/2013 for management of Aflutter and unfortunately had a VTach arrest on 05/10/2013 requiring CPR.  SIGNIFICANT EVENTS / STUDIES:  9/3 Admission 9/4 VTach Arrest, 5-6 minutes CPR  LINES / TUBES: 9/4 ETT >> 9/05 9/5 CVL >>   CULTURES: BC x 2 9/4>>  ANTIBIOTICS: none  SUBJECTIVE:  Improved with diuresis, on less oxygen   VITAL SIGNS: Temp:  [97.7 F (36.5 C)-100 F (37.8 C)] 98 F (36.7 C) (09/07 0921) Pulse Rate:  [59-72] 70 (09/07 0921) Resp:  [17-29] 17 (09/07 0800) BP: (116-150)/(36-67) 136/39 mmHg (09/07 0921) SpO2:  [81 %-98 %] 98 % (09/07 0921) Weight:  [95 kg (209 lb 7 oz)] 95 kg (209 lb 7 oz) (09/07 0451)    Oxygen:    6L [place] INTAKE / OUTPUT: Intake/Output     09/06 0701 - 09/07 0700 09/07 0701 - 09/08 0700   P.O. 1140    I.V. (mL/kg) 740.8 (7.8) 43.4 (0.5)   IV Piggyback 200 100   Total Intake(mL/kg) 2080.8 (21.9) 143.4 (1.5)   Urine (mL/kg/hr) 3525 (1.5)    Total Output 3525     Net -1444.2 +143.4          PHYSICAL EXAMINATION:  BMW:UXLKG HEENT: WNL  PULM: improved BS, but decreased still in bibasilar areas CV: RRR, + systolic M AB: BS+, soft, nontender, no hsm Ext: warm, trace pretibial edema, no clubbing, no cyanosis Neuro: MAEs, + F/C, no focal deficits   LABS:  CBC Recent Labs     05/11/13  0500  05/12/13  0423  05/13/13  0449  WBC  14.0*  11.6*  11.7*  HGB  9.1*  8.5*  8.5*  HCT  29.4*  26.6*  26.5*  PLT  435*  359  358   Coag's Recent Labs     05/11/13  0500  05/12/13  0423  05/13/13  0449  INR  2.45*  2.44*  2.50*   BMET Recent Labs     05/11/13  0500  05/12/13  0423  05/13/13   0449  NA  130*  133*  136  K  4.8  4.1  3.3*  CL  94*  96  98  CO2  24  24  26   BUN  29*  35*  36*  CREATININE  2.39*  2.39*  1.88*  GLUCOSE  275*  134*  143*   Electrolytes Recent Labs     05/10/13  2320  05/11/13  0500  05/12/13  0423  05/13/13  0449  CALCIUM  8.6  8.9  9.3  8.8  MG  2.5   --    --    --    Sepsis Markers No results found for this basename: LACTICACIDVEN, PROCALCITON, O2SATVEN,  in the last 72 hours ABG Recent Labs     05/11/13  0221  05/11/13  0333  05/12/13  1653  PHART  7.482*  7.423  7.489*  PCO2ART  29.0*  35.2  32.7*  PO2ART  190.0*  133.0*  50.0*   Liver Enzymes Recent Labs     05/10/13  2320  AST  34  ALT  33  ALKPHOS  95  BILITOT  0.4  ALBUMIN  3.0*   Cardiac Enzymes Recent Labs     05/10/13  2320  05/11/13  0500  05/11/13  1144  TROPONINI  <0.30  1.06*  0.89*   Glucose Recent Labs     05/11/13  2112  05/12/13  0745  05/12/13  1138  05/12/13  1703  05/12/13  2152  05/13/13  0818  GLUCAP  140*  141*  139*  148*  140*  163*    Imaging   CXR: CM, less edema, bibasilar ATX  ASSESSMENT / PLAN:  PULMONARY A: Acute hypoxemic respiratory failure post arrest>>REsolved Pulmonary edema improved with diuresis Basilar ATX P:   Cont  diuresis Wean O2 for SpO2 > 92% Incentive spirometry  CARDIOVASCULAR A:  Atrial flutter  S/P VT arrest 9/04 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 CAD Hypertension P:  Mgmt per Cards Ablation planned early next week On amiodarone infusion   RENAL A:  AKI CKD Hyponatremia d/t CHF P:   Monitor BMET intermittently Correct electrolytes as indicated   GASTROINTESTINAL A:  No acute issues P:   PO diet  HEMATOLOGIC A:  Warfarin for Aflutter Anemia of uncertain etiology, no active bleeding P: Continue warfarin per pharm  INFECTIOUS A:  No acute issues P: Monitor off abx  ENDOCRINE A:  Hyperglycemia post code No prior hx of DM documented P:    Monitor CBGs  Begin SSI if glu > 180  NEUROLOGIC A:  Acute encephalopathy post arrest, resolved P:   Minimize sedatives  Code: Full  Ok with tfr to SDU, PCCM will SIGN OFF. Call again PRN.   Adella Hare Beeper  [phone]  Cell  [phone]  If no response or cell goes to voicemail, call beeper [phone]  Pulmonary and Critical Care Medicine [facility] Pager: [phone]  05/13/2013, 10:21 AM

## 2013-05-13 NOTE — Consult Note (Signed)
Reason for Referral: Anemia.  HPI: The patient is 72 year old man currently of [place]. He has a history of coronary artery disease with coronary bypass surgery in 2010, aortic valve replacement 2000 to have aortic stenosis (bioprosthesis), and postoperative atrial arrhythmias. Patient was admitted on 9/3 with complaints of weakness and increased sob.  He also has had weight loss over the past several months> He was also noted to be anemia with a hemoglobin as low as 8.1 diagnosed in August by his primary physician Dr. Kirby Funk. It appears that the anemia is iron deficiency. Iron therapy was started by Dr. Valentina Lucks. No repeat studies have been done since mid-August. Patient  had a VTach arrest on 05/10/2013 requiring CPR and developed respiratory failure that has resolved now.   I was asked to evaluate the patient for his anemia and rule out hemolysis.   Past Medical History  Diagnosis Date  . Coronary artery disease   . Angina   . Hypertension   . Atrial flutter   . Obstructive sleep apnea     severe  :  Past Surgical History  Procedure Laterality Date  . Coronary artery bypass graft    . Cardiac valve replacement      aortic valve replacement (bioprosthetic)  . Tee without cardioversion  11/30/2011    Procedure: TRANSESOPHAGEAL ECHOCARDIOGRAM (TEE);  Surgeon: Quintella Reichert, MD;  Location: [facility] ENDOSCOPY;  Service: Cardiovascular;  Laterality: N/A;  :  Current Facility-Administered Medications  Medication Dose Route Frequency Provider Last Rate Last Dose  . 0.9 %  sodium chloride infusion  250 mL Intravenous PRN Lyn Records III, MD 20 mL/hr at 05/11/13 1900 250 mL at 05/11/13 1900  . 0.9 %  sodium chloride infusion   Intravenous Continuous Lyn Records III, MD 5 mL/hr at 05/11/13 1900    . acetaminophen (TYLENOL) tablet 650 mg  650 mg Oral Q4H PRN Lyn Records III, MD      . albuterol (PROVENTIL HFA;VENTOLIN HFA) 108 (90 BASE) MCG/ACT inhaler 6-8 puff  6-8 puff Inhalation Q2H  PRN Lupita Leash, MD      . amiodarone (PACERONE) tablet 400 mg  400 mg Oral TID Donato Schultz, MD   400 mg at 05/13/13 1708  . amLODipine (NORVASC) tablet 5 mg  5 mg Oral Daily Lyn Records III, MD   5 mg at 05/13/13 1051  . antiseptic oral rinse (BIOTENE) solution 15 mL  15 mL Mouth Rinse QID Lupita Leash, MD   15 mL at 05/13/13 0400  . atorvastatin (LIPITOR) tablet 10 mg  10 mg Oral q1800 Lyn Records III, MD   10 mg at 05/13/13 1707  . fentaNYL (SUBLIMAZE) injection 25-50 mcg  25-50 mcg Intravenous Q2H PRN Lupita Leash, MD      . insulin aspart (novoLOG) injection 0-15 Units  0-15 Units Subcutaneous TID WC Lesleigh Noe, MD   5 Units at 05/13/13 1708  . levalbuterol (XOPENEX) nebulizer solution 0.63 mg  0.63 mg Nebulization Q3H PRN Merwyn Katos, MD      . omega-3 acid ethyl esters (LOVAZA) capsule 1 g  1 g Oral TID Lyn Records III, MD   1 g at 05/13/13 1707  . ondansetron (ZOFRAN) injection 4 mg  4 mg Intravenous Q6H PRN Lyn Records III, MD      . pantoprazole (PROTONIX) EC tablet 40 mg  40 mg Oral Daily Lesleigh Noe, MD   40  mg at 05/13/13 1224  . potassium chloride SA (K-DUR,KLOR-CON) CR tablet 40 mEq  40 mEq Oral BID Donato Schultz, MD   40 mEq at 05/13/13 1051  . sodium chloride 0.9 % injection 3 mL  3 mL Intravenous Q12H Lyn Records III, MD   3 mL at 05/12/13 2142  . sodium chloride 0.9 % injection 3 mL  3 mL Intravenous PRN Lesleigh Noe, MD      . Warfarin - Pharmacist Dosing Inpatient   Does not apply q1800 Severiano Gilbert, [facility]           No Known Allergies:  History reviewed. No pertinent family history.:  History   Social History  . Marital Status: Married    Spouse Name: N/A    Number of Children: N/A  . Years of Education: N/A   Occupational History  . Not on file.   Social History Main Topics  . Smoking status: Current Every Day Smoker -- 0.25 packs/day for 10 years    Types: Cigarettes  . Smokeless tobacco: Former Neurosurgeon     Quit date: 11/29/1978  . Alcohol Use: 1.2 oz/week    2 Glasses of wine per week  . Drug Use: No  . Sexual Activity: Yes   Other Topics Concern  . Not on file   Social History Narrative  . No narrative on file  :  Pertinent items are noted in HPI.  Exam: ECOG 13 Blood pressure 130/40, pulse 66, temperature 98.1 F (36.7 C), temperature source Oral, resp. rate 17, height 5\' 11"  (1.803 m), weight 209 lb 7 oz (95 kg), SpO2 100.00%. General appearance: alert, cooperative and appears stated age Head: Normocephalic, without obvious abnormality, atraumatic Neck: no adenopathy, no carotid bruit, no JVD, supple, symmetrical, trachea midline and thyroid not enlarged, symmetric, no tenderness/mass/nodules Back: negative Resp: clear to auscultation bilaterally Cardio: regular rate and rhythm, S1, S2 normal, no murmur, click, rub or gallop GI: soft, non-tender; bowel sounds normal; no masses,  no organomegaly Extremities: extremities normal, atraumatic, no cyanosis or edema Pulses: 2+ and symmetric   Recent Labs  05/12/13 0423 05/13/13 0449  WBC 11.6* 11.7*  HGB 8.5* 8.5*  HCT 26.6* 26.5*  PLT 359 358    Recent Labs  05/12/13 0423 05/13/13 0449  NA 133* 136  K 4.1 3.3*  CL 96 98  CO2 24 26  GLUCOSE 134* 143*  BUN 35* 36*  CREATININE 2.39* 1.88*  CALCIUM 9.3 8.8     Blood smear review: I personally reviewed his peripheral smear and there is no evidence to suggest schistocytes. There is clear evidence of hypochromia and microcytosis. White cells and platelets appeared within normal range.     Assessment and Plan:   29 -year-old with a microcytic hypochromic anemia with a hemoglobin that have ranged between 8 and 9 since his recent admission on 05/09/2013. He was also noted there to be anemic under the care of his primary care physician and was given iron supplements. His hemoglobin March of 2013 was 14. His MCV have ranged between 74 and 76. He has slight leukocytosis but  normal platelets. His haptoglobin is 214 with low normal reticulocyte count which rules out hemolysis. His LDH mildly elevated probably due to cardiovascular disease and possible muscle injury from his cardiac contrast.  The differential diagnosis for his anemia includes multifactorial in nature. He is probably an element of iron deficiency, anemia of renal disease and possibly anemia of chronic disease. I doubt he  has an element of a malignancy or a myelodysplastic syndrome. I doubt this is a sign of a hematological disorder or leukemia.  Her management standpoint, I will await his iron studies which are pending probably place him on iron supplements and if needed to he could be transfused with packed red cell transfusion to boost his hemoglobin close to 10 to aid in his cardio vascular status.  It would be reasonable for this gentleman to be evaluated as an outpatient with hematology if his hemoglobin does not correct with iron replacements down the line.

## 2013-05-14 LAB — CBC
Hemoglobin: 8.9 g/dL — ABNORMAL LOW (ref 13.0–17.0)
MCH: 23.5 pg — ABNORMAL LOW (ref 26.0–34.0)
MCHC: 31.1 g/dL (ref 30.0–36.0)
Platelets: 362 10*3/uL (ref 150–400)

## 2013-05-14 LAB — VITAMIN B12: Vitamin B-12: 867 pg/mL (ref 211–911)

## 2013-05-14 LAB — IRON AND TIBC
Iron: 12 ug/dL — ABNORMAL LOW (ref 42–135)
TIBC: 349 ug/dL (ref 215–435)

## 2013-05-14 LAB — BASIC METABOLIC PANEL
CO2: 24 mEq/L (ref 19–32)
Calcium: 9.1 mg/dL (ref 8.4–10.5)
Chloride: 96 mEq/L (ref 96–112)
Creatinine, Ser: 1.69 mg/dL — ABNORMAL HIGH (ref 0.50–1.35)
GFR calc Af Amer: 46 mL/min — ABNORMAL LOW (ref >90)
Sodium: 133 mEq/L — ABNORMAL LOW (ref 135–145)

## 2013-05-14 LAB — GLUCOSE, CAPILLARY
Glucose-Capillary: 148 mg/dL — ABNORMAL HIGH (ref 70–99)
Glucose-Capillary: 126 mg/dL — ABNORMAL HIGH (ref 70–99)
Glucose-Capillary: 133 mg/dL — ABNORMAL HIGH (ref 70–99)

## 2013-05-14 LAB — PROTIME-INR
INR: 2.59 — ABNORMAL HIGH (ref 0.00–1.49)
Prothrombin Time: 26.9 seconds — ABNORMAL HIGH (ref 11.6–15.2)

## 2013-05-14 LAB — FERRITIN: Ferritin: 109 ng/mL (ref 22–322)

## 2013-05-14 MED ORDER — FUROSEMIDE 10 MG/ML IJ SOLN
INTRAMUSCULAR | Status: AC
  Filled 2013-05-14: qty 4

## 2013-05-14 MED ORDER — WARFARIN SODIUM 2.5 MG PO TABS
2.5000 mg | ORAL_TABLET | Freq: Once | ORAL | Status: AC
  Administered 2013-05-14: 2.5 mg via ORAL
  Filled 2013-05-14: qty 1

## 2013-05-14 MED ORDER — FERROUS SULFATE 325 (65 FE) MG PO TABS
325.0000 mg | ORAL_TABLET | Freq: Three times a day (TID) | ORAL | Status: DC
  Administered 2013-05-14 – 2013-05-17 (×11): 325 mg via ORAL
  Filled 2013-05-14 (×17): qty 1

## 2013-05-14 MED ORDER — FUROSEMIDE 10 MG/ML IJ SOLN
40.0000 mg | Freq: Once | INTRAMUSCULAR | Status: AC
  Administered 2013-05-14: 40 mg via INTRAVENOUS

## 2013-05-14 MED ORDER — AMIODARONE HCL 200 MG PO TABS
200.0000 mg | ORAL_TABLET | Freq: Three times a day (TID) | ORAL | Status: DC
  Administered 2013-05-14 – 2013-05-15 (×4): 200 mg via ORAL
  Filled 2013-05-14 (×6): qty 1

## 2013-05-14 NOTE — Progress Notes (Addendum)
Patient Name: [name] [name] Date of Encounter: 05/14/2013    SUBJECTIVE:  He is doing much better and there are no complaints of dyspnea or chest pain. I really appreciate the Hematology consult by Dr. Clelia Croft. He is awaiting ablation in AM.  TELEMETRY:  NSR: Filed Vitals:   05/14/13 0324 05/14/13 0400 05/14/13 0444 05/14/13 0620  BP:  143/44    Pulse:      Temp: 99.2 F (37.3 C)     TempSrc: Oral     Resp: 18     Height:      Weight:   96.3 kg (212 lb 4.9 oz)   SpO2: 90% 100%  95%    Intake/Output Summary (Last 24 hours) at 05/14/13 0454 Last data filed at 05/14/13 0500  Gross per 24 hour  Intake 1126.1 ml  Output   1251 ml  Net -124.9 ml    LABS: Basic Metabolic Panel:  Recent Labs  09/81/19 0449 05/14/13 0354  NA 136 133*  K 3.3* 4.1  CL 98 96  CO2 26 24  GLUCOSE 143* 144*  BUN 36* 42*  CREATININE 1.88* 1.69*  CALCIUM 8.8 9.1   CBC:  Recent Labs  05/13/13 0449 05/14/13 0354  WBC 11.7* 10.1  HGB 8.5* 8.9*  HCT 26.5* 28.6*  MCV 74.6* 75.5*  PLT 358 362   Cardiac Enzymes:  Recent Labs  05/11/13 1144  TROPONINI 0.89*   I/O: - 4200 cc since admission Radiology/Studies:  No new  Physical Exam: Blood pressure 143/44, pulse 66, temperature 99.2 F (37.3 C), temperature source Oral, resp. rate 18, height 5\' 11"  (1.803 m), weight 96.3 kg (212 lb 4.9 oz), SpO2 95.00%. Weight change: 1.3 kg (2 lb 13.9 oz)   2/6 decrescendo murmur of AR  Decreased breath sounds bilaterally.  No edema  Neuro intact but affect is detached.  ASSESSMENT:  1. Acute on chronic diastolic heart failure much improved with diuresis and conversion of flutter to NSR. 2. Acute on chronic kidney injury, improving with diuresis and correction of arrhythmia. 3. Aortic regurgitation due to paravalvular leak, stable 4. Anemia, likely multifactorial, and unlikely due to hemolysis.  Plan:  1. Decrease diuresis to avoid volume contraction 2. Start iron 3. Ablation of atrial  flutter focus tomorrow. 4. Echocardiogram today  Signed, Lesleigh Noe 05/14/2013, 7:12 AM

## 2013-05-14 NOTE — Progress Notes (Signed)
SUBJECTIVE: The patient is doing well today.  At this time, he denies chest pain, shortness of breath, or any new concerns.  Some hypoxia last night, tx with O2 and Lasix.   Hematology consult yesterday for anemia with iron studies pending.   Maintaining SR on Amidoarone.  Tentative plan for atrial flutter ablation tomorrow.   CURRENT MEDICATIONS: . amiodarone  400 mg Oral TID  . amLODipine  5 mg Oral Daily  . antiseptic oral rinse  15 mL Mouth Rinse BID  . atorvastatin  10 mg Oral q1800  . insulin aspart  0-15 Units Subcutaneous TID WC  . omega-3 acid ethyl esters  1 g Oral TID  . pantoprazole  40 mg Oral Daily  . potassium chloride  40 mEq Oral BID  . sodium chloride  3 mL Intravenous Q12H  . Warfarin - Pharmacist Dosing Inpatient   Does not apply q1800   . sodium chloride 5 mL/hr at 05/11/13 1900    OBJECTIVE: Physical Exam: Filed Vitals:   05/13/13 2328 05/14/13 0324 05/14/13 0400 05/14/13 0444  BP: 132/38  143/44   Pulse:      Temp: 99.8 F (37.7 C) 99.2 F (37.3 C)    TempSrc: Oral Oral    Resp: 16 18    Height:      Weight:    212 lb 4.9 oz (96.3 kg)  SpO2: 93% 90% 100%     Intake/Output Summary (Last 24 hours) at 05/14/13 0612 Last data filed at 05/14/13 0500  Gross per 24 hour  Intake 1247.8 ml  Output   1401 ml  Net -153.2 ml    Telemetry reveals sinus rhythm, prolonged QT  GEN- The patient is well appearing, alert and oriented x 3 today.   Head- normocephalic, atraumatic Eyes-  Sclera clear, conjunctiva pink Ears- hearing intact Oropharynx- clear Neck- supple, no JVP Lymph- no cervical lymphadenopathy Lungs- Clear to ausculation bilaterally, normal work of breathing Heart- Regular rate and rhythm  GI- soft, NT, ND, + BS Extremities- no clubbing, cyanosis, or edema  LABS: Basic Metabolic Panel:  Recent Labs  07/02/24 0449 05/14/13 0354  NA 136 133*  K 3.3* 4.1  CL 98 96  CO2 26 24  GLUCOSE 143* 144*  BUN 36* 42*  CREATININE  1.88* 1.69*  CALCIUM 8.8 9.1   CBC:  Recent Labs  05/13/13 0449 05/14/13 0354  WBC 11.7* 10.1  HGB 8.5* 8.9*  HCT 26.5* 28.6*  MCV 74.6* 75.5*  PLT 358 362   Cardiac Enzymes:  Recent Labs  05/11/13 1144  TROPONINI 0.89*   Anemia Panel:  Recent Labs  05/13/13 1410  RETICCTPCT 1.5    RADIOLOGY: X-ray Chest Pa And Lateral 05/09/2013   *RADIOLOGY REPORT*  Clinical Data: Congestive failure  CHEST - 2 VIEW  Comparison: 11/17/2012  Findings: Cardiac shadow is mildly enlarged.  Postsurgical changes are again seen.  Blunting of the costophrenic angles is noted bilaterally new from prior exam.  Scattered calcified granulomas are seen.  No focal confluent infiltrate is seen.  IMPRESSION: Small bilateral pleural effusions with mild bibasilar atelectasis. No focal confluent infiltrate is seen.   Original Report Authenticated By: Alcide Clever, M.D.   Dg Chest Port 1 View 05/13/2013   *RADIOLOGY REPORT*  Clinical Data: Pulmonary edema  PORTABLE CHEST - 1 VIEW  Comparison: 05/11/2013  Findings: Extubation.  Left subclavian line which terminates at the high SVC. Prior median sternotomy.  Cardiomegaly accentuated by AP portable technique.  Similar small layering right  pleural effusion. No pneumothorax.  Minimal improvement.  mild to moderate interstitial edema.  Skin fold over the right hemithorax.  Right greater than left bibasilar airspace disease, similar.  IMPRESSION: Mild improvement in congestive heart failure.  Similar small right pleural effusion with bibasilar airspace disease, likely atelectasis.  Interval extubation.   Original Report Authenticated By: Jeronimo Greaves, M.D.   ASSESSMENT AND PLAN:  Principal Problem:   Atrial flutter with rapid ventricular response Active Problems:   Prosthetic valve dysfunction   Acute diastolic heart failure   Hemolytic anemia, acute   Cardiac arrest   V-tach   Acute respiratory failure with hypoxia   Pulmonary edema   Hyponatremia  1. Polymorphic VT   Due to prolonged 1:1 atrial flutter with V rates 180s.  I think that the appropriate intervention will be ablation of his atrial flutter  2. Recurrent atrial flutter  Therapeutic strategies for atrial flutter including medicine and ablation have been discussed in detail with the patient. Risk, benefits, and alternatives to EP study and radiofrequency ablation were also discussed in detail today. These risks include but are not limited to stroke, bleeding, vascular damage, tamponade, perforation, damage to the heart and other structures, AV block requiring pacemaker, worsening renal function, and death. The patient understands these risk and wishes to proceed.  We will therefore proceed with catheter ablation tomorrow am with anesthesia assistance.  3. Acute on chronic diastolic HF  contnue gentle diuresis   4. OSA - recently diagnosed; untreated currently   5. Anemia - workup is ongoing

## 2013-05-14 NOTE — Progress Notes (Signed)
Echocardiogram 2D Echocardiogram has been performed.  [name] [name] 05/14/2013, 11:27 AM

## 2013-05-14 NOTE — Progress Notes (Signed)
Pt had no stools throughtout shift until 1700 when she had a moderate amount of bloody stool and then a large amount of bloody stool at 1900

## 2013-05-14 NOTE — Progress Notes (Signed)
Labs reviewed from today. Iron levels are low indication an element of iron deficiency anemia.  I would recommend oral iron supplement (325 mg twice a day) upon discharge.  Please call with questions.

## 2013-05-14 NOTE — Significant Event (Signed)
Pt noted to have dyspnea, hypoxia, and crackles on RN bedside respiratory exam.  Will give lasix 40 mg IV x one and monitor.  Coralyn Helling, MD 05/14/2013, 2:19 AM

## 2013-05-14 NOTE — Progress Notes (Signed)
ANTICOAGULATION CONSULT NOTE - Follow Up Consult  Pharmacy Consult for Coumadin Indication: atrial fibrillation  No Known Allergies  Patient Measurements: Height: 5\' 11"  (180.3 cm) Weight: 212 lb 4.9 oz (96.3 kg) IBW/kg (Calculated) : 75.3 Heparin Dosing Weight:   Vital Signs: Temp: 98.9 F (37.2 C) (09/08 0845) Temp src: Oral (09/08 0845) BP: 121/35 mmHg (09/08 0845)  Labs:  Recent Labs  05/11/13 1144  05/12/13 0423 05/13/13 0449 05/14/13 0354  HGB  --   < > 8.5* 8.5* 8.9*  HCT  --   --  26.6* 26.5* 28.6*  PLT  --   --  359 358 362  LABPROT  --   --  25.7* 26.2* 26.9*  INR  --   --  2.44* 2.50* 2.59*  CREATININE  --   --  2.39* 1.88* 1.69*  TROPONINI 0.89*  --   --   --   --   < > = values in this interval not displayed.  Estimated Creatinine Clearance: 48.2 ml/min (by C-G formula based on Cr of 1.69).   Assessment::afib/flutter with rvr  Anticoagulation:hx afib, INR 2.59. CODE BLUE on 9/4>>Dr. Allred with plans for ablation possibly Tuesday 9/9. Hgb 8.9 stable. --PTA warfarin dose: 5mg  MF, 2.5mg  all other days  Goal of Therapy:  INR 2-3 Monitor platelets by anticoagulation protocol: Yes   Plan:  Coumadin 2.5mg  po x 1 today Ablation 9/9   .Morrissa Shein S. Merilynn Finland, PharmD, BCPS Clinical Staff Pharmacist Pager [phone]  Misty Stanley Stillinger 05/14/2013,10:12 AM

## 2013-05-14 NOTE — Progress Notes (Signed)
Attempts to contact [facility] were unsuccessful. Pt noted to have crackles, SOB, decrease Sats. Pt placed on non-rebreather. Elink contacted. Sood, MD to place order for Lasix will continue to monitor.

## 2013-05-15 ENCOUNTER — Inpatient Hospital Stay (HOSPITAL_COMMUNITY): Payer: Medicare Other

## 2013-05-15 ENCOUNTER — Inpatient Hospital Stay (HOSPITAL_COMMUNITY): Payer: Medicare Other | Admitting: Anesthesiology

## 2013-05-15 ENCOUNTER — Encounter (HOSPITAL_COMMUNITY)
Admission: AD | Disposition: A | Discharge status: Home / Self Care | Payer: Medicare Other | Source: Ambulatory Visit | Attending: Interventional Cardiology

## 2013-05-15 ENCOUNTER — Encounter (HOSPITAL_COMMUNITY): Payer: Self-pay | Admitting: Anesthesiology

## 2013-05-15 DIAGNOSIS — I4892 Unspecified atrial flutter: Secondary | ICD-10-CM

## 2013-05-15 HISTORY — PX: ATRIAL FLUTTER ABLATION: SHX5733

## 2013-05-15 LAB — BASIC METABOLIC PANEL
BUN: 35 mg/dL — ABNORMAL HIGH (ref 6–23)
CO2: 25 mEq/L (ref 19–32)
Chloride: 98 mEq/L (ref 96–112)
Creatinine, Ser: 1.34 mg/dL (ref 0.50–1.35)
GFR calc Af Amer: 60 mL/min — ABNORMAL LOW (ref >90)
GFR calc non Af Amer: 52 mL/min — ABNORMAL LOW (ref >90)
Potassium: 3.5 mEq/L (ref 3.5–5.1)
Sodium: 134 mEq/L — ABNORMAL LOW (ref 135–145)

## 2013-05-15 LAB — PROTIME-INR
INR: 2.47 — ABNORMAL HIGH (ref 0.00–1.49)
Prothrombin Time: 25.9 seconds — ABNORMAL HIGH (ref 11.6–15.2)

## 2013-05-15 LAB — CULTURE, BLOOD (ROUTINE X 2): Culture: NO GROWTH

## 2013-05-15 LAB — CBC
Hemoglobin: 8.1 g/dL — ABNORMAL LOW (ref 13.0–17.0)
MCHC: 31.8 g/dL (ref 30.0–36.0)
RBC: 3.39 MIL/uL — ABNORMAL LOW (ref 4.22–5.81)
WBC: 8.4 10*3/uL (ref 4.0–10.5)

## 2013-05-15 LAB — GLUCOSE, CAPILLARY: Glucose-Capillary: 110 mg/dL — ABNORMAL HIGH (ref 70–99)

## 2013-05-15 SURGERY — ATRIAL FLUTTER ABLATION
Anesthesia: General

## 2013-05-15 MED ORDER — ONDANSETRON HCL 4 MG/2ML IJ SOLN
INTRAMUSCULAR | Status: DC | PRN
  Administered 2013-05-15: 4 mg via INTRAVENOUS

## 2013-05-15 MED ORDER — FENTANYL CITRATE 0.05 MG/ML IJ SOLN
INTRAMUSCULAR | Status: DC | PRN
  Administered 2013-05-15 (×2): 50 ug via INTRAVENOUS

## 2013-05-15 MED ORDER — HYDROCODONE-ACETAMINOPHEN 5-325 MG PO TABS: 1.0000 | ORAL_TABLET | ORAL | Status: DC | PRN

## 2013-05-15 MED ORDER — SODIUM CHLORIDE 0.9 % IJ SOLN: 3.0000 mL | INTRAMUSCULAR | Status: DC | PRN

## 2013-05-15 MED ORDER — PROPOFOL 10 MG/ML IV BOLUS
INTRAVENOUS | Status: DC | PRN
  Administered 2013-05-15 (×2): 100 mg via INTRAVENOUS

## 2013-05-15 MED ORDER — FUROSEMIDE 10 MG/ML IJ SOLN
80.0000 mg | Freq: Three times a day (TID) | INTRAMUSCULAR | Status: DC
  Administered 2013-05-15: 20 mg via INTRAVENOUS
  Administered 2013-05-15 – 2013-05-16 (×2): 80 mg via INTRAVENOUS
  Filled 2013-05-15 (×5): qty 8

## 2013-05-15 MED ORDER — SUCCINYLCHOLINE CHLORIDE 20 MG/ML IJ SOLN
INTRAMUSCULAR | Status: DC | PRN
  Administered 2013-05-15: 60 mg via INTRAVENOUS

## 2013-05-15 MED ORDER — ACETAMINOPHEN 325 MG PO TABS: 650.0000 mg | ORAL_TABLET | ORAL | Status: DC | PRN

## 2013-05-15 MED ORDER — SODIUM CHLORIDE 0.9 % IJ SOLN
3.0000 mL | Freq: Two times a day (BID) | INTRAMUSCULAR | Status: DC
  Administered 2013-05-15 – 2013-05-17 (×4): 3 mL via INTRAVENOUS

## 2013-05-15 MED ORDER — ROCURONIUM BROMIDE 100 MG/10ML IV SOLN
INTRAVENOUS | Status: DC | PRN
  Administered 2013-05-15: 20 mg via INTRAVENOUS

## 2013-05-15 MED ORDER — BUPIVACAINE HCL (PF) 0.25 % IJ SOLN
INTRAMUSCULAR | Status: AC
  Filled 2013-05-15: qty 30

## 2013-05-15 MED ORDER — NEOSTIGMINE METHYLSULFATE 1 MG/ML IJ SOLN
INTRAMUSCULAR | Status: DC | PRN
  Administered 2013-05-15: 3 mg via INTRAVENOUS

## 2013-05-15 MED ORDER — FENTANYL CITRATE 0.05 MG/ML IJ SOLN: 25.0000 ug | INTRAMUSCULAR | Status: DC | PRN

## 2013-05-15 MED ORDER — GLYCOPYRROLATE 0.2 MG/ML IJ SOLN
INTRAMUSCULAR | Status: DC | PRN
  Administered 2013-05-15: 0.4 mg via INTRAVENOUS

## 2013-05-15 MED ORDER — LIDOCAINE HCL (CARDIAC) 20 MG/ML IV SOLN
INTRAVENOUS | Status: DC | PRN
  Administered 2013-05-15: 30 mg via INTRAVENOUS

## 2013-05-15 MED ORDER — FUROSEMIDE 10 MG/ML IJ SOLN
INTRAMUSCULAR | Status: AC
  Filled 2013-05-15: qty 8

## 2013-05-15 MED ORDER — ONDANSETRON HCL 4 MG/2ML IJ SOLN: 4.0000 mg | Freq: Four times a day (QID) | INTRAMUSCULAR | Status: DC | PRN

## 2013-05-15 MED ORDER — WARFARIN SODIUM 2.5 MG PO TABS
2.5000 mg | ORAL_TABLET | Freq: Once | ORAL | Status: AC
  Administered 2013-05-15: 2.5 mg via ORAL
  Filled 2013-05-15: qty 1

## 2013-05-15 MED ORDER — LACTATED RINGERS IV SOLN
INTRAVENOUS | Status: DC | PRN
  Administered 2013-05-15: 11:00:00 via INTRAVENOUS

## 2013-05-15 MED ORDER — SODIUM CHLORIDE 0.9 % IV SOLN: 250.0000 mL | INTRAVENOUS | Status: DC | PRN

## 2013-05-15 NOTE — Progress Notes (Signed)
  ANTICOAGULATION CONSULT NOTE - Follow Up Consult  Pharmacy Consult for Coumadin Indication: atrial fibrillation  No Known Allergies  Patient Measurements: Height: 5\' 11"  (180.3 cm) Weight: 212 lb 4.9 oz (96.3 kg) IBW/kg (Calculated) : 75.3 Heparin Dosing Weight:    Vital Signs: Temp: 98.2 F (36.8 C) (09/09 0800) Temp src: Oral (09/09 0800) BP: 133/46 mmHg (09/09 0800)  Labs:  Recent Labs  05/13/13 0449 05/14/13 0354 05/15/13 0416  HGB 8.5* 8.9* 8.1*  HCT 26.5* 28.6* 25.5*  PLT 358 362 353  LABPROT 26.2* 26.9* 25.9*  INR 2.50* 2.59* 2.47*  CREATININE 1.88* 1.69* 1.40*    Estimated Creatinine Clearance: 58.1 ml/min (by C-G formula based on Cr of 1.4).   Assessment: Afib/flutter with RVR  Anticoagulation: hx afib, INR 2.47. Dr. Johney Frame with plans for ablation possibly Tuesday 9/9. Hgb 8.1  --PTA warfarin dose: 5mg  MF, 2.5mg  all other days  Cardiovascular: aflutter, CAD, HTN, dCHF, CODE BLUE on 9/4. VSS Meds: amio po, norvasc 5mg , lipitor, IV lasix, fish oil  Endocrinology: TSH normal, gluc 117 on BMET  Nephrology:Scr 1.4 (post arrest) improving, K 3.5  Pulmonary: intubated 9/4 after code blue for VT, now on 6LNC, 92%  Hematology / Oncology: Hgb 8.1, no overt bleeding. Start iron po per heme/onc recommendations.  PTA Medication Issues:home meds addressed  Best Practices:warfarin  Goal of Therapy:  INR 2-3 Monitor platelets by anticoagulation protocol: Yes   Plan:  Coumaidn 2.5mg  po x 1 tonight Aflutter ablation today   Broox Lonigro S. Merilynn Finland, PharmD, BCPS Clinical Staff Pharmacist Pager [phone]  Misty Stanley Stillinger 05/15/2013,10:02 AM

## 2013-05-15 NOTE — Transfer of Care (Signed)
Immediate Anesthesia Transfer of Care Note  Patient: [name] [name]  Procedure(s) Performed: Procedure(s): ATRIAL FLUTTER ABLATION (N/A)  Patient Location: PACU  Anesthesia Type:General  Level of Consciousness: awake, alert , oriented and responds to stimulation  Airway & Oxygen Therapy: Patient Spontanous Breathing and Patient connected to face mask  Post-op Assessment: Report given to PACU RN, Post -op Vital signs reviewed and stable and Patient moving all extremities  Post vital signs: Reviewed and stable  Complications: No apparent anesthesia complications

## 2013-05-15 NOTE — Progress Notes (Signed)
Patient Name: [name] [name] Date of Encounter: 05/15/2013    SUBJECTIVE: Scared but otherwise okay. Nurse reports desaturation on room air.  TELEMETRY:  NSR: Filed Vitals:   05/14/13 2338 05/15/13 0000 05/15/13 0400 05/15/13 0800  BP:  129/41 144/44 133/46  Pulse:      Temp: 98.6 F (37 C) 98.6 F (37 C) 99.1 F (37.3 C) 98.2 F (36.8 C)  TempSrc: Oral Oral Oral Oral  Resp:    18  Height:      Weight:      SpO2:  91% 92% 92%    Intake/Output Summary (Last 24 hours) at 05/15/13 0843 Last data filed at 05/15/13 0800  Gross per 24 hour  Intake    810 ml  Output   1450 ml  Net   -640 ml    LABS: Basic Metabolic Panel:  Recent Labs  09/81/19 0354 05/15/13 0416  NA 133* 134*  K 4.1 3.5  CL 96 98  CO2 24 25  GLUCOSE 144* 117*  BUN 42* 39*  CREATININE 1.69* 1.40*  CALCIUM 9.1 9.0   CBC:  Recent Labs  05/14/13 0354 05/15/13 0416  WBC 10.1 8.4  HGB 8.9* 8.1*  HCT 28.6* 25.5*  MCV 75.5* 75.2*  PLT 362 353     Radiology/Studies:  No new.  Physical Exam: Blood pressure 133/46, pulse 66, temperature 98.2 F (36.8 C), temperature source Oral, resp. rate 18, height 5\' 11"  (1.803 m), weight 96.3 kg (212 lb 4.9 oz), SpO2 92.00%. Weight change:    2/6 decrescendo AR murmur. Decreased breath sounds at the bases. No edema.  ASSESSMENT:  1. Acute on chronic diastolic heart failure, improved but still hypoxic on room air 2. A. flutter resolved after defibrillation during cardiac arrest. For ablation today. 3. Anemia likely due to iron deficiency. Not due to hemolysis 4. Aortic regurgitation 5. Renal function improved.  Plan:  1. A flutter ablation today 2. PA and lateral CXR later today. 3. Resume IV diuresis   Signed, Lesleigh Noe 05/15/2013, 8:43 AM

## 2013-05-15 NOTE — H&P (View-Only) (Signed)
SUBJECTIVE: The patient is doing well today.  At this time, he denies chest pain, shortness of breath, or any new concerns.  Notes in chart about bloody bowel movement are about a different patient.   Iron studies resulted, hematology recommends iron supplement twice daily for iron deficiency anemia.   Maintaining SR on Amidoarone.  Plan for ablation today.   CURRENT MEDICATIONS: . amiodarone  200 mg Oral TID  . amLODipine  5 mg Oral Daily  . antiseptic oral rinse  15 mL Mouth Rinse BID  . atorvastatin  10 mg Oral q1800  . ferrous sulfate  325 mg Oral TID WC  . insulin aspart  0-15 Units Subcutaneous TID WC  . omega-3 acid ethyl esters  1 g Oral TID  . pantoprazole  40 mg Oral Daily  . sodium chloride  3 mL Intravenous Q12H  . Warfarin - Pharmacist Dosing Inpatient   Does not apply q1800   . sodium chloride 5 mL/hr at 05/11/13 1900    OBJECTIVE: Physical Exam: Filed Vitals:   05/14/13 2000 05/14/13 2338 05/15/13 0000 05/15/13 0400  BP: 141/43  129/41 144/44  Pulse:      Temp: 99.2 F (37.3 C) 98.6 F (37 C) 98.6 F (37 C) 99.1 F (37.3 C)  TempSrc: Oral Oral Oral Oral  Resp: 16     Height:      Weight:      SpO2: 97%  91% 92%    Intake/Output Summary (Last 24 hours) at 05/15/13 0622 Last data filed at 05/15/13 0200  Gross per 24 hour  Intake    810 ml  Output   1175 ml  Net   -365 ml    Telemetry reveals sinus rhythm, prolonged QT  GEN- The patient is well appearing, alert and oriented x 3 today.   Head- normocephalic, atraumatic Eyes-  Sclera clear, conjunctiva pink Ears- hearing intact Oropharynx- clear Neck- supple, no JVP Lymph- no cervical lymphadenopathy Lungs- Clear to ausculation bilaterally, normal work of breathing Heart- Regular rate and rhythm  GI- soft, NT, ND, + BS Extremities- no clubbing, cyanosis, or edema L chest CVL in place  LABS: Basic Metabolic Panel:  Recent Labs  40/98/11 0354 05/15/13 0416  NA 133* 134*  K 4.1 3.5    CL 96 98  CO2 24 25  GLUCOSE 144* 117*  BUN 42* 39*  CREATININE 1.69* 1.40*  CALCIUM 9.1 9.0   CBC:  Recent Labs  05/14/13 0354 05/15/13 0416  WBC 10.1 8.4  HGB 8.9* 8.1*  HCT 28.6* 25.5*  MCV 75.5* 75.2*  PLT 362 353   Anemia Panel:  Recent Labs  05/13/13 1410  VITAMINB12 867  FOLATE >20.0  FERRITIN 109  TIBC 349  IRON 12*  RETICCTPCT 1.5    RADIOLOGY: X-ray Chest Pa And Lateral 05/09/2013   *RADIOLOGY REPORT*  Clinical Data: Congestive failure  CHEST - 2 VIEW  Comparison: 11/17/2012  Findings: Cardiac shadow is mildly enlarged.  Postsurgical changes are again seen.  Blunting of the costophrenic angles is noted bilaterally new from prior exam.  Scattered calcified granulomas are seen.  No focal confluent infiltrate is seen.  IMPRESSION: Small bilateral pleural effusions with mild bibasilar atelectasis. No focal confluent infiltrate is seen.   Original Report Authenticated By: Alcide Clever, M.D.   Dg Chest Port 1 View 05/13/2013   *RADIOLOGY REPORT*  Clinical Data: Pulmonary edema  PORTABLE CHEST - 1 VIEW  Comparison: 05/11/2013  Findings: Extubation.  Left subclavian line which terminates  at the high SVC. Prior median sternotomy.  Cardiomegaly accentuated by AP portable technique.  Similar small layering right pleural effusion. No pneumothorax.  Minimal improvement.  mild to moderate interstitial edema.  Skin fold over the right hemithorax.  Right greater than left bibasilar airspace disease, similar.  IMPRESSION: Mild improvement in congestive heart failure.  Similar small right pleural effusion with bibasilar airspace disease, likely atelectasis.  Interval extubation.   Original Report Authenticated By: Jeronimo Greaves, M.D.   ASSESSMENT AND PLAN:  Principal Problem:   Atrial flutter with rapid ventricular response Active Problems:   Prosthetic valve dysfunction   Acute diastolic heart failure   Hemolytic anemia, acute   Cardiac arrest   V-tach   Acute respiratory failure  with hypoxia   Pulmonary edema   Hyponatremia  1. Polymorphic VT  Due to prolonged 1:1 atrial flutter with V rates 180s.  I think that the appropriate intervention will be ablation of his atrial flutter   2. Recurrent atrial flutter  Therapeutic strategies for atrial flutter including medicine and ablation have been discussed in detail with the patient. Risk, benefits, and alternatives to EP study and radiofrequency ablation were also discussed in detail today. These risks include but are not limited to stroke, bleeding, vascular damage, tamponade, perforation, damage to the heart and other structures, AV block requiring pacemaker, worsening renal function, and death. The patient understands these risk and wishes to proceed.  We will therefore proceed with catheter ablation this am with anesthesia assistance.  3. Acute on chronic diastolic HF  contnue gentle diuresis   4. OSA - recently diagnosed; untreated currently   5. Anemia - appreciate heme input  Will remove CVL to reduce infectious risks

## 2013-05-15 NOTE — Progress Notes (Signed)
Patient given 20fr. foley catheter per MD order. Patient tolerated the procedure well and verification of urine return with total output of 700cc documented in flow sheet.

## 2013-05-15 NOTE — Op Note (Signed)
PREPROCEDURE DIAGNOSIS: Typical-appearing atrial flutter.   POSTPROCEDURE DIAGNOSIS: Isthmus-dependent right atrial flutter.   PROCEDURES:  1. Comprehensive EP study.  2. Coronary sinus pacing and recording.  3. Mapping of SVT.  4. Ablation of SVT.   INTRODUCTION:  [name] [name] is a 72 y.o. male with a history of symptomatic typical-appearing atrial flutter. He presents today for EP study and radiofrequency ablation.   DESCRIPTION OF THE PROCEDURE: Informed written consent was obtained, and the patient was brought to the electrophysiology lab in the fasting state. The patient was then adequately sedated with anesthesia as outlined in the anesthesia report.  Per anesthesia, upon lying the patient supine for the procedure, he developed progressive shortness of breath and oxygen desaturation.  He required urgent intubation by the anesthesia attending before the procedure today began.  At the patient subsequently stabilized, I felt that it was most prudent to proceed with ablation, particularly given recent 1:1 AV conducting atrial flutter with degeneration into ventricular fibrillation.  The patient's right groin was therefore prepped and draped in the usual sterile fashion by the EP lab staff. Using a percutaneous Seldinger technique a 6, 7, and 8-French hemostasis sheaths were placed into the right common femoral vein. A 7- [employer] decapolar coronary sinus catheter was introduced through the right common femoral vein and advanced into the coronary sinus for recording and pacing from this location. A 6-French quadripolar Josephson catheter was introduced through the right common femoral vein and advanced into the right ventricle for recording and pacing. This catheter was then pulled back to the His bundle location. The patient presented to the electrophysiology lab in sinus rhythm.  His PR interval measured with a QRS of 132 msec and a Qtc 599 msec.  The RR interval was 1037  msec.  The AH inteval was 114 msec with an HV interval of 48 msec.  Ventricular pacing was performed which revealed VA dissociation when pacing at a cycle length of 700 msec.   Rapid atrial pacing was performed which revealed an AV wenkebach cycle length of 400 msec.  With rapid atrial pacing at 300 msec, atrial flutter was induced.   The surface electrocardiogram was consistent with typical atrial flutter. The coronary sinus activation sequence was proximal to distal and suggestive of right atrial flutter.  The atrial flutter cycle length was 370 msec. Atrial entrainment mapping was then performed. With entrainment mapping from the cavotricuspid isthmus, the post pacing interval was equal to the tachycardia cycle length and therefore suggestive of isthmus-dependent right atrial flutter.   I elected to perform cavotricuspid isthmus ablation.   A [employer] 7-French  10mm ablation catheter was introduced through the right common femoral vein and advanced into the right atrium. Mapping of the cavotricuspid isthmus was performed which revealed a large isthmus.  The right atrium was also quite large.  A series of 15 radiofrequency applications were delivered along the cavotricuspid isthmus with a target temperature of 60 degrees with power of 70 watts. During the 1st radiofrequency ablation lesion, the tachycardia slowed and then terminated. The patient remained in sinus rhythm thereafter.  The additional radiofrequency applications were then delivered with similar parameters in order to obtain complete bidirectional cavotricuspid isthmus block.   Following ablation, differential atrial pacing was performed from the low lateral right atrium with a Duodecapolar halo catheter in place. This confirmed complete bidirectional cavotricuspid isthmus block with a stimulus to earliest atrial activation recorded bidirectional across the isthmus measuring 170 msec. The patient was  observed for 20 minutes without  return of conduction through the isthmus.  Following ablation, the AH interval measured 121 msec with an HV interval of 54 msec. Rapid atrial pacing was performed, which revealed an AV Wenckebach cycle length of 420 msec with no evidence of PR greater than RR and no tachycardias induced when pacing down to a cycle length of 250 msec.   The procedure was therefore considered completed. All catheters were removed, and the sheaths were aspirated and flushed. The sheaths were removed and hemostasis was assured. There were no early apparent complications.   CONCLUSIONS:  1. Isthmus-dependent right atrial flutter upon presentation, successfully ablated along the usual cavotricuspid isthmus.  2. Complete bidirectional cavotricuspid isthmus block achieved.  3. No inducible arrhythmias following ablation.  4. No early apparent complications, though the patient did develop progressive SOB with anesthesia induction requiring intubation.  He will be transferred to the PACU for close airway management.  IV lasix has been administred.   Fayrene Fearing Thomasene Dubow,MD 05/15/2013 12:49 PM

## 2013-05-15 NOTE — Anesthesia Procedure Notes (Addendum)
Performed by: Whitman Hero    Procedure Name: LMA Insertion Date/Time: 05/15/2013 11:27 AM Performed by: Whitman Hero Pre-anesthesia Checklist: Patient identified, Emergency Drugs available, Suction available, Patient being monitored and Timeout performed Patient Re-evaluated:Patient Re-evaluated prior to inductionOxygen Delivery Method: Circle system utilized Preoxygenation: Pre-oxygenation with 100% oxygen Intubation Type: IV induction LMA: LMA inserted LMA Size: 4.0    Procedure Name: Intubation Date/Time: 05/15/2013 11:34 AM Performed by: Whitman Hero Pre-anesthesia Checklist: Patient identified, Emergency Drugs available, Suction available, Patient being monitored and Timeout performed Patient Re-evaluated:Patient Re-evaluated prior to inductionOxygen Delivery Method: Circle system utilized Preoxygenation: Pre-oxygenation with 100% oxygen Intubation Type: IV induction Ventilation: Mask ventilation without difficulty Grade View: Grade I Tube type: Subglottic suction tube Tube size: 7.5 mm Airway Equipment and Method: Stylet Placement Confirmation: ETT inserted through vocal cords under direct vision,  positive ETCO2 and breath sounds checked- equal and bilateral Secured at: 24 cm Tube secured with: Tape

## 2013-05-15 NOTE — Progress Notes (Addendum)
[name] [name],RCIS  Pulled 5fr.,  80fr.,8fr. venous sheaths in PACU unit and held manual pressure for 20 min. No hematoma or bleeding. Patient given instructions to not sit up, move right leg or lift head.

## 2013-05-15 NOTE — Anesthesia Postprocedure Evaluation (Signed)
  Anesthesia Post-op Note  Patient: [name] [name]  Procedure(s) Performed: Procedure(s): ATRIAL FLUTTER ABLATION (N/A)  Patient Location: PACU  Anesthesia Type:General  Level of Consciousness: awake, alert  and oriented  Airway and Oxygen Therapy: Patient Spontanous Breathing and on CPAP  Post-op Pain: none  Post-op Assessment: Post-op Vital signs reviewed, Patient's Cardiovascular Status Stable and Respiratory Function Stable  Post-op Vital Signs: Reviewed and stable  Complications: No apparent anesthesia complications

## 2013-05-15 NOTE — Interval H&P Note (Signed)
History and Physical Interval Note:  05/15/2013 8:06 AM  [name] [name]  has presented today for surgery, with the diagnosis of aflutter  The various methods of treatment have been discussed with the patient and family. After consideration of risks, benefits and other options for treatment, the patient has consented to  Procedure(s): ATRIAL FLUTTER ABLATION (N/A) as a surgical intervention .  The patient's history has been reviewed, patient examined, no change in status, stable for surgery.  I have reviewed the patient's chart and labs.  Questions were answered to the patient's satisfaction.     Hillis Range

## 2013-05-15 NOTE — Anesthesia Preprocedure Evaluation (Signed)
Anesthesia Evaluation  Patient identified by MRN, date of birth, ID band Patient awake    Reviewed: Allergy & Precautions, H&P , NPO status , Patient's Chart, lab work & pertinent test results  Airway       Dental no notable dental hx.    Pulmonary sleep apnea ,    Pulmonary exam normal       Cardiovascular hypertension, On Medications + angina + CAD negative cardio ROS  + dysrhythmias Atrial Fibrillation  S/p AVR   Neuro/Psych negative neurological ROS  negative psych ROS   GI/Hepatic negative GI ROS, Neg liver ROS,   Endo/Other  negative endocrine ROS  Renal/GU negative Renal ROS  negative genitourinary   Musculoskeletal   Abdominal   Peds  Hematology  (+) anemia ,   Anesthesia Other Findings   Reproductive/Obstetrics negative OB ROS                           Anesthesia Physical Anesthesia Plan  ASA: III  Anesthesia Plan: General   Post-op Pain Management:    Induction: Intravenous  Airway Management Planned: LMA  Additional Equipment:   Intra-op Plan:   Post-operative Plan: Extubation in OR  Informed Consent: I have reviewed the patients History and Physical, chart, labs and discussed the procedure including the risks, benefits and alternatives for the proposed anesthesia with the patient or authorized representative who has indicated his/her understanding and acceptance.   Dental advisory given  Plan Discussed with: CRNA  Anesthesia Plan Comments:         Anesthesia Quick Evaluation

## 2013-05-15 NOTE — Progress Notes (Signed)
 SUBJECTIVE: The patient is doing well today.  At this time, he denies chest pain, shortness of breath, or any new concerns.  Notes in chart about bloody bowel movement are about a different patient.   Iron studies resulted, hematology recommends iron supplement twice daily for iron deficiency anemia.   Maintaining SR on Amidoarone.  Plan for ablation today.   CURRENT MEDICATIONS: . amiodarone  200 mg Oral TID  . amLODipine  5 mg Oral Daily  . antiseptic oral rinse  15 mL Mouth Rinse BID  . atorvastatin  10 mg Oral q1800  . ferrous sulfate  325 mg Oral TID WC  . insulin aspart  0-15 Units Subcutaneous TID WC  . omega-3 acid ethyl esters  1 g Oral TID  . pantoprazole  40 mg Oral Daily  . sodium chloride  3 mL Intravenous Q12H  . Warfarin - Pharmacist Dosing Inpatient   Does not apply q1800   . sodium chloride 5 mL/hr at 05/11/13 1900    OBJECTIVE: Physical Exam: Filed Vitals:   05/14/13 2000 05/14/13 2338 05/15/13 0000 05/15/13 0400  BP: 141/43  129/41 144/44  Pulse:      Temp: 99.2 F (37.3 C) 98.6 F (37 C) 98.6 F (37 C) 99.1 F (37.3 C)  TempSrc: Oral Oral Oral Oral  Resp: 16     Height:      Weight:      SpO2: 97%  91% 92%    Intake/Output Summary (Last 24 hours) at 05/15/13 0622 Last data filed at 05/15/13 0200  Gross per 24 hour  Intake    810 ml  Output   1175 ml  Net   -365 ml    Telemetry reveals sinus rhythm, prolonged QT  GEN- The patient is well appearing, alert and oriented x 3 today.   Head- normocephalic, atraumatic Eyes-  Sclera clear, conjunctiva pink Ears- hearing intact Oropharynx- clear Neck- supple, no JVP Lymph- no cervical lymphadenopathy Lungs- Clear to ausculation bilaterally, normal work of breathing Heart- Regular rate and rhythm  GI- soft, NT, ND, + BS Extremities- no clubbing, cyanosis, or edema L chest CVL in place  LABS: Basic Metabolic Panel:  Recent Labs  05/14/13 0354 05/15/13 0416  NA 133* 134*  K 4.1 3.5    CL 96 98  CO2 24 25  GLUCOSE 144* 117*  BUN 42* 39*  CREATININE 1.69* 1.40*  CALCIUM 9.1 9.0   CBC:  Recent Labs  05/14/13 0354 05/15/13 0416  WBC 10.1 8.4  HGB 8.9* 8.1*  HCT 28.6* 25.5*  MCV 75.5* 75.2*  PLT 362 353   Anemia Panel:  Recent Labs  05/13/13 1410  VITAMINB12 867  FOLATE >20.0  FERRITIN 109  TIBC 349  IRON 12*  RETICCTPCT 1.5    RADIOLOGY: X-ray Chest Pa And Lateral 05/09/2013   *RADIOLOGY REPORT*  Clinical Data: Congestive failure  CHEST - 2 VIEW  Comparison: 11/17/2012  Findings: Cardiac shadow is mildly enlarged.  Postsurgical changes are again seen.  Blunting of the costophrenic angles is noted bilaterally new from prior exam.  Scattered calcified granulomas are seen.  No focal confluent infiltrate is seen.  IMPRESSION: Small bilateral pleural effusions with mild bibasilar atelectasis. No focal confluent infiltrate is seen.   Original Report Authenticated By: Mark Lukens, M.D.   Dg Chest Port 1 View 05/13/2013   *RADIOLOGY REPORT*  Clinical Data: Pulmonary edema  PORTABLE CHEST - 1 VIEW  Comparison: 05/11/2013  Findings: Extubation.  Left subclavian line which terminates   at the high SVC. Prior median sternotomy.  Cardiomegaly accentuated by AP portable technique.  Similar small layering right pleural effusion. No pneumothorax.  Minimal improvement.  mild to moderate interstitial edema.  Skin fold over the right hemithorax.  Right greater than left bibasilar airspace disease, similar.  IMPRESSION: Mild improvement in congestive heart failure.  Similar small right pleural effusion with bibasilar airspace disease, likely atelectasis.  Interval extubation.   Original Report Authenticated By: Kyle Talbot, M.D.   ASSESSMENT AND PLAN:  Principal Problem:   Atrial flutter with rapid ventricular response Active Problems:   Prosthetic valve dysfunction   Acute diastolic heart failure   Hemolytic anemia, acute   Cardiac arrest   V-tach   Acute respiratory failure  with hypoxia   Pulmonary edema   Hyponatremia  1. Polymorphic VT  Due to prolonged 1:1 atrial flutter with V rates 180s.  I think that the appropriate intervention will be ablation of his atrial flutter   2. Recurrent atrial flutter  Therapeutic strategies for atrial flutter including medicine and ablation have been discussed in detail with the patient. Risk, benefits, and alternatives to EP study and radiofrequency ablation were also discussed in detail today. These risks include but are not limited to stroke, bleeding, vascular damage, tamponade, perforation, damage to the heart and other structures, AV block requiring pacemaker, worsening renal function, and death. The patient understands these risk and wishes to proceed.  We will therefore proceed with catheter ablation this am with anesthesia assistance.  3. Acute on chronic diastolic HF  contnue gentle diuresis   4. OSA - recently diagnosed; untreated currently   5. Anemia - appreciate heme input  Will remove CVL to reduce infectious risks   

## 2013-05-15 NOTE — OR Nursing (Signed)
Sheath in place in right groin on arrival to PACU. Dressing dry and intact. No bleeding noted.

## 2013-05-16 LAB — GLUCOSE, CAPILLARY
Glucose-Capillary: 169 mg/dL — ABNORMAL HIGH (ref 70–99)
Glucose-Capillary: 176 mg/dL — ABNORMAL HIGH (ref 70–99)
Glucose-Capillary: 152 mg/dL — ABNORMAL HIGH (ref 70–99)

## 2013-05-16 LAB — TYPE AND SCREEN: Antibody Screen: NEGATIVE

## 2013-05-16 LAB — COMPREHENSIVE METABOLIC PANEL
AST: 84 U/L — ABNORMAL HIGH (ref 0–37)
BUN: 36 mg/dL — ABNORMAL HIGH (ref 6–23)
CO2: 28 mEq/L (ref 19–32)
Calcium: 8.8 mg/dL (ref 8.4–10.5)
Creatinine, Ser: 1.52 mg/dL — ABNORMAL HIGH (ref 0.50–1.35)
GFR calc non Af Amer: 45 mL/min — ABNORMAL LOW (ref >90)

## 2013-05-16 LAB — CBC
HCT: 27.2 % — ABNORMAL LOW (ref 39.0–52.0)
Hemoglobin: 8.5 g/dL — ABNORMAL LOW (ref 13.0–17.0)
MCH: 23.5 pg — ABNORMAL LOW (ref 26.0–34.0)
MCH: 23.7 pg — ABNORMAL LOW (ref 26.0–34.0)
MCHC: 31.3 g/dL (ref 30.0–36.0)
MCV: 75.9 fL — ABNORMAL LOW (ref 78.0–100.0)
Platelets: 377 10*3/uL (ref 150–400)
RBC: 3.4 MIL/uL — ABNORMAL LOW (ref 4.22–5.81)
RBC: 3.59 MIL/uL — ABNORMAL LOW (ref 4.22–5.81)
RDW: 17.4 % — ABNORMAL HIGH (ref 11.5–15.5)

## 2013-05-16 MED ORDER — FUROSEMIDE 80 MG PO TABS
80.0000 mg | ORAL_TABLET | Freq: Every day | ORAL | Status: DC
  Administered 2013-05-17: 80 mg via ORAL
  Filled 2013-05-16 (×2): qty 1

## 2013-05-16 MED ORDER — FUROSEMIDE 10 MG/ML IJ SOLN
80.0000 mg | Freq: Two times a day (BID) | INTRAMUSCULAR | Status: AC
  Administered 2013-05-16: 80 mg via INTRAVENOUS

## 2013-05-16 MED ORDER — WARFARIN SODIUM 5 MG PO TABS
5.0000 mg | ORAL_TABLET | ORAL | Status: DC
  Filled 2013-05-16: qty 1

## 2013-05-16 MED ORDER — WARFARIN SODIUM 2.5 MG PO TABS
2.5000 mg | ORAL_TABLET | ORAL | Status: DC
  Administered 2013-05-16 – 2013-05-17 (×2): 2.5 mg via ORAL
  Filled 2013-05-16 (×2): qty 1

## 2013-05-16 NOTE — Progress Notes (Signed)
Patient Name: [name] [name] Date of Encounter: 05/16/2013    SUBJECTIVE: The patient had a good night. He did not require BiPAP. O2 saturations are now in the mid 90s.  TELEMETRY:  NSR on telemetry: Filed Vitals:   05/16/13 0438 05/16/13 0600 05/16/13 0700 05/16/13 0800  BP:  119/37 134/46 135/43  Pulse:  56 61 65  Temp:    98.7 F (37.1 C)  TempSrc:    Oral  Resp:  21 24 19   Height:      Weight: 96.7 kg (213 lb 3 oz)     SpO2:  93% 97% 99%    Intake/Output Summary (Last 24 hours) at 05/16/13 0914 Last data filed at 05/16/13 0800  Gross per 24 hour  Intake   1050 ml  Output   5200 ml  Net  -4150 ml    LABS: Basic Metabolic Panel:  Recent Labs  16/10/96 1626 05/16/13 0509  NA 138 136  K 3.5 3.4*  CL 100 99  CO2 27 28  GLUCOSE 123* 125*  BUN 35* 36*  CREATININE 1.34 1.52*  CALCIUM 8.8 8.8   CBC:  Recent Labs  05/15/13 0416 05/16/13 0509  WBC 8.4 9.8  HGB 8.1* 8.0*  HCT 25.5* 25.8*  MCV 75.2* 75.9*  PLT 353 377   I/O:  4100 cc negative over past 24 hours  Radiology/Studies:  05/13/13, CHF  Physical Exam: Blood pressure 135/43, pulse 65, temperature 98.7 F (37.1 C), temperature source Oral, resp. rate 19, height 5\' 11"  (1.803 m), weight 96.7 kg (213 lb 3 oz), SpO2 99.00%. Weight change:    Rhonchi at both bases 2/6/ decrescendo murmur AR Trace LE edema No neuro abnormality  ASSESSMENT:  1. Acute combined systolic and diastolic heart failure with significant overnight diuresis and improvement in respiratory status. 2. A flutter ablation successful. Will decrease amiodarone to 200 mg daily 3. Acute on chronic kidney failure 4. Aortic regurgitation 5. Severe anemia with hemoglobin 8.0   Plan:  1. Decrease intensity of diuresis 2. Consider transfusion 3. D/C central line 4. Phase 1 cardiac rehab  Signed, Lesleigh Noe 05/16/2013, 9:14 AM

## 2013-05-16 NOTE — Progress Notes (Signed)
Orders received to transfuse 2 units of prbc's if hgb came back less than 8.5. Hgb at 1959 was 8.5. Prbc's not given. Patient and wife did not want prbc's. Only willing to get them if hgb much lower.

## 2013-05-16 NOTE — Progress Notes (Signed)
  ANTICOAGULATION CONSULT NOTE - Follow Up Consult  Pharmacy Consult for Coumadin Indication: atrial fibrillation  No Known Allergies  Patient Measurements: Height: 5\' 11"  (180.3 cm) Weight: 213 lb 3 oz (96.7 kg) IBW/kg (Calculated) : 75.3 Heparin Dosing Weight:    Vital Signs: Temp: 98.7 F (37.1 C) (09/10 0800) Temp src: Oral (09/10 0800) BP: 135/43 mmHg (09/10 0800) Pulse Rate: 65 (09/10 0800)  Labs:  Recent Labs  05/14/13 0354 05/15/13 0416 05/15/13 1626 05/16/13 0509  HGB 8.9* 8.1*  --  8.0*  HCT 28.6* 25.5*  --  25.8*  PLT 362 353  --  377  LABPROT 26.9* 25.9*  --  26.7*  INR 2.59* 2.47*  --  2.57*  CREATININE 1.69* 1.40* 1.34 1.52*    Estimated Creatinine Clearance: 53.7 ml/min (by C-G formula based on Cr of 1.52).   Assessment: Afib/flutter with RVR  Anticoagulation: hx afib, INR 2.57. Hgb 8 --PTA warfarin dose: 5mg  MF, 2.5mg  all other days  Cardiovascular: aflutter, CAD, HTN, dCHF, CODE BLUE on 9/4. Successful aflutter ablation 9/9. D/c'd amio. VSS Meds: norvasc 5mg , lipitor, IV lasix, fish oil  Endocrinology: TSH normal, CBGs  110-176 On SSI  Nephrology: CRI, Scr 1.52 up possibly due to diuresis, K 3.4  Pulmonary: OSA, intubated 9/4 after code blue for VT, now on 3LNC, 99%  Hematology / Oncology: Hgb 8., no overt bleeding. Start iron po per heme/onc recommendations.  PTA Medication Issues:home meds addressed  Best Practices:warfarin, PPI   Goal of Therapy:  INR 2-3 Monitor platelets by anticoagulation protocol: Yes   Plan:  Coumaidn 2.5mg  daily except 5mg  MF as PTA ( x 4 wks post-ablation)   Jennette Leask S. Merilynn Finland, PharmD, [facility] Clinical Staff Pharmacist Pager [phone]  Misty Stanley Stillinger 05/16/2013,10:46 AM

## 2013-05-16 NOTE — Progress Notes (Signed)
SUBJECTIVE: The patient is doing well today. His SOB continues to improve.  He diuresed well yesterday.  CURRENT MEDICATIONS: . amLODipine  5 mg Oral Daily  . antiseptic oral rinse  15 mL Mouth Rinse BID  . atorvastatin  10 mg Oral q1800  . ferrous sulfate  325 mg Oral TID WC  . furosemide  80 mg Intravenous Q8H  . insulin aspart  0-15 Units Subcutaneous TID WC  . omega-3 acid ethyl esters  1 g Oral TID  . pantoprazole  40 mg Oral Daily  . sodium chloride  3 mL Intravenous Q12H  . Warfarin - Pharmacist Dosing Inpatient   Does not apply q1800      OBJECTIVE: Physical Exam: Filed Vitals:   05/16/13 0300 05/16/13 0400 05/16/13 0438 05/16/13 0600  BP: 126/43 133/34  119/37  Pulse: 63 59  56  Temp:      TempSrc:      Resp: 19 18  21   Height:      Weight:   213 lb 3 oz (96.7 kg)   SpO2: 93% 94%  93%    Intake/Output Summary (Last 24 hours) at 05/16/13 0743 Last data filed at 05/16/13 0600  Gross per 24 hour  Intake   1050 ml  Output   4875 ml  Net  -3825 ml    Telemetry reveals sinus rhythm, prolonged QT  GEN- The patient is well appearing, alert and oriented x 3 today.   Head- normocephalic, atraumatic Eyes-  Sclera clear, conjunctiva pink Ears- hearing intact Oropharynx- clear Neck- supple, + elevated JVP Lungs- few basilar rales, normal work of breathing Heart- Regular rate and rhythm  GI- soft, NT, ND, + BS Extremities- no clubbing, cyanosis, + dependant edema L chest CVL in place, foley in place  LABS: Basic Metabolic Panel:  Recent Labs  59/56/38 1626 05/16/13 0509  NA 138 136  K 3.5 3.4*  CL 100 99  CO2 27 28  GLUCOSE 123* 125*  BUN 35* 36*  CREATININE 1.34 1.52*  CALCIUM 8.8 8.8   CBC:  Recent Labs  05/15/13 0416 05/16/13 0509  WBC 8.4 9.8  HGB 8.1* 8.0*  HCT 25.5* 25.8*  MCV 75.2* 75.9*  PLT 353 377   Anemia Panel:  Recent Labs  05/13/13 1410  VITAMINB12 867  FOLATE >20.0  FERRITIN 109  TIBC 349  IRON 12*  RETICCTPCT  1.5    RADIOLOGY: X-ray Chest Pa And Lateral 05/09/2013   *RADIOLOGY REPORT*  Clinical Data: Congestive failure  CHEST - 2 VIEW  Comparison: 11/17/2012  Findings: Cardiac shadow is mildly enlarged.  Postsurgical changes are again seen.  Blunting of the costophrenic angles is noted bilaterally new from prior exam.  Scattered calcified granulomas are seen.  No focal confluent infiltrate is seen.  IMPRESSION: Small bilateral pleural effusions with mild bibasilar atelectasis. No focal confluent infiltrate is seen.   Original Report Authenticated By: Alcide Clever, M.D.   Dg Chest Port 1 View 05/13/2013   *RADIOLOGY REPORT*  Clinical Data: Pulmonary edema  PORTABLE CHEST - 1 VIEW  Comparison: 05/11/2013  Findings: Extubation.  Left subclavian line which terminates at the high SVC. Prior median sternotomy.  Cardiomegaly accentuated by AP portable technique.  Similar small layering right pleural effusion. No pneumothorax.  Minimal improvement.  mild to moderate interstitial edema.  Skin fold over the right hemithorax.  Right greater than left bibasilar airspace disease, similar.  IMPRESSION: Mild improvement in congestive heart failure.  Similar small right pleural effusion with  bibasilar airspace disease, likely atelectasis.  Interval extubation.   Original Report Authenticated By: Jeronimo Greaves, M.D.   ASSESSMENT AND PLAN:  Principal Problem:   Atrial flutter with rapid ventricular response Active Problems:   Prosthetic valve dysfunction   Acute diastolic heart failure   Hemolytic anemia, acute   Cardiac arrest   V-tach   Acute respiratory failure with hypoxia   Pulmonary edema   Hyponatremia  1. Polymorphic VT  Should not recur s/p atrial flutter ablation I have stopped amiodarone Resume beta blocker as CHF allows No further intervention planned  2. Atrial flutter  Atrial flutter ablation went well yesterday.  He is a very high risk patient from a CV and respiratory standpoint but was extubated post  procedure.  Stop amiodarone.  Continue anticoagulation for at least 4 weeks.    3. Acute on chronic diastolic HF  contnue diuresis as per Dr Katrinka Blazing  4. Acute on chronic renal failure Likely due to robust diuresis Continue to follow creatinine with diuresis He did not receive any contrast with his procedure yesterday  5. OSA - recently diagnosed; untreated currently   6. Anemia - appreciate heme input  7. Prolonged QT- likely due to amiodarone.  I have stopped amiodarone.  Avoid qt prolonging drugs in the future  Will need remove CVL and foley asap to reduce infectious risks  Dr Katrinka Blazing to manage CHF going forward. Ep will see as needed Please call with questions

## 2013-05-16 NOTE — Progress Notes (Signed)
CARDIAC REHAB PHASE I   PRE:  Rate/Rhythm: 62 SR    BP: sitting 118/37    SaO2: 98 2L, 94 RA  MODE:  Ambulation: 270 ft   POST:  Rate/Rhythm: 75 SR    BP: sitting 142/36     SaO2: 96 RA   Pt very eager to go home. Ambulated assist x1 with RW. D/c'd O2, pt did not need, no c/o SOB. Only c/o was wobbliness from prolonged bedrest. Encouraged pt to walk this pm with staff. Family sts he has a RW at home. To recliner after walk, elevated legs. Pt seems slightly "foggy" in his thinking/responses. [phone]  Elissa Lovett [place] CES, ACSM 05/16/2013 2:39 PM

## 2013-05-16 NOTE — Progress Notes (Signed)
Will check hemoglobin this PM and if less than 8.5 g/dl, will transfuse 2 units PRBC.

## 2013-05-17 LAB — CBC
HCT: 26.2 % — ABNORMAL LOW (ref 39.0–52.0)
Hemoglobin: 8.2 g/dL — ABNORMAL LOW (ref 13.0–17.0)
MCHC: 31.3 g/dL (ref 30.0–36.0)
MCV: 75.9 fL — ABNORMAL LOW (ref 78.0–100.0)
RDW: 17.5 % — ABNORMAL HIGH (ref 11.5–15.5)

## 2013-05-17 LAB — GLUCOSE, CAPILLARY
Glucose-Capillary: 136 mg/dL — ABNORMAL HIGH (ref 70–99)
Glucose-Capillary: 166 mg/dL — ABNORMAL HIGH (ref 70–99)
Glucose-Capillary: 138 mg/dL — ABNORMAL HIGH (ref 70–99)
Glucose-Capillary: 166 mg/dL — ABNORMAL HIGH (ref 70–99)
Glucose-Capillary: 132 mg/dL — ABNORMAL HIGH (ref 70–99)

## 2013-05-17 LAB — BASIC METABOLIC PANEL
CO2: 27 mEq/L (ref 19–32)
Chloride: 97 mEq/L (ref 96–112)
GFR calc Af Amer: 58 mL/min — ABNORMAL LOW (ref >90)
Potassium: 3.6 mEq/L (ref 3.5–5.1)

## 2013-05-17 LAB — PROTIME-INR: INR: 2.67 — ABNORMAL HIGH (ref 0.00–1.49)

## 2013-05-17 MED ORDER — POTASSIUM CHLORIDE CRYS ER 20 MEQ PO TBCR
20.0000 meq | EXTENDED_RELEASE_TABLET | Freq: Every day | ORAL | Status: DC
  Administered 2013-05-17: 20 meq via ORAL
  Filled 2013-05-17 (×3): qty 1

## 2013-05-17 MED ORDER — AMIODARONE HCL 200 MG PO TABS: 200.0000 mg | ORAL_TABLET | Freq: Every day | ORAL | Status: DC

## 2013-05-17 MED ORDER — AMIODARONE HCL 200 MG PO TABS
200.0000 mg | ORAL_TABLET | Freq: Every day | ORAL | Status: DC
  Administered 2013-05-17: 13:00:00 200 mg via ORAL
  Filled 2013-05-17 (×2): qty 1

## 2013-05-17 MED ORDER — FERROUS SULFATE 325 (65 FE) MG PO TABS: 325.0000 mg | ORAL_TABLET | Freq: Three times a day (TID) | ORAL | Status: DC

## 2013-05-17 NOTE — Progress Notes (Signed)
CARDIAC REHAB PHASE I   PRE:  Rate/Rhythm: 64 SR    BP: sitting 126/31    SaO2: 89-95 RA  MODE:  Ambulation: 520 ft   POST:  Rate/Rhythm: 85 SR    BP: sitting 131/33     SaO2: 95 RA  Pt used RW with assist x1. LOB x1. Needs to use RW at home, which he sts he has. To recliner. Pt asking for high iron diet, which I printed for him. Also will give DM diet. Will continue to follow. Pt is declining HHPT. [phone]   Elissa Lovett [place] CES, ACSM 05/17/2013 9:21 AM

## 2013-05-17 NOTE — Progress Notes (Signed)
Report was called to RN on unit 4 East. Pt will transfer to room 4 East 14

## 2013-05-17 NOTE — Progress Notes (Signed)
  ANTICOAGULATION CONSULT NOTE - Follow Up Consult  Pharmacy Consult for Coumadin Indication: atrial fibrillation  No Known Allergies  Patient Measurements: Height: 5\' 11"  (180.3 cm) Weight: 214 lb 15.2 oz (97.5 kg) IBW/kg (Calculated) : 75.3 Heparin Dosing Weight:    Vital Signs: Temp: 98 F (36.7 C) (09/11 0803) Temp src: Oral (09/11 0803) BP: 145/38 mmHg (09/11 0803) Pulse Rate: 66 (09/11 0803)  Labs:  Recent Labs  05/15/13 0416 05/15/13 1626 05/16/13 0509 05/16/13 1930 05/17/13 0420  HGB 8.1*  --  8.0* 8.5* 8.2*  HCT 25.5*  --  25.8* 27.2* 26.2*  PLT 353  --  377 392 361  LABPROT 25.9*  --  26.7*  --  27.5*  INR 2.47*  --  2.57*  --  2.67*  CREATININE 1.40* 1.34 1.52*  --   --     Estimated Creatinine Clearance: 53.9 ml/min (by C-G formula based on Cr of 1.52).   Assessment: Afib/flutter with RVR  Anticoagulation: hx afib, INR 2.67. Hgb 8.2 --PTA warfarin dose: 5mg  MF, 2.5mg  all other days  Cardiovascular: aflutter, CAD, HTN, dCHF, CODE BLUE on 9/4. Successful aflutter ablation 9/9. D/c'd amio. 145/38, HR 66 Meds: norvasc 5mg , lipitor, po lasix, fish oil  Endocrinology: TSH normal, CBGs  136-176 On SSI  Nephrology: CRI, Scr 1.52 up possibly due to diuresis, K 3.4  Pulmonary: OSA, intubated 9/4 after code blue for VT, now on 3LNC, 98%  Hematology / Oncology: Hgb 8.2, no overt bleeding. Start iron po per heme/onc recommendations.  PTA Medication Issues:home meds addressed  Best Practices:warfarin, PPI   Goal of Therapy:  INR 2-3 Monitor platelets by anticoagulation protocol: Yes   Plan:  Coumaidn 2.5mg  daily except 5mg  MF as PTA ( x 4 wks post-ablation)   Ane Conerly S. Merilynn Finland, PharmD, BCPS Clinical Staff Pharmacist Pager [phone]  Misty Stanley Stillinger 05/17/2013,11:14 AM

## 2013-05-17 NOTE — Discharge Summary (Addendum)
Patient ID: [name] [name] MRN: [id number] DOB/AGE: 04/09/1941 72 y.o.  Admit date: 05/09/2013 Discharge date: 05/18/2013 Patient Active Problem List   Diagnosis Date Noted  . Hemolytic anemia, acute 05/10/2013    Priority: High    Class: Acute  . Acute diastolic heart failure 05/09/2013    Priority: High    Class: Acute  . Atrial flutter with rapid ventricular response 11/30/2011    Priority: High    Class: Acute  . Prosthetic valve dysfunction 12/01/2011    Priority: Medium    Class: Chronic  . Coronary artery disease 12/01/2011    Priority: Medium  . Hyponatremia 05/12/2013  . Cardiac arrest 05/11/2013  . V-tach 05/11/2013  . Acute respiratory failure with hypoxia 05/11/2013  . Pulmonary edema 05/11/2013  . 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 Hypertension Acute on chronic kidney disease, improved at discharge Chronic anticoagulation therapy, Coumadin Amiodarone therapy Coronary atherosclerotic heart disease status post coronary bypass surgery  Significant Diagnostic Studies: Electrophysiologic study and atrial flutter ablation, 05/13/13  Consults: Critical Care Medicine ([facility]), 05/10/13                   Electrophysiology ([facility]) 05/10/13                   Hematology [facility]) 05/13/13                    Hospital Course: The patient was admitted to the hospital on 05/09/13 after developing atrial flutter several days prior to admission. He was noted to be in heart failure decompensation because of arrhythmia. We also re-documented a severe anemia that was diagnosed by his primary physician in mid  August.  Upon admission to the hospital, an electrophysiology consultation was obtained with Dr. Johney Frame with reference to atrial flutter ablation. IV Lasix was started to treat heart failure. An anemia workup was begun. On the second hospital day despite therapy, the atrial flutter causing 1:1 ventricular conduction that degenerated into ventricular fibrillation. He required defibrillation and CPR with intubation. After defibrillation the atrial flutter was converted to normal sinus rhythm. He was extubated within 36 hours of the cardiac arrest. Aggressive diuresis was undertaken. There was a transient decline in renal function due to hypoperfusion. An anemia evaluation was performed by Dr. Clelia Croft. Hemolysis was felt to be unlikely an iron deficiency was felt to be the cause of his anemia. Iron was started.  On 05/13/13 he underwent successful right atrial isthmus ablation of atrial flutter. Amiodarone was converted to oral from IV administration.  After the atrial flutter ablation, additional aggressive diuresis was undertaken because of persistent heart failure. An echocardiogram demonstrated normal systolic function. Moderate aortic regurgitation, paravalvular, was documented.  At discharge the patient is improved although still with significant anemia. He refused transfusion.   Discharge Exam: Blood pressure 133/46, pulse 65, temperature 98.1 F (36.7 C), temperature source Oral, resp. rate 18, height 5\' 6"  (1.676 m), weight 95.573 kg (210 lb 11.2 oz), SpO2 96.00%.    No respiratory distress.  Thank basal rales.  2/6 decrescendo murmur of aortic regurgitation and 2-3 of 6 crescendo decrescendo murmur of aortic outflow origin.  No peripheral edema.  Neurological exam reveals no focal deficits. Affect  is improved. Speech is more spontaneous than on admission.   Labs:   Lab Results  Component Value Date   WBC 9.8 05/18/2013   HGB 8.8* 05/18/2013   HCT 28.4* 05/18/2013   MCV 75.3*  05/18/2013   PLT 433* 05/18/2013    Recent Labs Lab 05/16/13 0509  05/18/13 0610  NA 136  < > 136  K 3.4*  < > 3.8  CL 99  < > 99  CO2 28  < > 28  BUN 36*  < > 30*  CREATININE 1.52*  < > 1.34  CALCIUM 8.8  < > 9.3  PROT 6.7  --   --   BILITOT 0.7  --   --   ALKPHOS 78  --   --   ALT 140*  --   --   AST 84*  --   --   GLUCOSE 125*  < > 133*  < > = values in this interval not displayed. Lab Results  Component Value Date   CKTOTAL 123 11/30/2011   CKMB 3.4 11/30/2011   TROPONINI 0.89* 05/11/2013     Radiology:  Clinical Data: Shortness of breath  PORTABLE CHEST - 1 VIEW  Comparison: 05/13/2013  Findings: Central vascular congestion and central and lung base  interstitial thickening is similar to the previous day's study,  consistent with mild persistent congestive heart failure. There is  hazy lung base opacity, likely a combination of pleural effusions  and atelectasis, greater on the right. No pneumothorax is seen.  Changes from cardiac surgery are stable. The cardiac silhouette is  mildly enlarged.  Left subclavian central venous line is stable with its tip in the  mid superior vena cava.  IMPRESSION:  Allowing for differences in patient positioning and technique,  there has been no significant change. Mild persistent interstitial  edema and small, right greater left, pleural effusions with  associated basilar atelectasis.   EKG: Normal sinus rhythm, LVH, nonspecific ST-T abnormality  FOLLOW UP PLANS AND APPOINTMENTS    Medication List    STOP taking these medications       OVER THE COUNTER MEDICATION      TAKE these medications       amiodarone 200 MG tablet  Commonly known as:  PACERONE  Take 1 tablet (200 mg total) by mouth daily.     amLODipine 5 MG tablet  Commonly known as:  NORVASC  Take 5 mg by mouth daily.     amoxicillin 500 MG capsule  Commonly known as:  AMOXIL  Take 2,000 mg by mouth once as needed (take 1 hour prior to dental appointment).      ferrous sulfate 325 (65 FE) MG tablet  Take 1 tablet (325 mg total) by mouth 3 (three) times daily with meals.     furosemide 80 MG tablet  Commonly known as:  LASIX  Take 40-80 mg by mouth See admin instructions. Takes 1 tablet (80 mg) in the morning and a half tablet (40 mg) at midday     MULTIVITAMIN PO  Take 2-3 tablets by mouth daily.     omega-3 acid ethyl esters 1 G capsule  Commonly known as:  LOVAZA  Take 1 g by mouth 3 (three) times daily.     potassium chloride SA 20 MEQ tablet  Commonly known as:  K-DUR,KLOR-CON  Take 40 mEq by mouth daily.     warfarin 5 MG tablet  Commonly known as:  COUMADIN  Take 2.5-5 mg by mouth  See admin instructions. Takes 1 tablet (5mg ) on Mondays and Fridays and a half tablet (2.5 mg) all other days       Follow-up Information   Follow up with Lesleigh Noe, MD On 05/29/2013. (11 AM and BMET. Please see Riki Rusk next week)    Specialty:  Cardiology   Contact information:   [address] [place] [address] [phone]       BRING ALL MEDICATIONS WITH YOU TO FOLLOW UP APPOINTMENTS  Time spent with patient to include physician time: 40 minutes (per long discussion concerning medications, follow up in Coumadin clinic, arranging GI workup via primary care physician to discover the source of iron deficiency, the importance of dental care and endocarditis prophylaxis, plus multiple other questions posed by the family)  Signed: Lesleigh Noe 05/18/2013, 9:04 AM

## 2013-05-17 NOTE — Progress Notes (Signed)
Patient transferred from 2 Heart to 4E04 via wheelchair. Patient oriented to room and call bell system. Family in room. At this time patient complains of no pain or discomfort at this time. Will continue to monitor to end of shift.

## 2013-05-17 NOTE — Progress Notes (Signed)
Patient ID: [name] [name], male   DOB: [birth year], 72 y.o.   MRN: [id number] Patient Name: [name] [name] Date of Encounter: 05/17/2013    SUBJECTIVE: No chest pain and breathing is improved. He wants to go home. He denies orthopnea and is now off O2 suppliments. Walked with the Rehab and did well other than fatigue.  TELEMETRY:  NSR Filed Vitals:   05/17/13 0418 05/17/13 0423 05/17/13 0755 05/17/13 0803  BP: 121/46   145/38  Pulse:    66  Temp: 98.4 F (36.9 C)   98 F (36.7 C)  TempSrc: Oral   Oral  Resp: 20     Height:      Weight:  97.5 kg (214 lb 15.2 oz)    SpO2: 95%  97% 98%    Intake/Output Summary (Last 24 hours) at 05/17/13 0949 Last data filed at 05/17/13 0900  Gross per 24 hour  Intake   1200 ml  Output   2650 ml  Net  -1450 ml    LABS: Basic Metabolic Panel:  Recent Labs  40/98/11 1626 05/16/13 0509  NA 138 136  K 3.5 3.4*  CL 100 99  CO2 27 28  GLUCOSE 123* 125*  BUN 35* 36*  CREATININE 1.34 1.52*  CALCIUM 8.8 8.8   CBC:  Recent Labs  05/16/13 1930 05/17/13 0420  WBC 10.8* 8.3  HGB 8.5* 8.2*  HCT 27.2* 26.2*  MCV 75.8* 75.9*  PLT 392 361   Radiology/Studies:  No new.  Physical Exam: Blood pressure 145/38, pulse 66, temperature 98 F (36.7 C), temperature source Oral, resp. rate 20, height 5\' 11"  (1.803 m), weight 97.5 kg (214 lb 15.2 oz), SpO2 98.00%. Weight change: 0.8 kg (1 lb 12.2 oz)   2/6 decrescendo murmur of AR Decreased breath sounds at the bases Moderate JVD  ASSESSMENT:  1. Heart failure improved (acute combined systolic and diastolic) 2. Aortic regurgitation, stable 3. Acute kidney injury not yet assessed today. 4. A flutter has been ablated. 5. Anemia still severe and patient refuses transfusion  Plan:  1. Transfer to telemetry 2. Oral therapy x 24 hours and plan d/c in AM.  Signed, Veatrice Kells W 05/17/2013, 9:49 AM

## 2013-05-18 LAB — BASIC METABOLIC PANEL
CO2: 28 mEq/L (ref 19–32)
Chloride: 99 mEq/L (ref 96–112)
GFR calc Af Amer: 60 mL/min — ABNORMAL LOW (ref >90)
Potassium: 3.8 mEq/L (ref 3.5–5.1)
Sodium: 136 mEq/L (ref 135–145)

## 2013-05-18 LAB — CBC
Platelets: 433 10*3/uL — ABNORMAL HIGH (ref 150–400)
RBC: 3.77 MIL/uL — ABNORMAL LOW (ref 4.22–5.81)
RDW: 17.6 % — ABNORMAL HIGH (ref 11.5–15.5)
WBC: 9.8 10*3/uL (ref 4.0–10.5)

## 2013-05-18 LAB — PROTIME-INR: INR: 2.28 — ABNORMAL HIGH (ref 0.00–1.49)

## 2013-05-18 NOTE — Progress Notes (Signed)
Dr Katrinka Blazing here at 0900 to see pt. Orders written for discharge. Wife stated they want to leave now and refused all of pt's am meds. Says she will give them as soon as they get home. D/C tele, D/C IV. Went over all discharge instructions with pt and wife , daughter in room.

## 2013-05-18 NOTE — Progress Notes (Signed)
Pt discharged per w/c with all belongings and d/c paperwork. Wife and pt aware of follow up appts and home meds. Accompanied by Nurse tech. Pt home via private vehicle. Again reminded wife to give al AM meds per schedule written in d/c instructions.

## 2013-05-18 NOTE — Progress Notes (Signed)
  ANTICOAGULATION CONSULT NOTE - Follow Up Consult  Pharmacy Consult for Coumadin Indication: atrial fibrillation  No Known Allergies  Labs:  Recent Labs  05/16/13 0509 05/16/13 1930 05/17/13 0420 05/17/13 1130 05/18/13 0610  HGB 8.0* 8.5* 8.2*  --  8.8*  HCT 25.8* 27.2* 26.2*  --  28.4*  PLT 377 392 361  --  433*  LABPROT 26.7*  --  27.5*  --  24.4*  INR 2.57*  --  2.67*  --  2.28*  CREATININE 1.52*  --   --  1.38* 1.34    Estimated Creatinine Clearance: 55.5 ml/min (by C-G formula based on Cr of 1.34).   Assessment: Afib/flutter with RVR, INR therapeutic, no bleeding noted  Goal of Therapy:  INR 2-3 Monitor platelets by anticoagulation protocol: Yes   Plan:  Coumadin 2.5mg  daily except 5mg  MF as PTA ( x 4 wks post-ablation)  Thank you. Okey Regal, PharmD [phone]  05/18/2013,8:45 AM

## 2013-05-30 ENCOUNTER — Other Ambulatory Visit: Payer: Self-pay | Admitting: Interventional Cardiology

## 2013-05-30 DIAGNOSIS — I5022 Chronic systolic (congestive) heart failure: Secondary | ICD-10-CM

## 2013-06-07 ENCOUNTER — Other Ambulatory Visit (INDEPENDENT_AMBULATORY_CARE_PROVIDER_SITE_OTHER): Payer: Medicare Other

## 2013-06-07 DIAGNOSIS — I5022 Chronic systolic (congestive) heart failure: Secondary | ICD-10-CM

## 2013-06-07 LAB — BASIC METABOLIC PANEL
BUN: 21 mg/dL (ref 6–23)
Calcium: 8.8 mg/dL (ref 8.4–10.5)
Creatinine, Ser: 1.6 mg/dL — ABNORMAL HIGH (ref 0.4–1.5)
Sodium: 136 mEq/L (ref 135–145)

## 2013-06-08 ENCOUNTER — Other Ambulatory Visit: Payer: Self-pay

## 2013-06-08 ENCOUNTER — Telehealth: Payer: Self-pay

## 2013-06-08 DIAGNOSIS — I5030 Unspecified diastolic (congestive) heart failure: Secondary | ICD-10-CM

## 2013-06-08 NOTE — Telephone Encounter (Signed)
Message copied by Jarvis Newcomer on Fri Jun 08, 2013  4:34 PM ------      Message from: Verdis Prime      Created: Thu Jun 07, 2013  5:16 PM       Kidney function is a little impaired, but not greatly different than before. Potassium is low. Verify potassium dose (should be taking 40 meq daily). If so, increase to 60 meq( 3 tabs daily) for 5 days then back to 40 meq daily. Will need repeat BMET in 2 weeks. ------

## 2013-06-08 NOTE — Telephone Encounter (Signed)
Pt wife advised of labs and Dr.Smith instructions.Potassium is low. Verify potassium dose (should be taking 40 meq daily). If so, increase to 60 meq( 3 tabs daily) for 5 days then back to 40 meq daily. Pt will return for repeat bmet 06/21/13.pt wife verbalized understanding

## 2013-06-19 ENCOUNTER — Encounter: Payer: Self-pay | Admitting: Interventional Cardiology

## 2013-06-19 ENCOUNTER — Ambulatory Visit (INDEPENDENT_AMBULATORY_CARE_PROVIDER_SITE_OTHER): Payer: Medicare Other | Admitting: Interventional Cardiology

## 2013-06-19 VITALS — BP 140/50 | HR 51 | Ht 71.0 in | Wt 203.0 lb

## 2013-06-19 DIAGNOSIS — Z5189 Encounter for other specified aftercare: Secondary | ICD-10-CM

## 2013-06-19 DIAGNOSIS — D509 Iron deficiency anemia, unspecified: Secondary | ICD-10-CM

## 2013-06-19 DIAGNOSIS — I2510 Atherosclerotic heart disease of native coronary artery without angina pectoris: Secondary | ICD-10-CM

## 2013-06-19 DIAGNOSIS — I4892 Unspecified atrial flutter: Secondary | ICD-10-CM

## 2013-06-19 DIAGNOSIS — I5031 Acute diastolic (congestive) heart failure: Secondary | ICD-10-CM

## 2013-06-19 DIAGNOSIS — Z7901 Long term (current) use of anticoagulants: Secondary | ICD-10-CM

## 2013-06-19 DIAGNOSIS — T8209XD Other mechanical complication of heart valve prosthesis, subsequent encounter: Secondary | ICD-10-CM

## 2013-06-19 NOTE — Progress Notes (Signed)
Patient ID: [name] [name], male   DOB: 17-Feb-1941, 72 y.o.   MRN: [id number]    HPI The patient feels much better. He denies angina. No orthopnea or PND, are present. Sleep pattern has significantly improved. He denies fever, chills, melena, hematemesis, and hematochezia. There are no palpitations.  No Known Allergies  Current Outpatient Prescriptions  Medication Sig Dispense Refill  . amLODipine (NORVASC) 5 MG tablet Take 5 mg by mouth daily.      Marland Kitchen CALCIUM PO Take by mouth as directed.      . ferrous sulfate 325 (65 FE) MG tablet Take 1 tablet (325 mg total) by mouth 3 (three) times daily with meals.  90 tablet  3  . furosemide (LASIX) 80 MG tablet Take 40-80 mg by mouth See admin instructions. Takes 1.5 tablet (80 mg) in the morning and 1.5 (40 mg) at midday      . Glucosamine HCl (GLUCOSAMINE PO) Take by mouth. Joint Support      . Multiple Vitamins-Minerals (MULTIVITAMIN PO) Take 2-3 tablets by mouth daily.      Marland Kitchen omega-3 acid ethyl esters (LOVAZA) 1 G capsule Take 1 g by mouth 3 (three) times daily.      Marland Kitchen OVER THE COUNTER MEDICATION Ultimate prostate      . OVER THE COUNTER MEDICATION Male factor/Prelox Blue      . OVER THE COUNTER MEDICATION Schizandra/RoseGuard      . OVER THE COUNTER MEDICATION Snack Defense      . OVER THE COUNTER MEDICATION Niteworks      . potassium chloride SA (K-DUR,KLOR-CON) 20 MEQ tablet Take 20 mEq by mouth daily.       . rosuvastatin (CRESTOR) 5 MG tablet Take 5 mg by mouth daily.      Marland Kitchen warfarin (COUMADIN) 5 MG tablet Take 2.5-5 mg by mouth See admin instructions. Takes 1 tablet (5mg ) on Mondays and Fridays and a half tablet (2.5 mg) all other days      . amiodarone (PACERONE) 200 MG tablet Take 1 tablet (200 mg total) by mouth daily.  200 tablet  30  . amoxicillin (AMOXIL) 500 MG capsule Take 2,000 mg by mouth once as needed (take 1 hour prior to dental appointment).       No current facility-administered medications for this visit.    Past Medical  History  Diagnosis Date  . Coronary artery disease   . Angina   . Hypertension   . Atrial flutter   . Obstructive sleep apnea     severe    Past Surgical History  Procedure Laterality Date  . Coronary artery bypass graft    . Cardiac valve replacement      aortic valve replacement (bioprosthetic)  . Tee without cardioversion  11/30/2011    Procedure: TRANSESOPHAGEAL ECHOCARDIOGRAM (TEE);  Surgeon: Quintella Reichert, MD;  Location: [facility] ENDOSCOPY;  Service: Cardiovascular;  Laterality: N/A;    ROS: Completed a sleep study. No followup has been arranged. Denies orthopnea, PND, and syncope. Feels much better and having no difficulty sleeping. PHYSICAL EXAM BP 140/50  Pulse 51  Ht 5\' 11"  (1.803 m)  Wt 203 lb (92.08 kg)  BMI 28.33 kg/m2 No JVD 2/6 decrescendo murmur of aortic regurgitation. Rhythm is mildly irregular. Chest is clear No significant edema. There is trace ankle edema. Neuro is intact.  EKG: Normal sinus rhythm with right axis deviation and nonspecific intraventricular conduction delay  ASSESSMENT AND PLAN  1. Atrial flutter has not recurred. He is  status post atrial flutter ablation by Dr. Johney Frame. I will let him go another 4-8 weeks before discontinuing amiodarone.  2. Bioprosthetic aortic valve with mild-to-moderate aortic regurgitation, perivalvular.  3. Acute diastolic heart failure resolved to chronic diastolic heart failure.  4. Chronic kidney disease stage III.  5. Chronic anticoagulation therapy, Coumadin.  6. Chronic anemia, iron deficiency  Overall, he is improved with significant increase in exertional tolerance. He is undergoing an anemia workup. This will be performed to Dr. Laural Benes at [place]. I'll plan to see the patient back in 6 weeks at which time we will consider discontinuing amiodarone therapy. We need to get followup of a sleep study to determine what equipment he needs to begin therapy.

## 2013-06-19 NOTE — Patient Instructions (Signed)
Your physician recommends that you continue on your current medications as directed. Please refer to the Current Medication list given to you today.  Your physician recommends that you schedule a follow-up appointment in: 6 weeks with Dr.Smith ( to discuss discontinuing amiodarone)

## 2013-06-21 ENCOUNTER — Other Ambulatory Visit (INDEPENDENT_AMBULATORY_CARE_PROVIDER_SITE_OTHER): Payer: Medicare Other

## 2013-06-21 DIAGNOSIS — I5030 Unspecified diastolic (congestive) heart failure: Secondary | ICD-10-CM

## 2013-06-21 LAB — BASIC METABOLIC PANEL
BUN: 19 mg/dL (ref 6–23)
CO2: 30 mEq/L (ref 19–32)
Calcium: 8.7 mg/dL (ref 8.4–10.5)
Chloride: 98 mEq/L (ref 96–112)
Creatinine, Ser: 1.4 mg/dL (ref 0.4–1.5)
GFR: 62.22 mL/min (ref >60.00)
Sodium: 136 mEq/L (ref 135–145)

## 2013-06-26 ENCOUNTER — Ambulatory Visit: Payer: Medicare Other | Admitting: Interventional Cardiology

## 2013-06-27 ENCOUNTER — Telehealth: Payer: Self-pay | Admitting: Interventional Cardiology

## 2013-06-27 NOTE — Telephone Encounter (Signed)
Notified wife that Dr. Katrinka Blazing felt like iron didn't need to be rechecked because Hemoglobin was stable.  While talking with her she wanted to know if they should proceed with test that Dr. Laural Benes wanted to do-endoscopy and colonoscopy.  Spoke w/Dr. Katrinka Blazing again and he advised to proceed to determine the cause of his anemia.  She verbalizes understanding and will get in touch with Dr. Laural Benes to schedule.

## 2013-06-27 NOTE — Telephone Encounter (Signed)
FYI    Pt's wife called asking for Synetta Fail.  Tried to help she hung up.

## 2013-06-28 ENCOUNTER — Ambulatory Visit (INDEPENDENT_AMBULATORY_CARE_PROVIDER_SITE_OTHER): Payer: Medicare Other | Admitting: Pharmacist

## 2013-06-28 DIAGNOSIS — I4892 Unspecified atrial flutter: Secondary | ICD-10-CM

## 2013-06-28 DIAGNOSIS — Z7901 Long term (current) use of anticoagulants: Secondary | ICD-10-CM

## 2013-06-28 LAB — POCT INR: INR: 2.8

## 2013-06-29 ENCOUNTER — Telehealth: Payer: Self-pay

## 2013-07-03 NOTE — Telephone Encounter (Signed)
error 

## 2013-07-04 ENCOUNTER — Telehealth: Payer: Self-pay | Admitting: Pharmacist

## 2013-07-04 NOTE — Telephone Encounter (Signed)
Agree with the plan.

## 2013-07-04 NOTE — Telephone Encounter (Signed)
Katrina from Dr. Daphine Deutscher Johnson's office called to let us know that patient is getting set up for an endoscopy / colonoscopy on 07/17/13 for iron deficiency anemia.  They wanted to know if it was okay to hold warfarin prior to procedure, and if so, for how long.  I advised Dr. Henriette Combs office that patient would need to hold warfarin 5 days prior to procedure, and then the restart date would depend on if any intervention needed.  Patient's wife also called me while creating this note, and I advised her to hold warfarin 5 days prior to procedure, then restart previous warfarin after procedure when Dr. Laural Benes tells them it's okay.   She showed verbal understanding of plan.  Dr. Katrinka Blazing, do you have any objections of patient holding warfarin 5 days prior to endoscopy/colonoscopy?

## 2013-07-05 ENCOUNTER — Other Ambulatory Visit: Payer: Self-pay | Admitting: Gastroenterology

## 2013-07-05 ENCOUNTER — Encounter (HOSPITAL_COMMUNITY): Payer: Self-pay | Admitting: *Deleted

## 2013-07-05 ENCOUNTER — Encounter (HOSPITAL_COMMUNITY): Payer: Self-pay | Admitting: Pharmacy Technician

## 2013-07-05 HISTORY — PX: ATRIAL FLUTTER ABLATION: SHX5733

## 2013-07-11 ENCOUNTER — Telehealth: Payer: Self-pay | Admitting: Interventional Cardiology

## 2013-07-11 ENCOUNTER — Other Ambulatory Visit: Payer: Self-pay

## 2013-07-11 MED ORDER — FUROSEMIDE 80 MG PO TABS: ORAL_TABLET | ORAL | Status: DC

## 2013-07-17 ENCOUNTER — Encounter (HOSPITAL_COMMUNITY): Payer: Self-pay | Admitting: Anesthesiology

## 2013-07-17 ENCOUNTER — Encounter (HOSPITAL_COMMUNITY)
Admission: RE | Disposition: A | Discharge status: Home / Self Care | Payer: Self-pay | Source: Ambulatory Visit | Attending: Gastroenterology

## 2013-07-17 ENCOUNTER — Ambulatory Visit (HOSPITAL_COMMUNITY)
Admission: RE | Admit: 2013-07-17 | Discharge: 2013-07-17 | Disposition: A | Discharge status: Home / Self Care | Payer: Medicare Other | Source: Ambulatory Visit | Attending: Gastroenterology | Admitting: Gastroenterology

## 2013-07-17 ENCOUNTER — Encounter (HOSPITAL_COMMUNITY): Payer: Medicare Other | Admitting: Anesthesiology

## 2013-07-17 ENCOUNTER — Ambulatory Visit (HOSPITAL_COMMUNITY): Payer: Medicare Other | Admitting: Anesthesiology

## 2013-07-17 DIAGNOSIS — Z8674 Personal history of sudden cardiac arrest: Secondary | ICD-10-CM | POA: Insufficient documentation

## 2013-07-17 DIAGNOSIS — G4733 Obstructive sleep apnea (adult) (pediatric): Secondary | ICD-10-CM | POA: Insufficient documentation

## 2013-07-17 DIAGNOSIS — D509 Iron deficiency anemia, unspecified: Secondary | ICD-10-CM | POA: Insufficient documentation

## 2013-07-17 DIAGNOSIS — Z87891 Personal history of nicotine dependence: Secondary | ICD-10-CM | POA: Insufficient documentation

## 2013-07-17 DIAGNOSIS — Z951 Presence of aortocoronary bypass graft: Secondary | ICD-10-CM | POA: Insufficient documentation

## 2013-07-17 DIAGNOSIS — D128 Benign neoplasm of rectum: Secondary | ICD-10-CM | POA: Insufficient documentation

## 2013-07-17 DIAGNOSIS — I5030 Unspecified diastolic (congestive) heart failure: Secondary | ICD-10-CM | POA: Insufficient documentation

## 2013-07-17 DIAGNOSIS — I129 Hypertensive chronic kidney disease with stage 1 through stage 4 chronic kidney disease, or unspecified chronic kidney disease: Secondary | ICD-10-CM | POA: Insufficient documentation

## 2013-07-17 DIAGNOSIS — I6529 Occlusion and stenosis of unspecified carotid artery: Secondary | ICD-10-CM | POA: Insufficient documentation

## 2013-07-17 DIAGNOSIS — I739 Peripheral vascular disease, unspecified: Secondary | ICD-10-CM | POA: Insufficient documentation

## 2013-07-17 DIAGNOSIS — Z954 Presence of other heart-valve replacement: Secondary | ICD-10-CM | POA: Insufficient documentation

## 2013-07-17 DIAGNOSIS — I4892 Unspecified atrial flutter: Secondary | ICD-10-CM | POA: Insufficient documentation

## 2013-07-17 DIAGNOSIS — I2510 Atherosclerotic heart disease of native coronary artery without angina pectoris: Secondary | ICD-10-CM | POA: Insufficient documentation

## 2013-07-17 DIAGNOSIS — N183 Chronic kidney disease, stage 3 unspecified: Secondary | ICD-10-CM | POA: Insufficient documentation

## 2013-07-17 HISTORY — PX: ESOPHAGOGASTRODUODENOSCOPY (EGD) WITH PROPOFOL: SHX5813

## 2013-07-17 HISTORY — PX: COLONOSCOPY WITH PROPOFOL: SHX5780

## 2013-07-17 LAB — HEMOGLOBIN AND HEMATOCRIT, BLOOD
HCT: 35.3 % — ABNORMAL LOW (ref 39.0–52.0)
Hemoglobin: 11.2 g/dL — ABNORMAL LOW (ref 13.0–17.0)

## 2013-07-17 SURGERY — COLONOSCOPY WITH PROPOFOL
Anesthesia: Monitor Anesthesia Care

## 2013-07-17 MED ORDER — PROPOFOL INFUSION 10 MG/ML OPTIME
INTRAVENOUS | Status: DC | PRN
  Administered 2013-07-17: 200 ug/kg/min via INTRAVENOUS

## 2013-07-17 MED ORDER — LIDOCAINE HCL (CARDIAC) 20 MG/ML IV SOLN
INTRAVENOUS | Status: DC | PRN
  Administered 2013-07-17: 50 mg via INTRAVENOUS

## 2013-07-17 MED ORDER — PROPOFOL 10 MG/ML IV BOLUS
INTRAVENOUS | Status: DC | PRN
  Administered 2013-07-17: 100 mg via INTRAVENOUS

## 2013-07-17 MED ORDER — LACTATED RINGERS IV SOLN
INTRAVENOUS | Status: DC
  Administered 2013-07-17: 11:00:00 via INTRAVENOUS
  Administered 2013-07-17: 1000 mL via INTRAVENOUS

## 2013-07-17 MED ORDER — BUTAMBEN-TETRACAINE-BENZOCAINE 2-2-14 % EX AERO
INHALATION_SPRAY | CUTANEOUS | Status: DC | PRN
  Administered 2013-07-17: 2 via TOPICAL

## 2013-07-17 SURGICAL SUPPLY — 24 items

## 2013-07-17 NOTE — Op Note (Signed)
Problem: Unexplained iron deficiency anemia  Endoscopist: Danise Edge  Premedication: Propofol administered by anesthesia  Procedure: Diagnostic esophagogastroduodenoscopy The patient was placed in the left lateral decubitus position. The Pentax gastroscope was passed through the posterior hypopharynx into the proximal esophagus without difficulty. The hypopharynx, larynx, and vocal cords appeared normal.  Esophagoscopy: The proximal, mid, and lower segments of the esophageal mucosa appear normal. The squamocolumnar junction is regular and noted at 45 cm from the incisor teeth.  Gastroscopy: Retroflex view of the gastric cardia and fundus was normal. The gastric body, antrum, and pylorus appeared normal.  Duodenoscopy: The duodenal bulb and descending duodenum appeared normal.  Assessment: Normal esophagogastroduodenoscopy  Procedure: Diagnostic colonoscopy Anal inspection and digital rectal exam were normal. The Pentax pediatric colonoscope was introduced into the rectum and advanced to the cecum. A normal-appearing appendiceal orifice and ileocecal valve were identified. Colonic preparation for the exam today was good.  Rectum. From the mid rectum, a 7 mm pedunculated polyp was removed with the electrocautery snare and submitted for pathological interpretation retroflex view of the distal rectum was normal.  Sigmoid colon and descending colon. Normal  Splenic flexure. Normal  Transverse colon. Normal  Hepatic flexure. Normal  Ascending colon. Normal  Cecum and ileocecal valve. Normal  Assessment:  #1. A 7 mm pedunculated polyp was removed from the mid rectum with the electrocautery snare  #2. Otherwise normal diagnostic proctocolonoscopy to the cecum  Recommendations: If rectal polyp returns adenomatous pathologically, the patient should undergo a surveillance colonoscopy in 5 years. If the rectal polyp returns nonneoplastic pathologically, the patient should undergo a  repeat screening colonoscopy in approximately 10 years.

## 2013-07-17 NOTE — H&P (Signed)
  Problem: Iron deficiency anemia  History: The patient is a 72 year old male born [birth year]. The patient was admitted to the hospital on 05/09/2013 to evaluate and treat acute onset atrial flutter complicated by acute heart failure. On 05/10/2013, the patient developed ventricular fibrillation requiring CPR and defibrillation. On 05/13/2013, the patient underwent successful right atrial isthmus ablation to treat atrial flutter. An echocardiogram demonstrated normal systolic function. The patient was discharged from the hospital on 05/18/2013 in improved medical condition. During his hospitalization he was diagnosed with iron deficiency anemia. The patient refused blood transfusion.  The patient has never undergone a colonoscopy in the past. He denies gastrointestinal bleeding, hematuria, or hemoptysis.  The patient is scheduled to undergo diagnostic esophagogastroduodenoscopy and colonoscopy today. He stop taking Coumadin approximately 5 days ago.  Past medical history: Hypertension. Aortic valve replacement with bioprosthesis. Coronary artery bypass grafting. Diastolic heart failure. Peripheral artery disease. Carotid artery disease. Recurrent atrial flutter.  Medication allergies: None  Exam: The patient is alert and lying comfortably on the endoscopy stretcher. Abdomen is soft and nontender to palpation. Cardiac exam reveals a regular rhythm. Lungs are clear to auscultation  Plan: Proceed with diagnostic esophagogastroduodenoscopy and colonoscopy to evaluate iron deficiency anemia.

## 2013-07-17 NOTE — Anesthesia Preprocedure Evaluation (Addendum)
Anesthesia Evaluation  Patient identified by MRN, date of birth, ID band Patient awake    Reviewed: Allergy & Precautions, H&P , NPO status , Patient's Chart, lab work & pertinent test results  Airway Mallampati: II TM Distance: >3 FB Neck ROM: full    Dental no notable dental hx. (+) Edentulous Upper, Missing and Dental Advisory Given Half lower front missing:   Pulmonary sleep apnea , former smoker,  Severe OSA NO cpap.  History acute respiratory failure. breath sounds clear to auscultation  Pulmonary exam normal       Cardiovascular hypertension, + CAD, + CABG and +CHF + dysrhythmias Atrial Fibrillation and Ventricular Tachycardia Rhythm:regular Rate:Normal  HISTORY AF ablation 10/14. AVR.  Acute diastolic heart failure.  History cardiac arrest.   Neuro/Psych negative neurological ROS  negative psych ROS   GI/Hepatic negative GI ROS, Neg liver ROS,   Endo/Other  negative endocrine ROS  Renal/GU Renal diseaseStage 3 chronic renal disease  negative genitourinary   Musculoskeletal   Abdominal   Peds  Hematology negative hematology ROS (+)   Anesthesia Other Findings   Reproductive/Obstetrics negative OB ROS                          Anesthesia Physical Anesthesia Plan  ASA: IV  Anesthesia Plan: MAC   Post-op Pain Management:    Induction:   Airway Management Planned: Simple Face Mask  Additional Equipment:   Intra-op Plan:   Post-operative Plan:   Informed Consent: I have reviewed the patients History and Physical, chart, labs and discussed the procedure including the risks, benefits and alternatives for the proposed anesthesia with the patient or authorized representative who has indicated his/her understanding and acceptance.   Dental Advisory Given  Plan Discussed with: CRNA and Surgeon  Anesthesia Plan Comments:         Anesthesia Quick Evaluation

## 2013-07-17 NOTE — Preoperative (Signed)
Beta Blockers   Reason not to administer Beta Blockers:Not Applicable 

## 2013-07-17 NOTE — Anesthesia Postprocedure Evaluation (Signed)
  Anesthesia Post-op Note  Patient: [name] [name]  Procedure(s) Performed: Procedure(s) (LRB): COLONOSCOPY WITH PROPOFOL (N/A) ESOPHAGOGASTRODUODENOSCOPY (EGD) WITH PROPOFOL (N/A)  Patient Location: PACU  Anesthesia Type: MAC  Level of Consciousness: awake and alert   Airway and Oxygen Therapy: Patient Spontanous Breathing  Post-op Pain: mild  Post-op Assessment: Post-op Vital signs reviewed, Patient's Cardiovascular Status Stable, Respiratory Function Stable, Patent Airway and No signs of Nausea or vomiting  Last Vitals:  Filed Vitals:   07/17/13 1107  BP: 139/63  Pulse:   Temp:   Resp: 14    Post-op Vital Signs: stable   Complications: No apparent anesthesia complications

## 2013-07-17 NOTE — Transfer of Care (Signed)
Immediate Anesthesia Transfer of Care Note  Patient: [name] [name]  Procedure(s) Performed: Procedure(s): COLONOSCOPY WITH PROPOFOL (N/A) ESOPHAGOGASTRODUODENOSCOPY (EGD) WITH PROPOFOL (N/A)  Patient Location: PACU and Endoscopy Unit  Anesthesia Type:MAC  Level of Consciousness: awake, patient cooperative, lethargic and responds to stimulation  Airway & Oxygen Therapy: Patient Spontanous Breathing and Patient connected to nasal cannula oxygen  Post-op Assessment: Report given to PACU RN, Post -op Vital signs reviewed and stable and Patient moving all extremities  Post vital signs: Reviewed and stable  Complications: No apparent anesthesia complications

## 2013-07-18 ENCOUNTER — Encounter (HOSPITAL_COMMUNITY): Payer: Self-pay | Admitting: Gastroenterology

## 2013-08-07 ENCOUNTER — Ambulatory Visit (INDEPENDENT_AMBULATORY_CARE_PROVIDER_SITE_OTHER): Payer: Medicare Other | Admitting: Interventional Cardiology

## 2013-08-07 ENCOUNTER — Ambulatory Visit (INDEPENDENT_AMBULATORY_CARE_PROVIDER_SITE_OTHER): Payer: Medicare Other | Admitting: Pharmacist

## 2013-08-07 ENCOUNTER — Encounter: Payer: Self-pay | Admitting: Interventional Cardiology

## 2013-08-07 VITALS — BP 145/59 | HR 61 | Ht 71.0 in | Wt 212.0 lb

## 2013-08-07 DIAGNOSIS — Z7901 Long term (current) use of anticoagulants: Secondary | ICD-10-CM

## 2013-08-07 DIAGNOSIS — I4892 Unspecified atrial flutter: Secondary | ICD-10-CM

## 2013-08-07 DIAGNOSIS — D509 Iron deficiency anemia, unspecified: Secondary | ICD-10-CM

## 2013-08-07 DIAGNOSIS — Z79899 Other long term (current) drug therapy: Secondary | ICD-10-CM | POA: Insufficient documentation

## 2013-08-07 DIAGNOSIS — Z952 Presence of prosthetic heart valve: Secondary | ICD-10-CM

## 2013-08-07 DIAGNOSIS — Z953 Presence of xenogenic heart valve: Secondary | ICD-10-CM

## 2013-08-07 DIAGNOSIS — I2510 Atherosclerotic heart disease of native coronary artery without angina pectoris: Secondary | ICD-10-CM

## 2013-08-07 DIAGNOSIS — I5032 Chronic diastolic (congestive) heart failure: Secondary | ICD-10-CM

## 2013-08-07 LAB — CBC WITH DIFFERENTIAL/PLATELET
Basophils Absolute: 0.1 10*3/uL (ref 0.0–0.1)
Eosinophils Absolute: 0.4 10*3/uL (ref 0.0–0.7)
Eosinophils Relative: 4.4 % (ref 0.0–5.0)
Lymphs Abs: 2.5 10*3/uL (ref 0.7–4.0)
MCHC: 32.7 g/dL (ref 30.0–36.0)
MCV: 86 fl (ref 78.0–100.0)
Monocytes Absolute: 0.6 10*3/uL (ref 0.1–1.0)
Neutrophils Relative %: 58.4 % (ref 43.0–77.0)
Platelets: 242 10*3/uL (ref 150.0–400.0)
WBC: 8.5 10*3/uL (ref 4.5–10.5)

## 2013-08-07 LAB — HEPATIC FUNCTION PANEL
Alkaline Phosphatase: 79 U/L (ref 39–117)
Bilirubin, Direct: 0 mg/dL (ref 0.0–0.3)
Total Bilirubin: 0.6 mg/dL (ref 0.3–1.2)

## 2013-08-07 LAB — BASIC METABOLIC PANEL
GFR: 66.44 mL/min (ref >60.00)
Potassium: 4.4 mEq/L (ref 3.5–5.1)
Sodium: 136 mEq/L (ref 135–145)

## 2013-08-07 LAB — TSH: TSH: 0.87 u[IU]/mL (ref 0.35–5.50)

## 2013-08-07 NOTE — Patient Instructions (Signed)
Your physician recommends that you continue on your current medications as directed. Please refer to the Current Medication list given to you today.  Labs today: Cbc, Bmet  Your physician recommends that you schedule a follow-up appointment on: 11/06/13 @ 9:45am   Your physician recommends that you return for lab work in: 3 months Tsh, Hepatic, Chest xray

## 2013-08-07 NOTE — Progress Notes (Signed)
Patient ID: [name] [name], male   DOB: 24-Jun-1941, 72 y.o.   MRN: [id number]    [address] [place], [place]  [address] Phone: [phone] Fax:  [phone]  Date:  08/07/2013   ID:  [name] [name], DOB [birth year], MRN [id number]  PCP:  Lesleigh Noe, MD   ASSESSMENT:  1. Aortic valve replacement with moderate regurgitation, stable 2. Chronic diastolic heart failure, stable 3. Chronic kidney disease, stage III, status unknown 4. Iron deficiency anemia with recent GI workup negative for obvious bleeding points 5. Chronic Coumadin anticoagulation therapy, no bleeding 6. Amiodarone therapy without evidence of toxicity  PLAN:  1. CBC and BMET today 2. Clinical followup in 3 months 3. Discussed amiodarone therapy and duration, still likely in definite. 4. On return we will need to get a chest x-ray hepatic panel and TSH   SUBJECTIVE: [name] [name] is a 72 y.o. male who has improved since his recent hospital stay with atrial arrhythmias and heart failure. Severe anemia was found to be related tired deficiency. He is now had both upper and lower GI endoscopy without finding a specific bleeding site. He has gained weight. He has no dyspnea orthopnea. Exertional tolerance is markedly improved. He denies chills and fever. No edema. He denies angina. There is no evidence of melena or hematochezia   Wt Readings from Last 3 Encounters:  08/07/13 212 lb (96.163 kg)  07/17/13 195 lb (88.451 kg)  07/17/13 195 lb (88.451 kg)     Past Medical History  Diagnosis Date  . Coronary artery disease   . Angina   . Hypertension   . Atrial flutter   . Obstructive sleep apnea     severe,states had test, no machine, used oxygen in past, not now    Current Outpatient Prescriptions  Medication Sig Dispense Refill  . amiodarone (PACERONE) 200 MG tablet Take 200 mg by mouth at bedtime.      Marland Kitchen amLODipine (NORVASC) 5 MG tablet Take 5 mg by mouth daily.      Marland Kitchen amoxicillin  (AMOXIL) 500 MG capsule Take 2,000 mg by mouth once as needed (take 1 hour prior to dental appointment).      . CALCIUM PO Take by mouth as directed.      . ferrous sulfate 325 (65 FE) MG tablet Take 1 tablet (325 mg total) by mouth 3 (three) times daily with meals.  90 tablet  3  . furosemide (LASIX) 80 MG tablet Take 1 1/2 tab twice a day      . Glucosamine HCl (GLUCOSAMINE PO) Take 1 tablet by mouth daily. Joint Support      . Multiple Vitamins-Minerals (MULTIVITAMIN PO) Take 2-3 tablets by mouth daily.      Marland Kitchen omega-3 acid ethyl esters (LOVAZA) 1 G capsule Take 1 g by mouth 3 (three) times daily.      Marland Kitchen OVER THE COUNTER MEDICATION Take 1 tablet by mouth daily as needed (when ever he thinks he needs it). Ultimate prostate      . OVER THE COUNTER MEDICATION Take 1 tablet by mouth daily as needed (when ever he thinks he needs it). Male factor/Prelox Blue      . OVER THE COUNTER MEDICATION Take 1 tablet by mouth daily as needed (when ever he thinks he needs it). Schizandra/RoseGuard      . OVER THE COUNTER MEDICATION Take 1 tablet by mouth daily as needed (when ever he thinks he needs it). Snack Defense      .  OVER THE COUNTER MEDICATION Take 1 tablet by mouth daily as needed (when ever he thinks he needs it). Niteworks      . potassium chloride SA (K-DUR,KLOR-CON) 20 MEQ tablet Take 20 mEq by mouth daily.       . rosuvastatin (CRESTOR) 5 MG tablet Take 5 mg by mouth at bedtime.       Marland Kitchen warfarin (COUMADIN) 5 MG tablet Take 2.5-5 mg by mouth at bedtime. Takes 1 tablet (5mg ) on Mondays and Fridays and a half tablet (2.5 mg) all other days       No current facility-administered medications for this visit.    Allergies:   No Known Allergies  Social History:  The patient  reports that he has quit smoking. His smoking use included Cigarettes. He has a 2.5 pack-year smoking history. He quit smokeless tobacco use about 34 years ago. He reports that he drinks about 1.2 ounces of alcohol per week. He  reports that he does not use illicit drugs.   ROS:  Please see the history of present illness.   Denies blood in urine or stool. No transient neurological complaints. Denies chest pain.   All other systems reviewed and negative.   OBJECTIVE: VS:  BP 145/59  Pulse 61  Ht 5\' 11"  (1.803 m)  Wt 212 lb (96.163 kg)  BMI 29.58 kg/m2 Well nourished, well developed, in no acute distress, healthy-appearing HEENT: normal Neck: JVD flat. Carotid bruit 2+ transmitted  Cardiac:  normal S1, S2; RRR; no murmur Lungs:  clear to auscultation bilaterally, no wheezing, rhonchi or rales Abd: soft, nontender, no hepatomegaly Ext: Edema absent. Pulses bilateral 2+  Skin: warm and dry Neuro:  CNs 2-12 intact, no focal abnormalities noted  EKG:  Not performed       Signed, Darci Needle III, MD 08/07/2013 9:41 AM

## 2013-08-08 ENCOUNTER — Telehealth: Payer: Self-pay

## 2013-08-08 NOTE — Telephone Encounter (Signed)
pt wife notifeid of labs.  All labs are looking better. Hemoglobin is now 11.4. Kidney function is normal. Liver function is normal. Potassium is normal .pt wife verbalized understaning.

## 2013-08-08 NOTE — Telephone Encounter (Signed)
Message copied by Jarvis Newcomer on Wed Aug 08, 2013 12:23 PM ------      Message from: Verdis Prime      Created: Tue Aug 07, 2013  5:27 PM       All labs are looking better. Hemoglobin is now 11.4. Kidney function is normal. Liver function is normal. Potassium is normal. ------

## 2013-08-08 NOTE — Telephone Encounter (Signed)
Message copied by Jarvis Newcomer on Wed Aug 08, 2013 12:35 PM ------      Message from: Verdis Prime      Created: Tue Aug 07, 2013  5:27 PM       All labs are looking better. Hemoglobin is now 11.4. Kidney function is normal. Liver function is normal. Potassium is normal. ------

## 2013-08-13 ENCOUNTER — Ambulatory Visit: Payer: Medicare Other | Admitting: Cardiology

## 2013-08-13 ENCOUNTER — Telehealth: Payer: Self-pay | Admitting: *Deleted

## 2013-08-13 NOTE — Telephone Encounter (Signed)
Dr Katrinka Blazing, can you please clarify lasix dose for patient. He was dc'd from hospital on 05/09/13 taking lasix 80mg  take 1.5 tablets qd. Somehow between then and 08/07/13 office visit it was changed to lasix 80mg  take 1.5 tablets bid. Please advise. Thanks, MI

## 2013-08-14 ENCOUNTER — Telehealth: Payer: Self-pay | Admitting: Interventional Cardiology

## 2013-08-14 MED ORDER — FUROSEMIDE 80 MG PO TABS: 120.0000 mg | ORAL_TABLET | Freq: Two times a day (BID) | ORAL | Status: DC

## 2013-08-14 NOTE — Telephone Encounter (Signed)
returned pt call.rx sent to pt pharmacy lasix 120mg  bid.pt wife verbalized understanding.

## 2013-08-14 NOTE — Telephone Encounter (Signed)
New message   There is a confusion regarding patients mediation, was given the wrong meds at pharmacy. Patient is completley out of meds, she would like for you to call and advise.

## 2013-08-15 ENCOUNTER — Other Ambulatory Visit: Payer: Self-pay | Admitting: *Deleted

## 2013-08-15 MED ORDER — AMIODARONE HCL 200 MG PO TABS: 200.0000 mg | ORAL_TABLET | Freq: Every day | ORAL | Status: DC

## 2013-08-15 NOTE — Telephone Encounter (Signed)
ERROR

## 2013-08-16 NOTE — Telephone Encounter (Signed)
Please keep patient on what ever he is currently taking. I think it may be 120 BID. Ask him and updayte chart

## 2013-08-17 ENCOUNTER — Telehealth: Payer: Self-pay

## 2013-08-21 ENCOUNTER — Ambulatory Visit (INDEPENDENT_AMBULATORY_CARE_PROVIDER_SITE_OTHER): Payer: Medicare Other | Admitting: *Deleted

## 2013-08-21 DIAGNOSIS — I4892 Unspecified atrial flutter: Secondary | ICD-10-CM

## 2013-08-21 DIAGNOSIS — Z7901 Long term (current) use of anticoagulants: Secondary | ICD-10-CM

## 2013-09-03 NOTE — Telephone Encounter (Signed)
error 

## 2013-09-04 ENCOUNTER — Ambulatory Visit (INDEPENDENT_AMBULATORY_CARE_PROVIDER_SITE_OTHER): Payer: Medicare Other | Admitting: Pharmacist

## 2013-09-04 ENCOUNTER — Other Ambulatory Visit: Payer: Self-pay

## 2013-09-04 DIAGNOSIS — I4892 Unspecified atrial flutter: Secondary | ICD-10-CM

## 2013-09-04 DIAGNOSIS — Z7901 Long term (current) use of anticoagulants: Secondary | ICD-10-CM

## 2013-09-04 DIAGNOSIS — Z5181 Encounter for therapeutic drug level monitoring: Secondary | ICD-10-CM

## 2013-09-05 ENCOUNTER — Other Ambulatory Visit: Payer: Self-pay | Admitting: *Deleted

## 2013-09-05 MED ORDER — AMLODIPINE BESYLATE 5 MG PO TABS: 5.0000 mg | ORAL_TABLET | Freq: Every day | ORAL | Status: DC

## 2013-09-24 ENCOUNTER — Ambulatory Visit (INDEPENDENT_AMBULATORY_CARE_PROVIDER_SITE_OTHER): Payer: Medicare Other | Admitting: Pharmacist

## 2013-09-24 DIAGNOSIS — Z7901 Long term (current) use of anticoagulants: Secondary | ICD-10-CM

## 2013-09-24 DIAGNOSIS — Z5181 Encounter for therapeutic drug level monitoring: Secondary | ICD-10-CM

## 2013-09-24 DIAGNOSIS — I4892 Unspecified atrial flutter: Secondary | ICD-10-CM

## 2013-09-24 LAB — POCT INR: INR: 2.3

## 2013-10-08 ENCOUNTER — Other Ambulatory Visit: Payer: Self-pay

## 2013-10-08 MED ORDER — FERROUS SULFATE 325 (65 FE) MG PO TABS: 325.0000 mg | ORAL_TABLET | Freq: Three times a day (TID) | ORAL | Status: DC

## 2013-10-17 ENCOUNTER — Ambulatory Visit (INDEPENDENT_AMBULATORY_CARE_PROVIDER_SITE_OTHER): Payer: Medicare Other | Admitting: Pharmacist

## 2013-10-17 DIAGNOSIS — Z7901 Long term (current) use of anticoagulants: Secondary | ICD-10-CM

## 2013-10-17 DIAGNOSIS — I4892 Unspecified atrial flutter: Secondary | ICD-10-CM

## 2013-10-17 LAB — POCT INR: INR: 1.5

## 2013-10-29 ENCOUNTER — Ambulatory Visit (INDEPENDENT_AMBULATORY_CARE_PROVIDER_SITE_OTHER): Payer: Medicare Other | Admitting: Pharmacist

## 2013-10-29 DIAGNOSIS — Z7901 Long term (current) use of anticoagulants: Secondary | ICD-10-CM

## 2013-10-29 DIAGNOSIS — I4892 Unspecified atrial flutter: Secondary | ICD-10-CM

## 2013-10-29 LAB — POCT INR: INR: 3

## 2013-11-06 ENCOUNTER — Ambulatory Visit (INDEPENDENT_AMBULATORY_CARE_PROVIDER_SITE_OTHER): Payer: Medicare Other | Admitting: Pharmacist

## 2013-11-06 ENCOUNTER — Encounter: Payer: Self-pay | Admitting: Interventional Cardiology

## 2013-11-06 ENCOUNTER — Ambulatory Visit (INDEPENDENT_AMBULATORY_CARE_PROVIDER_SITE_OTHER): Payer: Medicare Other | Admitting: Interventional Cardiology

## 2013-11-06 VITALS — BP 142/50 | HR 60 | Ht 71.0 in | Wt 220.1 lb

## 2013-11-06 DIAGNOSIS — I4892 Unspecified atrial flutter: Secondary | ICD-10-CM

## 2013-11-06 DIAGNOSIS — I97190 Other postprocedural cardiac functional disturbances following cardiac surgery: Secondary | ICD-10-CM

## 2013-11-06 DIAGNOSIS — N183 Chronic kidney disease, stage 3 unspecified: Secondary | ICD-10-CM

## 2013-11-06 DIAGNOSIS — I2510 Atherosclerotic heart disease of native coronary artery without angina pectoris: Secondary | ICD-10-CM

## 2013-11-06 DIAGNOSIS — T8209XA Other mechanical complication of heart valve prosthesis, initial encounter: Secondary | ICD-10-CM

## 2013-11-06 DIAGNOSIS — D509 Iron deficiency anemia, unspecified: Secondary | ICD-10-CM

## 2013-11-06 DIAGNOSIS — I5032 Chronic diastolic (congestive) heart failure: Secondary | ICD-10-CM

## 2013-11-06 DIAGNOSIS — Z7901 Long term (current) use of anticoagulants: Secondary | ICD-10-CM

## 2013-11-06 DIAGNOSIS — Z79899 Other long term (current) drug therapy: Secondary | ICD-10-CM

## 2013-11-06 LAB — POCT INR: INR: 2.3

## 2013-11-06 NOTE — Patient Instructions (Signed)
Your physician recommends that you continue on your current medications as directed. Please refer to the Current Medication list given to you today.  Your physician wants you to follow-up in: 4 months You will receive a reminder letter in the mail two months in advance. If you don't receive a letter, please call our office to schedule the follow-up appointment.  Your physician recommends that you return for lab work in: 4 months tsh,hepatic, and chest xray

## 2013-11-06 NOTE — Progress Notes (Signed)
Patient ID: [name] [name], male   DOB: [birth year], 73 y.o.   MRN: [id number]    [address], [address], [place]  [address] Phone: [phone] Fax:  [phone]  Date:  11/06/2013   ID:  [name] [name], DOB 02-19-1941, MRN [id number]  PCP:  Irven Shelling, MD   ASSESSMENT:  1. Chronic diastolic heart failure 2. History of atrial fibrillation and flutter 3. Chronic amiodarone therapy 4. Hypertension 5. CAD 6. Prosthetic aortic valve  PLAN:  1. TSH, hepatic panel, PA lateral chest x-ray on return in 4 months. 2. No change in medical regimen 3. Cleared for upcoming lens implant   SUBJECTIVE: [name] [name] is a 73 y.o. male who is doing well. He has gained some weight since his last office visit but is not short of breath and has no edema. No palpitations have occurred. Claudication is improving/stable. He is to have an upcoming lens implant. He denies blood in stool. He has not had chest pain. There is no orthopnea or PND.   Wt Readings from Last 3 Encounters:  11/06/13 220 lb 1.9 oz (99.846 kg)  08/07/13 212 lb (96.163 kg)  07/17/13 195 lb (88.451 kg)     Past Medical History  Diagnosis Date  . Coronary artery disease   . Angina   . Hypertension   . Atrial flutter   . Obstructive sleep apnea     severe,states had test, no machine, used oxygen in past, not now    Current Outpatient Prescriptions  Medication Sig Dispense Refill  . amiodarone (PACERONE) 200 MG tablet Take 1 tablet (200 mg total) by mouth at bedtime.  30 tablet  3  . amLODipine (NORVASC) 5 MG tablet Take 1 tablet (5 mg total) by mouth daily.  30 tablet  2  . amoxicillin (AMOXIL) 500 MG capsule Take 2,000 mg by mouth once as needed (take 1 hour prior to dental appointment).      . CALCIUM PO Take by mouth as directed.      . ferrous sulfate 325 (65 FE) MG tablet Take 1 tablet (325 mg total) by mouth 3 (three) times daily with meals.  270 tablet  3  . furosemide (LASIX) 80 MG tablet  Take 1.5 tablets (120 mg total) by mouth 2 (two) times daily.  90 tablet  11  . Glucosamine HCl (GLUCOSAMINE PO) Take 1 tablet by mouth daily. Joint Support      . Multiple Vitamins-Minerals (MULTIVITAMIN PO) Take 2-3 tablets by mouth daily.      Marland Kitchen omega-3 acid ethyl esters (LOVAZA) 1 G capsule Take 1 g by mouth 3 (three) times daily.      Marland Kitchen OVER THE COUNTER MEDICATION Take 1 tablet by mouth daily as needed (when ever he thinks he needs it). Ultimate prostate      . OVER THE COUNTER MEDICATION Take 1 tablet by mouth daily as needed (when ever he thinks he needs it). Male factor/Prelox Blue      . OVER THE COUNTER MEDICATION Take 1 tablet by mouth daily as needed (when ever he thinks he needs it). Schizandra/RoseGuard      . OVER THE COUNTER MEDICATION Take 1 tablet by mouth daily as needed (when ever he thinks he needs it). Snack Defense      . OVER THE COUNTER MEDICATION Take 1 tablet by mouth daily as needed (when ever he thinks he needs it). Niteworks      . potassium chloride SA (K-DUR,KLOR-CON)  20 MEQ tablet Take 20 mEq by mouth daily.       . rosuvastatin (CRESTOR) 5 MG tablet Take 5 mg by mouth at bedtime.       Marland Kitchen warfarin (COUMADIN) 5 MG tablet Take 2.5-5 mg by mouth at bedtime. Takes 1 tablet (5mg ) on Mondays and Fridays and a half tablet (2.5 mg) all other days       No current facility-administered medications for this visit.    Allergies:   No Known Allergies  Social History:  The patient  reports that he quit smoking about 34 years ago. His smoking use included Cigarettes. He started smoking about 44 years ago. He has a 1.3 pack-year smoking history. He has never used smokeless tobacco. He reports that he drinks about 1.2 ounces of alcohol per week. He reports that he does not use illicit drugs.   ROS:  Please see the history of present illness.   Appetite is stable. He denies chills or fever. No exertional chest discomfort or dyspnea.   All other systems reviewed and negative.    OBJECTIVE: VS:  BP 142/50  Pulse 60  Ht 5\' 11"  (1.803 m)  Wt 220 lb 1.9 oz (99.846 kg)  BMI 30.71 kg/m2 Well nourished, well developed, in no acute distress, appears younger than stated age 1: normal Neck: JVD flat. Carotid bruit no bruits  Cardiac:  normal S1, S2; RRR; none  murmur Lungs:  clear to auscultation bilaterally, no wheezing, rhonchi or rales Abd: soft, nontender, no hepatomegaly Ext: Edema absent. Pulses 1+ bilateral Skin: warm and dry Neuro:  CNs 2-12 intact, no focal abnormalities noted  EKG:  Not repeated       Signed, Illene Labrador III, MD 11/06/2013 10:02 AM

## 2013-11-07 ENCOUNTER — Telehealth: Payer: Self-pay | Admitting: Pharmacist

## 2013-11-07 NOTE — Telephone Encounter (Signed)
Patient's wife called to inform us that patient's lens implant surgery by Dr. Baird Cancer with [facility]ist (Dr. Baird Cancer) on 11/19/13.  You just saw patient for clearance.  Dr. Baird Cancer told patient to hold warfarin 1 week prior to surgery.  Patient will hold warfarin 3/9 to 11/18/13, and resume usual dose after surgery.  Any objection to this Dr. Tamala Julian ?

## 2013-11-10 NOTE — Telephone Encounter (Signed)
This is ok

## 2013-11-14 ENCOUNTER — Telehealth: Payer: Self-pay | Admitting: Interventional Cardiology

## 2013-11-14 NOTE — Telephone Encounter (Signed)
returned pt wife call.adv her that was dx was based on past lab results.pt last bmet shows normal kidney function.pt wfe verbalized understanding.

## 2013-11-14 NOTE — Telephone Encounter (Signed)
New message          Pt wife has a question about the chronic kidney disease stage 3 that is coming up on his diagnostic report. Is this accurate?

## 2013-11-27 ENCOUNTER — Ambulatory Visit (INDEPENDENT_AMBULATORY_CARE_PROVIDER_SITE_OTHER): Payer: Medicare Other | Admitting: Pharmacist

## 2013-11-27 DIAGNOSIS — Z7901 Long term (current) use of anticoagulants: Secondary | ICD-10-CM

## 2013-11-27 DIAGNOSIS — I4892 Unspecified atrial flutter: Secondary | ICD-10-CM

## 2013-11-27 LAB — POCT INR: INR: 1.6

## 2013-11-29 ENCOUNTER — Other Ambulatory Visit: Payer: Self-pay | Admitting: Interventional Cardiology

## 2013-12-04 ENCOUNTER — Other Ambulatory Visit: Payer: Self-pay | Admitting: Interventional Cardiology

## 2013-12-07 ENCOUNTER — Ambulatory Visit (INDEPENDENT_AMBULATORY_CARE_PROVIDER_SITE_OTHER): Payer: Medicare Other | Admitting: Pharmacist

## 2013-12-07 ENCOUNTER — Other Ambulatory Visit: Payer: Self-pay

## 2013-12-07 DIAGNOSIS — I4892 Unspecified atrial flutter: Secondary | ICD-10-CM

## 2013-12-07 DIAGNOSIS — Z7901 Long term (current) use of anticoagulants: Secondary | ICD-10-CM

## 2013-12-07 LAB — POCT INR: INR: 2.2

## 2013-12-07 MED ORDER — POTASSIUM CHLORIDE CRYS ER 20 MEQ PO TBCR: 20.0000 meq | EXTENDED_RELEASE_TABLET | Freq: Every day | ORAL | Status: DC

## 2013-12-21 ENCOUNTER — Other Ambulatory Visit: Payer: Self-pay

## 2013-12-21 MED ORDER — AMIODARONE HCL 200 MG PO TABS: 200.0000 mg | ORAL_TABLET | Freq: Every day | ORAL | Status: DC

## 2014-01-03 NOTE — Telephone Encounter (Signed)
I do not have an open encounter for this patient.

## 2014-01-03 NOTE — Telephone Encounter (Signed)
error 

## 2014-01-09 ENCOUNTER — Ambulatory Visit (INDEPENDENT_AMBULATORY_CARE_PROVIDER_SITE_OTHER): Payer: Medicare Other | Admitting: *Deleted

## 2014-01-09 DIAGNOSIS — Z7901 Long term (current) use of anticoagulants: Secondary | ICD-10-CM

## 2014-01-09 DIAGNOSIS — I4892 Unspecified atrial flutter: Secondary | ICD-10-CM

## 2014-02-06 ENCOUNTER — Ambulatory Visit (INDEPENDENT_AMBULATORY_CARE_PROVIDER_SITE_OTHER): Payer: Medicare Other | Admitting: *Deleted

## 2014-02-06 DIAGNOSIS — I4892 Unspecified atrial flutter: Secondary | ICD-10-CM

## 2014-02-06 DIAGNOSIS — Z7901 Long term (current) use of anticoagulants: Secondary | ICD-10-CM

## 2014-02-06 LAB — POCT INR: INR: 2

## 2014-03-11 ENCOUNTER — Ambulatory Visit (INDEPENDENT_AMBULATORY_CARE_PROVIDER_SITE_OTHER): Payer: Medicare Other | Admitting: *Deleted

## 2014-03-11 DIAGNOSIS — Z7901 Long term (current) use of anticoagulants: Secondary | ICD-10-CM

## 2014-03-11 DIAGNOSIS — I4892 Unspecified atrial flutter: Secondary | ICD-10-CM

## 2014-03-11 LAB — POCT INR: INR: 4.6

## 2014-03-27 ENCOUNTER — Other Ambulatory Visit: Payer: Self-pay | Admitting: Interventional Cardiology

## 2014-03-29 ENCOUNTER — Ambulatory Visit (INDEPENDENT_AMBULATORY_CARE_PROVIDER_SITE_OTHER): Payer: Medicare Other | Admitting: Pharmacist

## 2014-03-29 DIAGNOSIS — Z7901 Long term (current) use of anticoagulants: Secondary | ICD-10-CM

## 2014-03-29 DIAGNOSIS — I4892 Unspecified atrial flutter: Secondary | ICD-10-CM

## 2014-03-29 LAB — POCT INR: INR: 2.6

## 2014-04-19 ENCOUNTER — Other Ambulatory Visit: Payer: Self-pay | Admitting: Interventional Cardiology

## 2014-05-01 ENCOUNTER — Encounter: Payer: Self-pay | Admitting: Interventional Cardiology

## 2014-05-01 ENCOUNTER — Ambulatory Visit (INDEPENDENT_AMBULATORY_CARE_PROVIDER_SITE_OTHER): Payer: Medicare Other | Admitting: *Deleted

## 2014-05-01 ENCOUNTER — Ambulatory Visit (INDEPENDENT_AMBULATORY_CARE_PROVIDER_SITE_OTHER): Payer: Medicare Other | Admitting: Interventional Cardiology

## 2014-05-01 VITALS — BP 122/76 | HR 59 | Ht 71.0 in | Wt 219.1 lb

## 2014-05-01 DIAGNOSIS — I6529 Occlusion and stenosis of unspecified carotid artery: Secondary | ICD-10-CM

## 2014-05-01 DIAGNOSIS — I25810 Atherosclerosis of coronary artery bypass graft(s) without angina pectoris: Secondary | ICD-10-CM

## 2014-05-01 DIAGNOSIS — N183 Chronic kidney disease, stage 3 unspecified: Secondary | ICD-10-CM

## 2014-05-01 DIAGNOSIS — I2510 Atherosclerotic heart disease of native coronary artery without angina pectoris: Secondary | ICD-10-CM

## 2014-05-01 DIAGNOSIS — Z7901 Long term (current) use of anticoagulants: Secondary | ICD-10-CM

## 2014-05-01 DIAGNOSIS — I4892 Unspecified atrial flutter: Secondary | ICD-10-CM

## 2014-05-01 DIAGNOSIS — I5032 Chronic diastolic (congestive) heart failure: Secondary | ICD-10-CM

## 2014-05-01 DIAGNOSIS — I779 Disorder of arteries and arterioles, unspecified: Secondary | ICD-10-CM | POA: Insufficient documentation

## 2014-05-01 DIAGNOSIS — I739 Peripheral vascular disease, unspecified: Secondary | ICD-10-CM

## 2014-05-01 DIAGNOSIS — Z952 Presence of prosthetic heart valve: Secondary | ICD-10-CM

## 2014-05-01 DIAGNOSIS — I658 Occlusion and stenosis of other precerebral arteries: Secondary | ICD-10-CM

## 2014-05-01 DIAGNOSIS — Z953 Presence of xenogenic heart valve: Secondary | ICD-10-CM

## 2014-05-01 DIAGNOSIS — I6523 Occlusion and stenosis of bilateral carotid arteries: Secondary | ICD-10-CM

## 2014-05-01 DIAGNOSIS — Z79899 Other long term (current) drug therapy: Secondary | ICD-10-CM

## 2014-05-01 LAB — BASIC METABOLIC PANEL
BUN: 21 mg/dL (ref 6–23)
CALCIUM: 9.1 mg/dL (ref 8.4–10.5)
CO2: 28 mEq/L (ref 19–32)
CREATININE: 1.6 mg/dL — AB (ref 0.4–1.5)
Chloride: 99 mEq/L (ref 96–112)
GFR: 56.59 mL/min — ABNORMAL LOW (ref >60.00)
Glucose, Bld: 95 mg/dL (ref 70–99)
Potassium: 3.5 mEq/L (ref 3.5–5.1)
Sodium: 136 mEq/L (ref 135–145)

## 2014-05-01 LAB — POCT INR: INR: 3.1

## 2014-05-01 LAB — HEPATIC FUNCTION PANEL
ALBUMIN: 4 g/dL (ref 3.5–5.2)
ALK PHOS: 81 U/L (ref 39–117)
ALT: 43 U/L (ref 0–53)
AST: 42 U/L — ABNORMAL HIGH (ref 0–37)
BILIRUBIN DIRECT: 0.1 mg/dL (ref 0.0–0.3)
TOTAL PROTEIN: 8 g/dL (ref 6.0–8.3)
Total Bilirubin: 0.8 mg/dL (ref 0.2–1.2)

## 2014-05-01 LAB — TSH: TSH: 0.95 u[IU]/mL (ref 0.35–4.50)

## 2014-05-01 NOTE — Progress Notes (Signed)
Patient ID: KAELEN CAUGHLIN, male   DOB: May 21, 1941, 73 y.o.   MRN: [id number]    [address] [place], [place]  [address] Phone: [phone] Fax:  [phone]  Date:  05/01/2014   ID:  ODDIE BOTTGER, DOB June 12, 1941, MRN [id number]  PCP:  Irven Shelling, MD   ASSESSMENT:  1. 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 Chronic diastolic heart failure 3. Amiodarone therapy 4. Bilateral carotid disease 5. Peripheral arterial disease 6. History of atrial fibrillation/flutter  PLAN:  1. Bilateral carotid Doppler study 2. Basic metabolic panel 3. TSH and hepatic panel today 4. Clinical followup in 6 months   SUBJECTIVE: ARIYON MITTLEMAN is a 73 y.o. male who has no complaints. He denies syncope, angina, and orthopnea. He does notice lower extremity fatigue. No symptoms to suggest transient ischemic attacks. He did have a fall related to tripping with abrasions of the knees and hands. He denies syncope. He has had no prolonged palpitations.   Wt Readings from Last 3 Encounters:  05/01/14 219 lb 1.9 oz (99.392 kg)  11/06/13 220 lb 1.9 oz (99.846 kg)  08/07/13 212 lb (96.163 kg)     Past Medical History  Diagnosis Date  . Coronary artery disease   . Angina   . Hypertension   . Atrial flutter   . Obstructive sleep apnea     severe,states had test, no machine, used oxygen in past, not now    Current Outpatient Prescriptions  Medication Sig Dispense Refill  . amiodarone (PACERONE) 200 MG tablet TAKE 1 TABLET BY MOUTH AT BEDTIME  30 tablet  3  . amLODipine (NORVASC) 5 MG tablet TAKE 1 TABLET BY MOUTH EVERY DAY  30 tablet  1  . amoxicillin (AMOXIL) 500 MG capsule Take 2,000 mg by mouth once as needed (take 1 hour prior to dental appointment).      . CALCIUM PO Take by mouth as directed.      . furosemide (LASIX) 80 MG tablet Take 1.5 tablets (120 mg total) by mouth 2 (two) times daily.  90 tablet  11  . Glucosamine HCl  (GLUCOSAMINE PO) Take 1 tablet by mouth daily. Joint Support      . Multiple Vitamins-Minerals (MULTIVITAMIN PO) Take 2-3 tablets by mouth daily.      Marland Kitchen omega-3 acid ethyl esters (LOVAZA) 1 G capsule Take 1 g by mouth 3 (three) times daily.      Marland Kitchen OVER THE COUNTER MEDICATION Take 1 tablet by mouth daily as needed (when ever he thinks he needs it). Ultimate prostate      . OVER THE COUNTER MEDICATION Take 1 tablet by mouth daily as needed (when ever he thinks he needs it). Male factor/Prelox Blue      . OVER THE COUNTER MEDICATION Take 1 tablet by mouth daily as needed (when ever he thinks he needs it). Schizandra/RoseGuard      . OVER THE COUNTER MEDICATION Take 1 tablet by mouth daily as needed (when ever he thinks he needs it). Snack Defense      . OVER THE COUNTER MEDICATION Take 1 tablet by mouth daily as needed (when ever he thinks he needs it). Niteworks      . potassium chloride SA (K-DUR,KLOR-CON) 20 MEQ tablet Take 1 tablet (20 mEq total) by mouth daily.  90 tablet  1  . rosuvastatin (CRESTOR) 5 MG tablet Take 5 mg by mouth at bedtime.       Marland Kitchen  warfarin (COUMADIN) 5 MG tablet Take 2.5-5 mg by mouth at bedtime. Takes 1 tablet (5mg ) on Mondays and Fridays and a half tablet (2.5 mg) all other days       No current facility-administered medications for this visit.    Allergies:   No Known Allergies  Social History:  The patient  reports that he quit smoking about 35 years ago. His smoking use included Cigarettes. He started smoking about 44 years ago. He has a 1.3 pack-year smoking history. He has never used smokeless tobacco. He reports that he drinks about 1.2 ounces of alcohol per week. He reports that he does not use illicit drugs.   ROS:  Please see the history of present illness.    Claudication bilateral   All other systems reviewed and negative.   OBJECTIVE: VS:  BP 122/76  Pulse 59  Ht 5\' 11"  (1.803 m)  Wt 219 lb 1.9 oz (99.392 kg)  BMI 30.57 kg/m2 Well nourished, well  developed, in no acute distress, elderly HEENT: normal Neck: JVD flat. Carotid bruit noted on left  Cardiac:  normal S1, S2; RRR; 2/6 systolic in 2-3 of 6 decrescendo diastolic murmur Lungs:  clear to auscultation bilaterally, no wheezing, rhonchi or rales Abd: soft, nontender, no hepatomegaly Ext: Edema absent. Pulses difficult to palpate Skin: warm and dry Neuro:  CNs 2-12 intact, no focal abnormalities noted  EKG:  Not performed       Signed, Illene Labrador III, MD 05/01/2014 10:56 AM

## 2014-05-01 NOTE — Patient Instructions (Signed)
Your physician recommends that you continue on your current medications as directed. Please refer to the Current Medication list given to you today.  Lab Today: Bmet, Tsh, Hepatic  Your physician has requested that you have a carotid duplex. This test is an ultrasound of the carotid arteries in your neck. It looks at blood flow through these arteries that supply the brain with blood. Allow one hour for this exam. There are no restrictions or special instructions.  Your physician wants you to follow-up in: 6 months with Dr.Smith You will receive a reminder letter in the mail two months in advance. If you don't receive a letter, please call our office to schedule the follow-up appointment.

## 2014-05-02 ENCOUNTER — Ambulatory Visit (HOSPITAL_COMMUNITY): Payer: Medicare Other | Attending: Interventional Cardiology | Admitting: Cardiology

## 2014-05-02 DIAGNOSIS — I10 Essential (primary) hypertension: Secondary | ICD-10-CM | POA: Diagnosis not present

## 2014-05-02 DIAGNOSIS — Z87891 Personal history of nicotine dependence: Secondary | ICD-10-CM | POA: Diagnosis not present

## 2014-05-02 DIAGNOSIS — I658 Occlusion and stenosis of other precerebral arteries: Secondary | ICD-10-CM | POA: Diagnosis not present

## 2014-05-02 DIAGNOSIS — I6529 Occlusion and stenosis of unspecified carotid artery: Secondary | ICD-10-CM | POA: Diagnosis not present

## 2014-05-02 DIAGNOSIS — I6523 Occlusion and stenosis of bilateral carotid arteries: Secondary | ICD-10-CM

## 2014-05-02 NOTE — Progress Notes (Signed)
Carotid duplex performed 

## 2014-05-08 ENCOUNTER — Telehealth: Payer: Self-pay

## 2014-05-08 NOTE — Telephone Encounter (Signed)
Message copied by Lamar Laundry on Wed May 08, 2014 12:17 PM ------      Message from: Daneen Schick      Created: Tue May 07, 2014  7:07 PM       All labs are stable. ------

## 2014-05-08 NOTE — Telephone Encounter (Signed)
pt aware of lab results.All labs are stable.pt verbalized understanding.

## 2014-05-21 ENCOUNTER — Other Ambulatory Visit: Payer: Self-pay | Admitting: *Deleted

## 2014-05-21 MED ORDER — WARFARIN SODIUM 5 MG PO TABS
5.0000 mg | ORAL_TABLET | ORAL | Status: DC
Start: 2014-05-21 — End: 2014-12-02

## 2014-05-29 ENCOUNTER — Ambulatory Visit (INDEPENDENT_AMBULATORY_CARE_PROVIDER_SITE_OTHER): Payer: Medicare Other | Admitting: Pharmacist

## 2014-05-29 DIAGNOSIS — Z7901 Long term (current) use of anticoagulants: Secondary | ICD-10-CM

## 2014-05-29 DIAGNOSIS — I4892 Unspecified atrial flutter: Secondary | ICD-10-CM

## 2014-05-29 LAB — POCT INR: INR: 3.7

## 2014-05-30 ENCOUNTER — Other Ambulatory Visit: Payer: Self-pay | Admitting: Interventional Cardiology

## 2014-06-02 ENCOUNTER — Telehealth: Payer: Self-pay | Admitting: Cardiology

## 2014-06-02 NOTE — Telephone Encounter (Signed)
Please get a copy of patient's sleep study from 06/04/2013 from [facility]

## 2014-06-03 ENCOUNTER — Encounter: Payer: Self-pay | Admitting: Cardiology

## 2014-06-03 ENCOUNTER — Other Ambulatory Visit: Payer: Self-pay | Admitting: Interventional Cardiology

## 2014-06-03 NOTE — Telephone Encounter (Signed)
Printed and will be scanned in for you.

## 2014-06-18 ENCOUNTER — Ambulatory Visit (INDEPENDENT_AMBULATORY_CARE_PROVIDER_SITE_OTHER): Payer: Medicare Other | Admitting: *Deleted

## 2014-06-18 DIAGNOSIS — I4892 Unspecified atrial flutter: Secondary | ICD-10-CM

## 2014-06-18 DIAGNOSIS — Z7901 Long term (current) use of anticoagulants: Secondary | ICD-10-CM

## 2014-06-18 LAB — POCT INR: INR: 2

## 2014-06-27 ENCOUNTER — Telehealth: Payer: Self-pay | Admitting: Interventional Cardiology

## 2014-06-27 NOTE — Telephone Encounter (Signed)
New Message  Pt wife called requests a referral to an Inernist from Dr. Tamala Julian. Please call

## 2014-06-27 NOTE — Telephone Encounter (Signed)
Will route to Dr.Smith for his recommendation for a ref to internal medicine

## 2014-07-05 NOTE — Telephone Encounter (Signed)
[name] [name]

## 2014-07-05 NOTE — Telephone Encounter (Signed)
lmtcb. called pt to recoomend [place]. Dr.Shaw. for pcpc who still round at the [facility]

## 2014-07-05 NOTE — Telephone Encounter (Signed)
Called to give pt and pt wife Dr.Smith recommendations on an Internal Medicine physician.Dr.Smith recommends Dr.Polite. Pt wife sts that Dr.Polite and pt current pcp are at the same practice. [facility]'s will no longer round on the pt that are hospitalized @ [facility]. Adv her that there is only 1 local pcp practice that still see their pt in the hospital adv. Her I will call her back with that info. Pt wife verbalized understanding.

## 2014-07-09 ENCOUNTER — Ambulatory Visit (INDEPENDENT_AMBULATORY_CARE_PROVIDER_SITE_OTHER): Payer: Medicare Other

## 2014-07-09 DIAGNOSIS — Z7901 Long term (current) use of anticoagulants: Secondary | ICD-10-CM

## 2014-07-09 DIAGNOSIS — I4892 Unspecified atrial flutter: Secondary | ICD-10-CM

## 2014-07-09 LAB — POCT INR: INR: 2.3

## 2014-07-17 ENCOUNTER — Telehealth: Payer: Self-pay | Admitting: Interventional Cardiology

## 2014-07-17 NOTE — Telephone Encounter (Signed)
Follow up:    Pt's wife returning [place] call.   Wife would like [name] to call her back.

## 2014-07-17 NOTE — Telephone Encounter (Signed)
Pt's wife wants for Lattie Haw to call her tomorrow . Pt's wife  said that is nothing urgent.

## 2014-07-18 NOTE — Telephone Encounter (Signed)
Returned pt wife call. recommended [place]. Dr.Shaw. for pcp who still round at the [facility]. Tel # provided

## 2014-08-06 ENCOUNTER — Ambulatory Visit (INDEPENDENT_AMBULATORY_CARE_PROVIDER_SITE_OTHER): Payer: Medicare Other

## 2014-08-06 DIAGNOSIS — I4892 Unspecified atrial flutter: Secondary | ICD-10-CM

## 2014-08-06 DIAGNOSIS — Z7901 Long term (current) use of anticoagulants: Secondary | ICD-10-CM

## 2014-08-06 LAB — POCT INR: INR: 2.3

## 2014-08-15 ENCOUNTER — Encounter (HOSPITAL_COMMUNITY): Payer: Self-pay | Admitting: Internal Medicine

## 2014-08-16 ENCOUNTER — Telehealth: Payer: Self-pay | Admitting: Interventional Cardiology

## 2014-08-16 NOTE — Telephone Encounter (Signed)
Returned pt wife call. She reports that pt has not taken his furosemide for several days.last night he had to sit up in bed and was unable to lay flat. Pt went to work this morning, his job called his wife at 10am to pick pt up, he was feeling weak.pt wife called the office she was concern that pt could be in afib. Pt is currently at home, he seems to be doing ok, asymptomatic.Denies chest pain, sob,palpitations, swelling. Pt took his diuretic medication this morning @ 10am.pt weighs once a week.pt has a bp machine at home, adv pt wife to ck bp and heartrate and I will call back in 15min for an update.  Pt wife reports pt bp was 150/52 64bpm. Pt is currently doing ok.he has been taking all his other medications as prescribed. He is having good urine output and has already made several trips to the rest room. I stressed the importance of medication compliance.pt is to call back if symptoms develop or further assistance is needed. Pt wife agreeable and verbalized understanding.

## 2014-08-16 NOTE — Telephone Encounter (Signed)
New message     Last night in bed---pt had to sleep sitting up.  He went to work today.  Wife says, his job just called to ask if she could come get him.  She is picking him up and says he needs to see Dr Tamala Julian.  I told her Dr Tamala Julian was not in the office.  She said they go to church with Dr Tamala Julian and he would see him.  She is going to pick pt up from work and see if he has chest pain, sob ,etc.  Please call

## 2014-08-17 ENCOUNTER — Other Ambulatory Visit: Payer: Self-pay | Admitting: Interventional Cardiology

## 2014-08-20 NOTE — Telephone Encounter (Signed)
New Message        Pt's wife calling stating that pt has had nose bleeds x3 days. Please call back and advise.

## 2014-08-20 NOTE — Telephone Encounter (Signed)
Returned pt wife call. She reports that pt has had reoccurring nose bleeds the last 2 days. She is concerned because the pt is on coumadin. pt INR has been therapeutic.adv her to apply pressure as much as possible keep nostrils lubricated ok to use vaseline. She should not pack pt nose with tissue, because tissue will stick and irritate blood vessels.adv her that if pt has a nose bleed that will not stop he is to go to the ED so they can pack it.she verbalized understanding.

## 2014-08-27 ENCOUNTER — Other Ambulatory Visit: Payer: Self-pay

## 2014-08-27 ENCOUNTER — Telehealth: Payer: Self-pay | Admitting: Interventional Cardiology

## 2014-08-27 MED ORDER — ROSUVASTATIN CALCIUM 5 MG PO TABS: 5.0000 mg | ORAL_TABLET | Freq: Every day | ORAL | Status: DC

## 2014-08-27 NOTE — Telephone Encounter (Signed)
Error disregard note. They don't want another nurse to contact. Will wait for Dr. Tamala Julian nurse

## 2014-09-03 ENCOUNTER — Telehealth: Payer: Self-pay | Admitting: Interventional Cardiology

## 2014-09-03 NOTE — Telephone Encounter (Signed)
Spoke with Ms. Muzquiz.  Explained that there is nothing different we would do with his Coumadin for a long plane ride.  We suggest getting up and walking in the aisle every few hours.

## 2014-09-03 NOTE — Telephone Encounter (Signed)
New message      Patient is going to [place]-----a long plane ride.  Will he need to have his warfarin dosage changed?

## 2014-09-15 ENCOUNTER — Other Ambulatory Visit: Payer: Self-pay | Admitting: Interventional Cardiology

## 2014-09-16 ENCOUNTER — Other Ambulatory Visit: Payer: Self-pay | Admitting: *Deleted

## 2014-09-16 MED ORDER — FUROSEMIDE 80 MG PO TABS
120.0000 mg | ORAL_TABLET | Freq: Two times a day (BID) | ORAL | Status: DC
Start: 2014-09-16 — End: 2014-11-16

## 2014-09-17 ENCOUNTER — Ambulatory Visit (INDEPENDENT_AMBULATORY_CARE_PROVIDER_SITE_OTHER): Payer: Medicare Other | Admitting: *Deleted

## 2014-09-17 DIAGNOSIS — I4892 Unspecified atrial flutter: Secondary | ICD-10-CM

## 2014-09-17 DIAGNOSIS — Z7901 Long term (current) use of anticoagulants: Secondary | ICD-10-CM

## 2014-09-17 LAB — POCT INR: INR: 1.9

## 2014-10-28 ENCOUNTER — Ambulatory Visit (INDEPENDENT_AMBULATORY_CARE_PROVIDER_SITE_OTHER): Payer: Medicare Other

## 2014-10-28 DIAGNOSIS — I4892 Unspecified atrial flutter: Secondary | ICD-10-CM

## 2014-10-28 DIAGNOSIS — Z7901 Long term (current) use of anticoagulants: Secondary | ICD-10-CM

## 2014-10-28 LAB — POCT INR: INR: 2

## 2014-11-12 ENCOUNTER — Other Ambulatory Visit: Payer: Self-pay | Admitting: Radiology

## 2014-11-12 DIAGNOSIS — I6523 Occlusion and stenosis of bilateral carotid arteries: Secondary | ICD-10-CM

## 2014-11-16 ENCOUNTER — Other Ambulatory Visit: Payer: Self-pay | Admitting: Interventional Cardiology

## 2014-11-26 ENCOUNTER — Ambulatory Visit (HOSPITAL_COMMUNITY): Payer: Medicare Other | Attending: Cardiology | Admitting: *Deleted

## 2014-11-26 DIAGNOSIS — I6523 Occlusion and stenosis of bilateral carotid arteries: Secondary | ICD-10-CM

## 2014-11-26 NOTE — Progress Notes (Signed)
Carotid Duplex Scan Performed 

## 2014-11-28 ENCOUNTER — Telehealth: Payer: Self-pay | Admitting: Interventional Cardiology

## 2014-11-28 ENCOUNTER — Telehealth: Payer: Self-pay

## 2014-11-28 DIAGNOSIS — I6523 Occlusion and stenosis of bilateral carotid arteries: Secondary | ICD-10-CM

## 2014-11-28 NOTE — Telephone Encounter (Signed)
called to give pt carotid results.lmtcb

## 2014-11-28 NOTE — Telephone Encounter (Signed)
-----   Message from Belva Crome, MD sent at 11/28/2014 11:35 AM EDT ----- The carotid Doppler reveals less than 79% on the right and less than 59% on the left. Recommended to follow-up in 6 months. Stable compared to prior.

## 2014-11-28 NOTE — Telephone Encounter (Signed)
Pt wife aware of carotid results with verbal understanding.The carotid Doppler reveals less than 79% on the right and less than 59% on the left. Recommended to follow-up in 6 months. Stable compared to prior.

## 2014-11-28 NOTE — Telephone Encounter (Signed)
Follow Up         Pt returning [name]'s phone call.

## 2014-11-28 NOTE — Telephone Encounter (Signed)
Follow Up       Pt returning [name]'s phone call.

## 2014-12-02 ENCOUNTER — Encounter: Payer: Self-pay | Admitting: Interventional Cardiology

## 2014-12-02 ENCOUNTER — Ambulatory Visit (INDEPENDENT_AMBULATORY_CARE_PROVIDER_SITE_OTHER): Payer: Medicare Other | Admitting: Pharmacist

## 2014-12-02 ENCOUNTER — Ambulatory Visit
Admission: RE | Admit: 2014-12-02 | Discharge: 2014-12-02 | Disposition: A | Discharge status: Home / Self Care | Payer: Medicare Other | Source: Ambulatory Visit | Attending: Interventional Cardiology | Admitting: Interventional Cardiology

## 2014-12-02 ENCOUNTER — Ambulatory Visit (INDEPENDENT_AMBULATORY_CARE_PROVIDER_SITE_OTHER): Payer: Medicare Other | Admitting: Interventional Cardiology

## 2014-12-02 VITALS — BP 154/58 | HR 64 | Ht 71.0 in | Wt 215.0 lb

## 2014-12-02 DIAGNOSIS — I5032 Chronic diastolic (congestive) heart failure: Secondary | ICD-10-CM | POA: Diagnosis not present

## 2014-12-02 DIAGNOSIS — Z7901 Long term (current) use of anticoagulants: Secondary | ICD-10-CM | POA: Diagnosis not present

## 2014-12-02 DIAGNOSIS — T8201XA Breakdown (mechanical) of heart valve prosthesis, initial encounter: Secondary | ICD-10-CM

## 2014-12-02 DIAGNOSIS — I25708 Atherosclerosis of coronary artery bypass graft(s), unspecified, with other forms of angina pectoris: Secondary | ICD-10-CM

## 2014-12-02 DIAGNOSIS — T8209XA Other mechanical complication of heart valve prosthesis, initial encounter: Secondary | ICD-10-CM

## 2014-12-02 DIAGNOSIS — I10 Essential (primary) hypertension: Secondary | ICD-10-CM

## 2014-12-02 DIAGNOSIS — I97110 Postprocedural cardiac insufficiency following cardiac surgery: Secondary | ICD-10-CM

## 2014-12-02 DIAGNOSIS — Z79899 Other long term (current) drug therapy: Secondary | ICD-10-CM

## 2014-12-02 DIAGNOSIS — I4892 Unspecified atrial flutter: Secondary | ICD-10-CM

## 2014-12-02 DIAGNOSIS — I6523 Occlusion and stenosis of bilateral carotid arteries: Secondary | ICD-10-CM

## 2014-12-02 LAB — HEPATIC FUNCTION PANEL
ALT: 54 U/L — AB (ref 0–53)
AST: 58 U/L — AB (ref 0–37)
Albumin: 4.1 g/dL (ref 3.5–5.2)
Alkaline Phosphatase: 79 U/L (ref 39–117)
Bilirubin, Direct: 0.1 mg/dL (ref 0.0–0.3)
Total Bilirubin: 0.6 mg/dL (ref 0.2–1.2)
Total Protein: 7.7 g/dL (ref 6.0–8.3)

## 2014-12-02 LAB — TSH: TSH: 1.2 u[IU]/mL (ref 0.35–4.50)

## 2014-12-02 LAB — POCT INR: INR: 2

## 2014-12-02 MED ORDER — WARFARIN SODIUM 5 MG PO TABS: 5.0000 mg | ORAL_TABLET | ORAL | Status: DC

## 2014-12-02 NOTE — Patient Instructions (Signed)
Your physician recommends that you continue on your current medications as directed. Please refer to the Current Medication list given to you today.  Amiodarone suvelliance labs today: TSH, LFT  A chest x-ray takes a picture of the organs and structures inside the chest, including the heart, lungs, and blood vessels. This test can show several things, including, whether the heart is enlarges; whether fluid is building up in the lungs; and whether pacemaker / defibrillator leads are still in place.  Your physician wants you to follow-up in: 6 months with Dr. Tamala Julian.  You will receive a reminder letter in the mail two months in advance. If you don't receive a letter, please call our office to schedule the follow-up appointment.  Thank you for choosing [facility]!!

## 2014-12-02 NOTE — Progress Notes (Signed)
Cardiology Office Note   Date:  12/02/2014   ID:  [name] [name], DOB [birth year], MRN [id number]  PCP:  Velna Hatchet, MD  Cardiologist:   Sinclair Grooms, MD   No chief complaint on file.     History of Present Illness: [name] [name] is a 74 y.o. male who presents for CAD, chronic combined systolic and diastolic heart failure. He denies orthopnea. Exertional tolerance is still limited by bilateral calf discomfort. No significant progression since the last office visit. He has had no neurological complaints. No blood in the urine or stool. He is ordered an INR checked today.    Past Medical History  Diagnosis Date  . Coronary artery disease   . Angina   . Hypertension   . Atrial flutter   . Obstructive sleep apnea     severe,states had test, no machine, used oxygen in past, not now    Past Surgical History  Procedure Laterality Date  . Cardiac valve replacement      aortic valve replacement (bioprosthetic)  . Tee without cardioversion  11/30/2011    Procedure: TRANSESOPHAGEAL ECHOCARDIOGRAM (TEE);  Surgeon: Sueanne Margarita, MD;  Location: [facility] ENDOSCOPY;  Service: Cardiovascular;  Laterality: N/A;  . Coronary artery bypass graft      x3 vessels  . Atrial flutter ablation  07-05-13    05-15-13  . Colonoscopy with propofol N/A 07/17/2013    Procedure: COLONOSCOPY WITH PROPOFOL;  Surgeon: Garlan Fair, MD;  Location: [facility] ENDOSCOPY;  Service: Endoscopy;  Laterality: N/A;  . Esophagogastroduodenoscopy (egd) with propofol N/A 07/17/2013    Procedure: ESOPHAGOGASTRODUODENOSCOPY (EGD) WITH PROPOFOL;  Surgeon: Garlan Fair, MD;  Location: [facility] ENDOSCOPY;  Service: Endoscopy;  Laterality: N/A;  . Atrial flutter ablation N/A 05/15/2013    Procedure: ATRIAL FLUTTER ABLATION;  Surgeon: Coralyn Mark, MD;  Location: [facility] CATH LAB;  Service: Cardiovascular;  Laterality: N/A;     Current Outpatient Prescriptions  Medication Sig Dispense Refill  . amiodarone (PACERONE) 200 MG  tablet TAKE 1 TABLET BY MOUTH AT BEDTIME 30 tablet 3  . amLODipine (NORVASC) 5 MG tablet TAKE 1 TABLET BY MOUTH EVERY DAY 30 tablet 1  . furosemide (LASIX) 80 MG tablet TAKE 1.5 TABLETS (120 MG TOTAL) BY MOUTH 2 (TWO) TIMES DAILY. 90 tablet 1  . Glucosamine HCl (GLUCOSAMINE PO) Take 1 tablet by mouth daily. Joint Support    . Multiple Vitamins-Minerals (MULTIVITAMIN PO) Take 2-3 tablets by mouth daily.    Marland Kitchen omega-3 acid ethyl esters (LOVAZA) 1 G capsule Take 1 g by mouth 3 (three) times daily.    Marland Kitchen OVER THE COUNTER MEDICATION Take 1 tablet by mouth daily as needed (when ever he thinks he needs it). Ultimate prostate    . OVER THE COUNTER MEDICATION Take 1 tablet by mouth daily as needed (when ever he thinks he needs it). Male factor/Prelox Blue    . OVER THE COUNTER MEDICATION Take 1 tablet by mouth daily as needed (when ever he thinks he needs it). Schizandra/RoseGuard    . OVER THE COUNTER MEDICATION Take 1 tablet by mouth daily as needed (when ever he thinks he needs it). Snack Defense    . OVER THE COUNTER MEDICATION Take 1 tablet by mouth daily as needed (when ever he thinks he needs it). Niteworks    . rosuvastatin (CRESTOR) 5 MG tablet Take 1 tablet (5 mg total) by mouth at bedtime. 30 tablet 6  . warfarin (COUMADIN) 5 MG tablet Take  1 tablet (5 mg total) by mouth as directed. Takes 1 tablet (5mg ) on Mondays and Fridays and a half tablet (2.5 mg) all other days 30 tablet 3   No current facility-administered medications for this visit.    Allergies:   Review of patient's allergies indicates no known allergies.    Social History:  The patient  reports that he quit smoking about 36 years ago. His smoking use included Cigarettes. He started smoking about 45 years ago. He has a 1.3 pack-year smoking history. He has never used smokeless tobacco. He reports that he drinks about 1.2 oz of alcohol per week. He reports that he does not use illicit drugs.   Family History:  The patient's family  history includes Heart disease in his mother.    ROS:  Please see the history of present illness.   Otherwise, review of systems are positive for fatigue.   All other systems are reviewed and negative.    PHYSICAL EXAM: VS:  BP 154/58 mmHg  Pulse 64  Ht 5\' 11"  (1.803 m)  Wt 215 lb (97.523 kg)  BMI 30.00 kg/m2 , BMI Body mass index is 30 kg/(m^2). GEN: Well nourished, well developed, in no acute distress HEENT: normal Neck: no JVD, carotid bruits, or masses Cardiac: RRR; 2 to 3/6 decrescendo murmur of aortic regurgitation. He has no rubs, or gallops,no edema . Respiratory:  clear to auscultation bilaterally, normal work of breathing GI: soft, nontender, nondistended, + BS MS: no deformity or atrophy Skin: warm and dry, no rash Neuro:  Strength and sensation are intact Psych: euthymic mood, full affect   EKG:  EKG is ordered today. The ekg ordered today demonstrates sinus rhythm with first-degree AV block. Interventricular conduction delay.   Recent Labs: 05/01/2014: ALT 43; BUN 21; Creatinine 1.6*; Potassium 3.5; Sodium 136; TSH 0.95    Lipid Panel No results found for: CHOL, TRIG, HDL, CHOLHDL, VLDL, LDLCALC, LDLDIRECT    Wt Readings from Last 3 Encounters:  12/02/14 215 lb (97.523 kg)  05/01/14 219 lb 1.9 oz (99.392 kg)  11/06/13 220 lb 1.9 oz (99.846 kg)      Other studies Reviewed: Additional studies/ records that were reviewed today include: Clinical data reviewed. No chest x-ray in over one year.. Review of the above records demonstrates: Appropriate laboratory data ordered   ASSESSMENT AND PLAN:  Atrial flutter with rapid ventricular response - no clinical recurrence  Chronic diastolic heart failure: No evidence of volume overload  Prosthetic valve dysfunction: 2 to 3/6 decrescendo murmur of aortic regurgitation is heard. Unchanged from prior.  Coronary artery disease involving coronary bypass graft of native heart with other forms of angina  pectoris  Long term current use of anticoagulant therapy  On amiodarone therapy - Plan: DG Chest 2 View, TSH, Hepatic function panel  Essential hypertension: Denies symptoms. His had medications today. Repeat blood pressure performed by me was 136/62 mmHg    Current medicines are reviewed at length with the patient today.  The patient does not have concerns regarding medicines.  The following changes have been made:  no change  Labs/ tests ordered today include:   Orders Placed This Encounter  Procedures  . DG Chest 2 View  . TSH  . Hepatic function panel  . EKG 12-Lead     Disposition:   FU with [name] [name] in 6 months    Signed, Sinclair Grooms, MD  12/02/2014 3:01 PM    [address],  [place], [place]  [address] Phone: [phone]; Fax: [phone]

## 2014-12-05 ENCOUNTER — Telehealth: Payer: Self-pay

## 2014-12-05 NOTE — Telephone Encounter (Signed)
-----   Message from Belva Crome, MD sent at 12/03/2014  4:30 PM EDT ----- Overall the chest x-ray shows no evidence of toxicity from amiodarone

## 2014-12-05 NOTE — Telephone Encounter (Signed)
called to give pt cxr and lab results.lmtcb

## 2014-12-17 ENCOUNTER — Other Ambulatory Visit: Payer: Self-pay | Admitting: Interventional Cardiology

## 2015-01-01 ENCOUNTER — Ambulatory Visit (INDEPENDENT_AMBULATORY_CARE_PROVIDER_SITE_OTHER): Payer: Medicare Other | Admitting: *Deleted

## 2015-01-01 DIAGNOSIS — I4892 Unspecified atrial flutter: Secondary | ICD-10-CM | POA: Diagnosis not present

## 2015-01-01 DIAGNOSIS — Z7901 Long term (current) use of anticoagulants: Secondary | ICD-10-CM

## 2015-01-01 LAB — POCT INR: INR: 2.2

## 2015-01-15 ENCOUNTER — Other Ambulatory Visit: Payer: Self-pay | Admitting: Interventional Cardiology

## 2015-02-12 ENCOUNTER — Ambulatory Visit (INDEPENDENT_AMBULATORY_CARE_PROVIDER_SITE_OTHER): Payer: Medicare Other | Admitting: *Deleted

## 2015-02-12 DIAGNOSIS — I4892 Unspecified atrial flutter: Secondary | ICD-10-CM

## 2015-02-12 DIAGNOSIS — Z7901 Long term (current) use of anticoagulants: Secondary | ICD-10-CM | POA: Diagnosis not present

## 2015-02-12 LAB — POCT INR: INR: 1.9

## 2015-03-05 ENCOUNTER — Ambulatory Visit (INDEPENDENT_AMBULATORY_CARE_PROVIDER_SITE_OTHER): Payer: Medicare Other | Admitting: *Deleted

## 2015-03-05 DIAGNOSIS — Z7901 Long term (current) use of anticoagulants: Secondary | ICD-10-CM

## 2015-03-05 DIAGNOSIS — I4892 Unspecified atrial flutter: Secondary | ICD-10-CM

## 2015-03-05 LAB — POCT INR: INR: 1.5

## 2015-03-24 ENCOUNTER — Other Ambulatory Visit: Payer: Self-pay | Admitting: Interventional Cardiology

## 2015-03-24 ENCOUNTER — Ambulatory Visit (INDEPENDENT_AMBULATORY_CARE_PROVIDER_SITE_OTHER): Payer: Medicare Other | Admitting: *Deleted

## 2015-03-24 DIAGNOSIS — I4892 Unspecified atrial flutter: Secondary | ICD-10-CM

## 2015-03-24 DIAGNOSIS — Z7901 Long term (current) use of anticoagulants: Secondary | ICD-10-CM | POA: Diagnosis not present

## 2015-03-24 LAB — POCT INR: INR: 2.8

## 2015-04-07 ENCOUNTER — Other Ambulatory Visit: Payer: Self-pay

## 2015-04-07 MED ORDER — ROSUVASTATIN CALCIUM 5 MG PO TABS: 5.0000 mg | ORAL_TABLET | Freq: Every day | ORAL | Status: AC

## 2015-04-07 NOTE — Telephone Encounter (Signed)
Belva Crome, MD at 12/02/2014 2:31 PM  rosuvastatin (CRESTOR) 5 MG tablet Take 1 tablet (5 mg total) by mouth at bedtime         Current medicines are reviewed at length with the patient today. The patient does not have concerns regarding medicines.  The following changes have been made: no change

## 2015-04-29 ENCOUNTER — Ambulatory Visit (INDEPENDENT_AMBULATORY_CARE_PROVIDER_SITE_OTHER): Payer: Medicare Other

## 2015-04-29 DIAGNOSIS — Z7901 Long term (current) use of anticoagulants: Secondary | ICD-10-CM

## 2015-04-29 DIAGNOSIS — I4892 Unspecified atrial flutter: Secondary | ICD-10-CM | POA: Diagnosis not present

## 2015-04-29 LAB — POCT INR: INR: 2.7

## 2015-05-07 ENCOUNTER — Other Ambulatory Visit: Payer: Self-pay | Admitting: Internal Medicine

## 2015-05-16 IMAGING — CR DG CHEST 2V
2 series · 2 of 2 positions shown · non-contrast
Comparison: Chest x-rays dated 05/15/2013 and 05/09/2013 and
05/24/2012

CLINICAL DATA: Amiodarone therapy.

EXAM:
CHEST  2 VIEW

[view not recorded (1 of 2)]
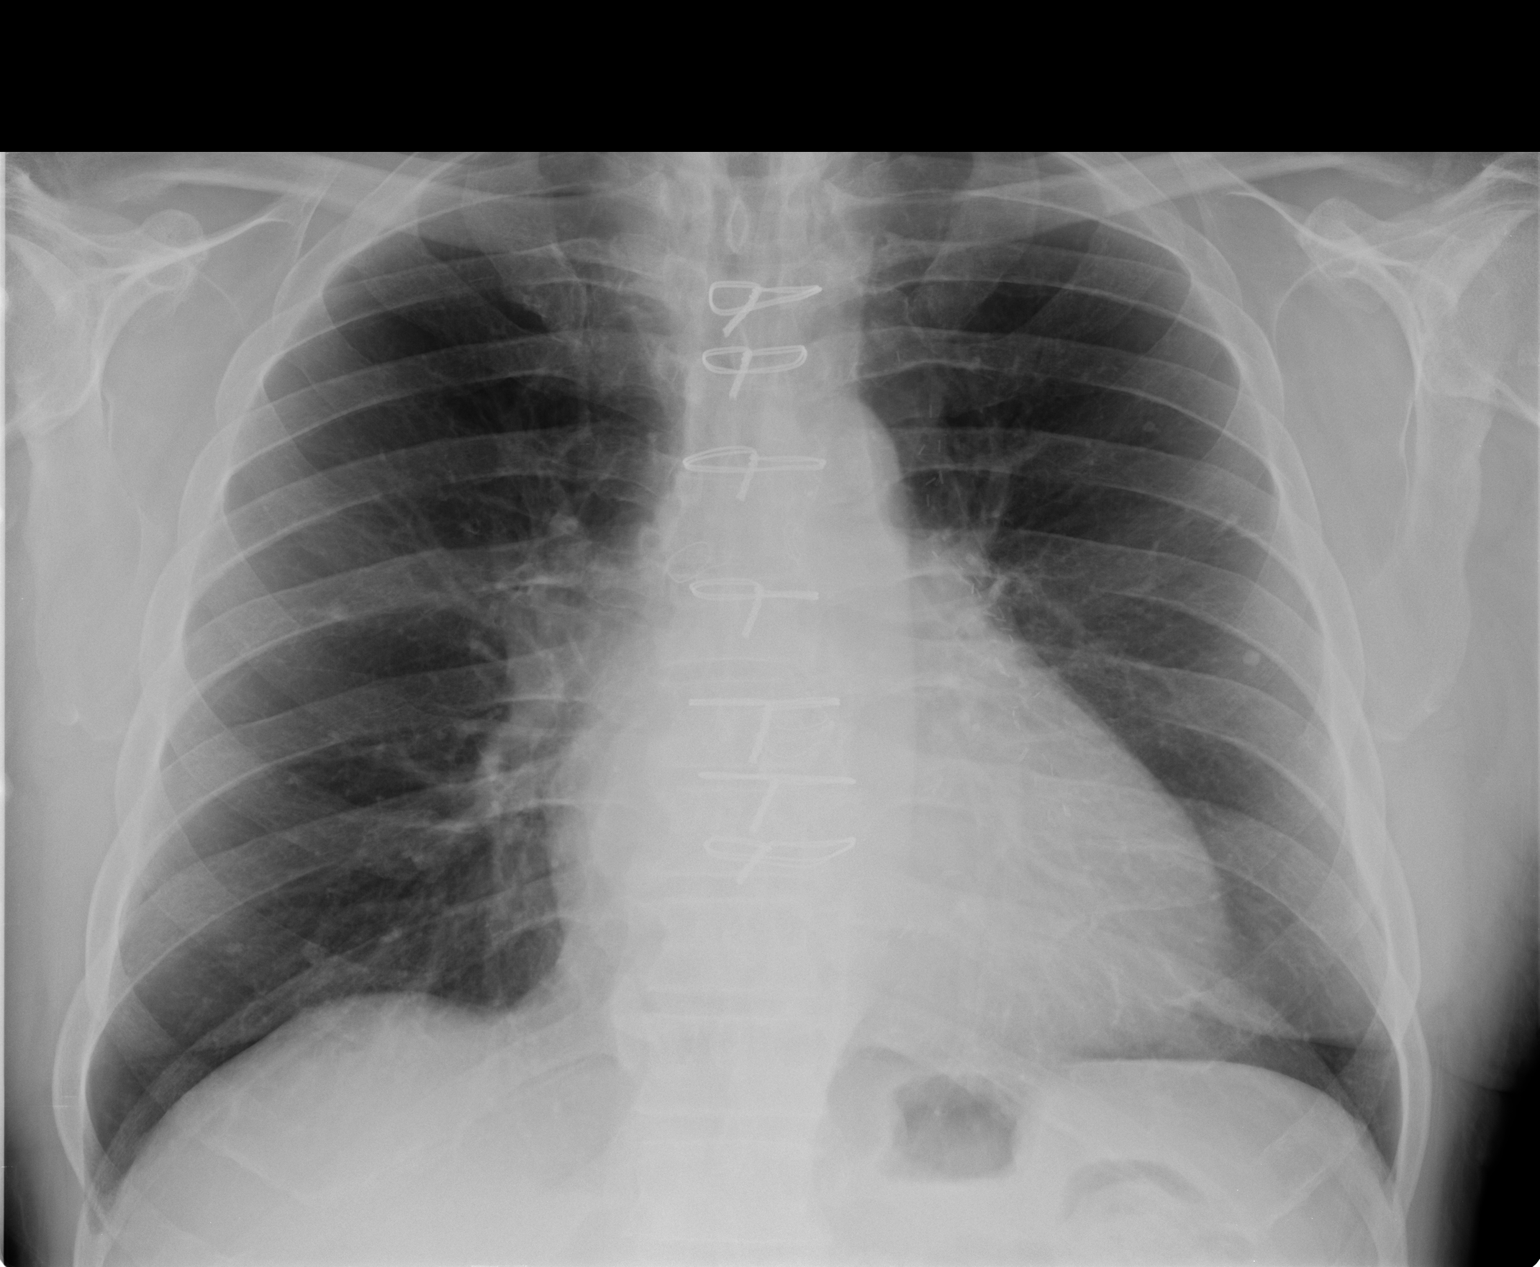

[view not recorded (2 of 2)]
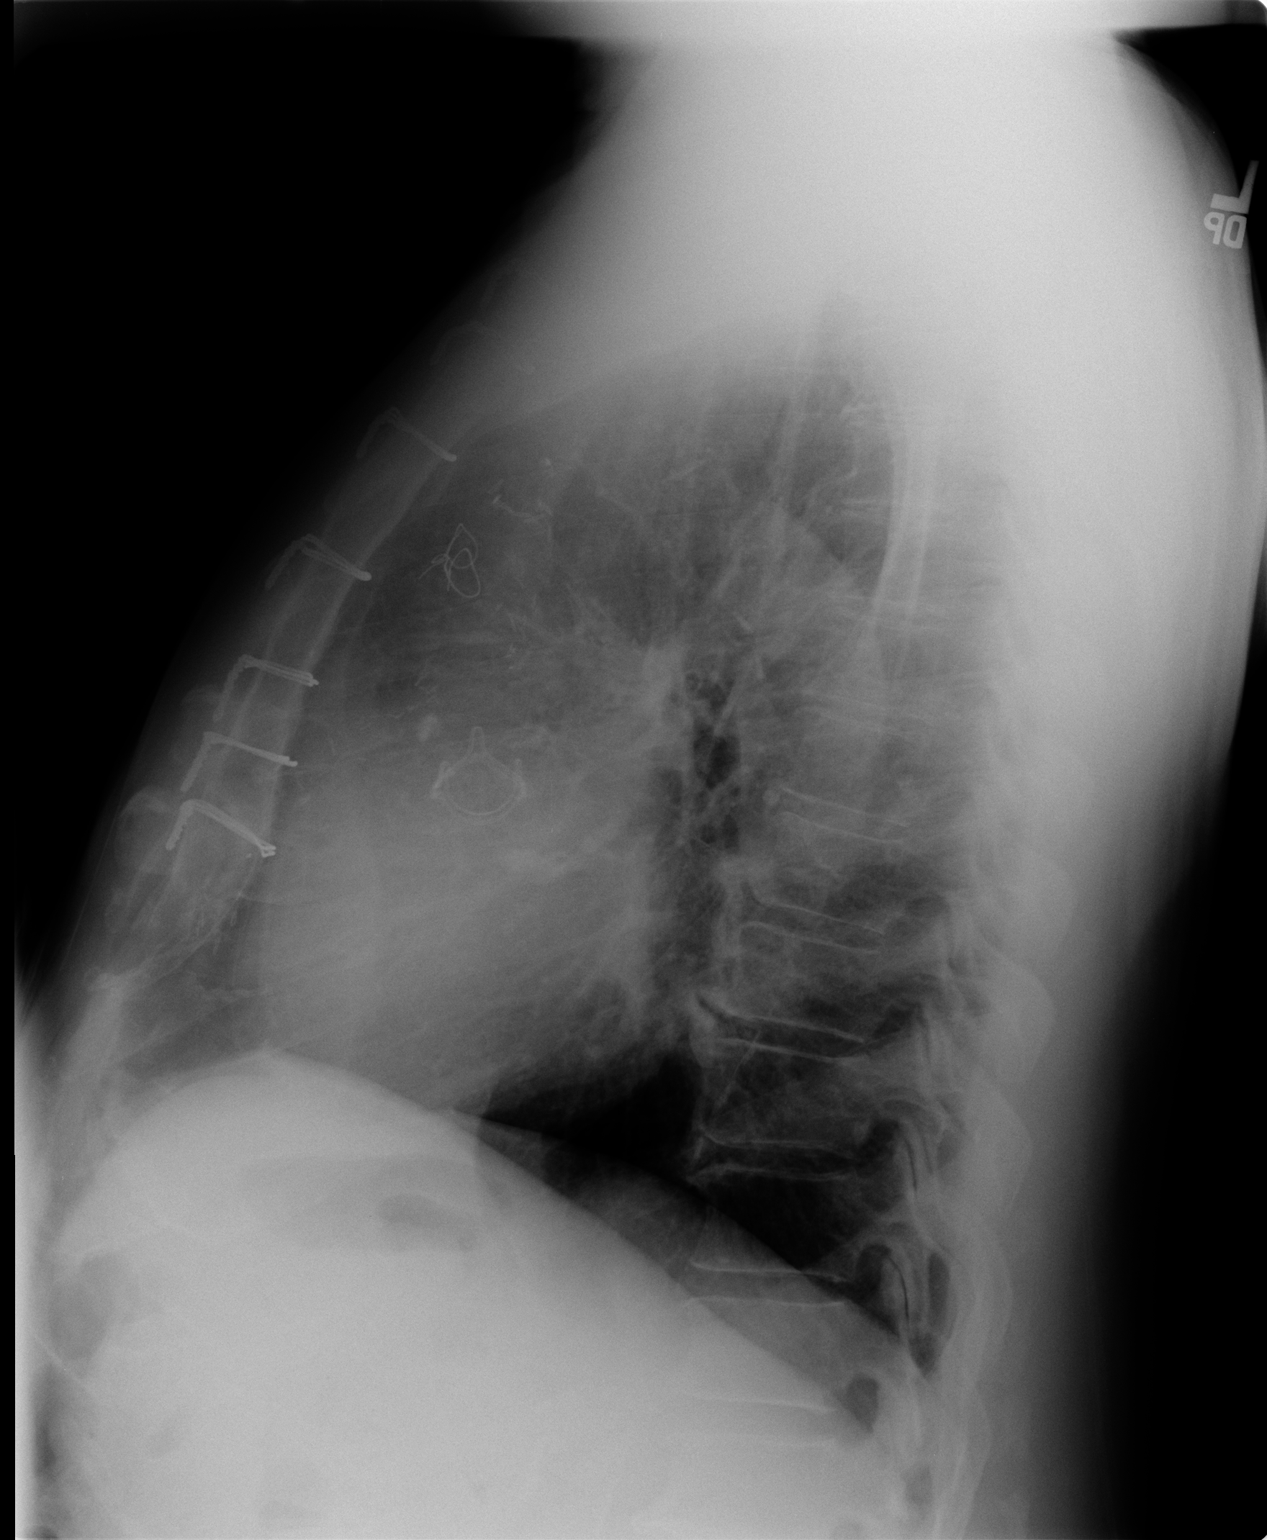

[2 of 2 positions shown; findings below may reference images not displayed]

FINDINGS: There is chronic borderline cardiomegaly. Pulmonary vascularity is
normal. There are multiple small calcified granulomas in both lungs.
There are no infiltrates or effusions. No interstitial accentuation.
No acute osseous abnormality. Chronic reversal of the lower thoracic
kyphosis. Prosthetic aortic valve is noted.
IMPRESSION: No acute disease.  Chronic borderline cardiomegaly.

## 2015-05-26 ENCOUNTER — Other Ambulatory Visit: Payer: Self-pay | Admitting: Interventional Cardiology

## 2015-05-26 DIAGNOSIS — I6523 Occlusion and stenosis of bilateral carotid arteries: Secondary | ICD-10-CM

## 2015-06-03 ENCOUNTER — Ambulatory Visit (INDEPENDENT_AMBULATORY_CARE_PROVIDER_SITE_OTHER): Payer: Medicare Other

## 2015-06-03 ENCOUNTER — Ambulatory Visit (HOSPITAL_COMMUNITY)
Admission: RE | Admit: 2015-06-03 | Discharge: 2015-06-03 | Disposition: A | Discharge status: Home / Self Care | Payer: Medicare Other | Source: Ambulatory Visit | Attending: Internal Medicine | Admitting: Internal Medicine

## 2015-06-03 DIAGNOSIS — I6523 Occlusion and stenosis of bilateral carotid arteries: Secondary | ICD-10-CM

## 2015-06-03 DIAGNOSIS — I10 Essential (primary) hypertension: Secondary | ICD-10-CM | POA: Insufficient documentation

## 2015-06-03 DIAGNOSIS — I4892 Unspecified atrial flutter: Secondary | ICD-10-CM

## 2015-06-03 DIAGNOSIS — G4733 Obstructive sleep apnea (adult) (pediatric): Secondary | ICD-10-CM | POA: Insufficient documentation

## 2015-06-03 DIAGNOSIS — Z7901 Long term (current) use of anticoagulants: Secondary | ICD-10-CM

## 2015-06-03 LAB — POCT INR: INR: 3.1

## 2015-06-05 ENCOUNTER — Telehealth: Payer: Self-pay

## 2015-06-05 DIAGNOSIS — I6523 Occlusion and stenosis of bilateral carotid arteries: Secondary | ICD-10-CM

## 2015-06-05 NOTE — Telephone Encounter (Signed)
Called to give pt LE study results. lmtcb

## 2015-06-05 NOTE — Telephone Encounter (Signed)
-----   Message from Belva Crome, MD sent at 06/05/2015  1:19 PM EDT ----- Stable left and right carotid plaque buildup. Needs follow-up in one year.

## 2015-06-05 NOTE — Telephone Encounter (Signed)
Pt wife aware of carotid results. Stable left and right carotid plaque buildup. Needs follow-up in one year.  Pt wife verbalized understanding.

## 2015-06-11 ENCOUNTER — Encounter: Payer: Self-pay | Admitting: Interventional Cardiology

## 2015-06-11 ENCOUNTER — Ambulatory Visit (INDEPENDENT_AMBULATORY_CARE_PROVIDER_SITE_OTHER): Payer: Medicare Other | Admitting: Interventional Cardiology

## 2015-06-11 VITALS — BP 160/62 | HR 64 | Ht 71.0 in | Wt 217.1 lb

## 2015-06-11 DIAGNOSIS — I25708 Atherosclerosis of coronary artery bypass graft(s), unspecified, with other forms of angina pectoris: Secondary | ICD-10-CM

## 2015-06-11 DIAGNOSIS — N183 Chronic kidney disease, stage 3 unspecified: Secondary | ICD-10-CM

## 2015-06-11 DIAGNOSIS — Z7901 Long term (current) use of anticoagulants: Secondary | ICD-10-CM

## 2015-06-11 DIAGNOSIS — I6523 Occlusion and stenosis of bilateral carotid arteries: Secondary | ICD-10-CM

## 2015-06-11 DIAGNOSIS — Z953 Presence of xenogenic heart valve: Secondary | ICD-10-CM

## 2015-06-11 DIAGNOSIS — Z79899 Other long term (current) drug therapy: Secondary | ICD-10-CM | POA: Diagnosis not present

## 2015-06-11 DIAGNOSIS — Z954 Presence of other heart-valve replacement: Secondary | ICD-10-CM

## 2015-06-11 DIAGNOSIS — I4892 Unspecified atrial flutter: Secondary | ICD-10-CM

## 2015-06-11 DIAGNOSIS — I5032 Chronic diastolic (congestive) heart failure: Secondary | ICD-10-CM

## 2015-06-11 DIAGNOSIS — I739 Peripheral vascular disease, unspecified: Secondary | ICD-10-CM

## 2015-06-11 LAB — COMPREHENSIVE METABOLIC PANEL
ALK PHOS: 66 U/L (ref 40–115)
ALT: 44 U/L (ref 9–46)
AST: 44 U/L — AB (ref 10–35)
Albumin: 4.2 g/dL (ref 3.6–5.1)
BUN: 25 mg/dL (ref 7–25)
CO2: 26 mmol/L (ref 20–31)
CREATININE: 1.51 mg/dL — AB (ref 0.70–1.18)
Calcium: 9 mg/dL (ref 8.6–10.3)
Chloride: 98 mmol/L (ref 98–110)
GLUCOSE: 105 mg/dL — AB (ref 65–99)
Potassium: 4.4 mmol/L (ref 3.5–5.3)
SODIUM: 134 mmol/L — AB (ref 135–146)
TOTAL PROTEIN: 7.5 g/dL (ref 6.1–8.1)
Total Bilirubin: 0.9 mg/dL (ref 0.2–1.2)

## 2015-06-11 LAB — TSH: TSH: 1.196 u[IU]/mL (ref 0.350–4.500)

## 2015-06-11 NOTE — Patient Instructions (Signed)
Medication Instructions:  Your physician recommends that you continue on your current medications as directed. Please refer to the Current Medication list given to you today.   Labwork: Tsh, Cmet today  Testing/Procedures: None ordered  Follow-Up: Your physician wants you to follow-up in: 6 months with Dr.Smith You will receive a reminder letter in the mail two months in advance. If you don't receive a letter, please call our office to schedule the follow-up appointment.   Any Other Special Instructions Will Be Listed Below (If Applicable).

## 2015-06-11 NOTE — Progress Notes (Signed)
Cardiology Office Note   Date:  06/11/2015   ID:  [name] [name], DOB 1941-07-30, MRN [id number]  PCP:  No PCP Per Patient  Cardiologist:  Sinclair Grooms, MD   Chief Complaint  Patient presents with  . Congestive Heart Failure      History of Present Illness: [name] [name] is a 74 y.o. male who presents for  Diastolic heart failure, paroxysmal atrial fibrillation, coronary artery disease, PAD with claudication,  Essential hypertension,chronic anticoagulation therapy, chronic amiodarone therapy.   Not compliant with the twice a day diuretic regimen. Denies angina. Still has claudication but stable.  He has a part-time job requires considerable walking. He does valet parking.Denies orthopnea and PND. No episodes of syncope. No rest pain in legs.  He denies palpitations, syncope, and transient neurological symptoms.  Past Medical History  Diagnosis Date  . Coronary artery disease   . Angina   . Hypertension   . Atrial flutter ([place])   . Obstructive sleep apnea     severe,states had test, no machine, used oxygen in past, not now    Past Surgical History  Procedure Laterality Date  . Cardiac valve replacement      aortic valve replacement (bioprosthetic)  . Tee without cardioversion  11/30/2011    Procedure: TRANSESOPHAGEAL ECHOCARDIOGRAM (TEE);  Surgeon: Sueanne Margarita, MD;  Location: [facility] ENDOSCOPY;  Service: Cardiovascular;  Laterality: N/A;  . Coronary artery bypass graft      x3 vessels  . Atrial flutter ablation  07-05-13    05-15-13  . Colonoscopy with propofol N/A 07/17/2013    Procedure: COLONOSCOPY WITH PROPOFOL;  Surgeon: Garlan Fair, MD;  Location: [facility] ENDOSCOPY;  Service: Endoscopy;  Laterality: N/A;  . Esophagogastroduodenoscopy (egd) with propofol N/A 07/17/2013    Procedure: ESOPHAGOGASTRODUODENOSCOPY (EGD) WITH PROPOFOL;  Surgeon: Garlan Fair, MD;  Location: [facility] ENDOSCOPY;  Service: Endoscopy;  Laterality: N/A;  . Atrial flutter ablation N/A 05/15/2013     Procedure: ATRIAL FLUTTER ABLATION;  Surgeon: Coralyn Mark, MD;  Location: [facility] CATH LAB;  Service: Cardiovascular;  Laterality: N/A;     Current Outpatient Prescriptions  Medication Sig Dispense Refill  . amiodarone (PACERONE) 200 MG tablet TAKE 1 TABLET BY MOUTH AT BEDTIME 30 tablet 6  . amLODipine (NORVASC) 5 MG tablet TAKE 1 TABLET BY MOUTH EVERY DAY 30 tablet 5  . furosemide (LASIX) 80 MG tablet TAKE 1.5 TABLETS (120 MG TOTAL) BY MOUTH 2 (TWO) TIMES DAILY. 90 tablet 5  . Glucosamine HCl (GLUCOSAMINE PO) Take 1 tablet by mouth daily. Joint Support    . Multiple Vitamins-Minerals (MULTIVITAMIN PO) Take 2-3 tablets by mouth daily.    Marland Kitchen omega-3 acid ethyl esters (LOVAZA) 1 G capsule Take 1 g by mouth 3 (three) times daily.    Marland Kitchen OVER THE COUNTER MEDICATION Take 1 tablet by mouth daily as needed (when ever he thinks he needs it). Ultimate prostate    . OVER THE COUNTER MEDICATION Take 1 tablet by mouth daily as needed (when ever he thinks he needs it). Male factor/Prelox Blue    . OVER THE COUNTER MEDICATION Take 1 tablet by mouth daily as needed (when ever he thinks he needs it). Schizandra/RoseGuard    . OVER THE COUNTER MEDICATION Take 1 tablet by mouth daily as needed (when ever he thinks he needs it). Snack Defense    . OVER THE COUNTER MEDICATION Take 1 tablet by mouth daily as needed (when ever he thinks he needs it).  Niteworks    . rosuvastatin (CRESTOR) 5 MG tablet Take 1 tablet (5 mg total) by mouth at bedtime. 90 tablet 1  . warfarin (COUMADIN) 5 MG tablet Take as directed by [facility] 30 tablet 2   No current facility-administered medications for this visit.    Allergies:   Review of patient's allergies indicates no known allergies.    Social History:  The patient  reports that he quit smoking about 36 years ago. His smoking use included Cigarettes. He started smoking about 45 years ago. He has a 1.3 pack-year smoking history. He has never used smokeless tobacco. He  reports that he drinks about 1.2 oz of alcohol per week. He reports that he does not use illicit drugs.   Family History:  The patient's family history includes Heart disease in his mother.    ROS:  Please see the history of present illness.   Otherwise, review of systems are positive for  Audible complaints including muscle aches, easy bruising, difficulty sleeping, frequent urination..   All other systems are reviewed and negative.    PHYSICAL EXAM: VS:  BP 160/62 mmHg  Pulse 64  Ht 5' 11"  (1.803 m)  Wt 98.485 kg (217 lb 1.9 oz)  BMI 30.30 kg/m2  SpO2 97% , BMI Body mass index is 30.3 kg/(m^2). GEN: Well nourished, well developed, in no acute distress HEENT: normal Neck: no JVD, but there is a faint left carotid bruit. No masses are noted. Cardiac: RRR.  There is 2/6 decrescendo murmur  Of aortic regurgitation. There is no rub, or gallop. There is no edema. Respiratory:  clear to auscultation bilaterally, normal work of breathing. GI: soft, nontender, nondistended, + BS MS: no deformity or atrophy Skin: warm and dry, no rash Neuro:  Strength and sensation are intact Psych: euthymic mood, full affect   EKG:  EKG is not ordered today.   Recent Labs: 12/02/2014: ALT 54*; TSH 1.20    Lipid Panel No results found for: CHOL, TRIG, HDL, CHOLHDL, VLDL, LDLCALC, LDLDIRECT    Wt Readings from Last 3 Encounters:  06/11/15 98.485 kg (217 lb 1.9 oz)  12/02/14 97.523 kg (215 lb)  05/01/14 99.392 kg (219 lb 1.9 oz)      Other studies Reviewed: Additional studies/ records that were reviewed today include:  Labs reviewed. Otherwise no new data.. The findings include  No new findings..    ASSESSMENT AND PLAN:  1. On amiodarone therapy  no obvious evidence of amiodarone toxicity - TSH - Comp Met (CMET)  2. Atrial flutter with rapid ventricular response (HCC)  no clinical recurrences. Usually associated with acute diastolic heart failure  3. Coronary artery disease involving  coronary bypass graft of native heart with other forms of angina pectoris ([place])  asymptomatic with reference to angina  4. Long term (current) use of anticoagulants  no bleeding on current anticoagulation regimen  5. Chronic diastolic heart failure (HCC)  no evidence of volume overload  6. CKD (chronic kidney disease) stage 3, GFR 30-59 ml/min  current status not known  7. Bioprosthetic aortic valve replacement during current hospitalization  mild aortic regurgitation is noted unchanged from prior clinical evaluation.  8. Bilateral carotid artery stenosis  follow clinically  9. PAD (peripheral artery disease) (HCC)  clinically stable claudication bilaterally     Current medicines are reviewed at length with the patient today.  The patient has the following concerns regarding medicines:  Diuretic regimen. Admits to taking his diuretic once a day most days.Marland Kitchen  The following changes/actions have been instituted:     encouraged compliance with diuretic regimen. He is currently taking 120 mg once per day most days rather than twice a day. I encouraged twice a day dosing as often as possible.   Encouraged aerobic activity , especially walking   TSH and Cmet  Labs/ tests ordered today include:   Orders Placed This Encounter  Procedures  . TSH  . Comp Met (CMET)     Disposition:   FU with HS in 6 months  Signed, Sinclair Grooms, MD  06/11/2015 3:39 PM    [address], [place], [place]  [address] Phone: [phone]; Fax: [phone]

## 2015-06-12 ENCOUNTER — Telehealth: Payer: Self-pay

## 2015-06-12 NOTE — Telephone Encounter (Signed)
Pt wife aware of lab results with verbal understanding. Stable with no evidence of toxicity  Pt wife sts that when pt bp was checked initially ay his o/v on 10/5 with Dr.Smith pt bp was read as 160/62. When pt bp was rechecked by Dr.Smith pt systolic bp was down to 825/00. Pt wife sts that their after visit summary reflect the higher bp and would like to have the bp reading corrected in the pt chart. Adv her that I cannot alter Dr.Smith's note, but I will fwd him the message.

## 2015-06-12 NOTE — Telephone Encounter (Signed)
-----   Message from Belva Crome, MD sent at 06/12/2015  7:22 AM EDT ----- Stable with no evidence of toxicity

## 2015-07-04 ENCOUNTER — Telehealth: Payer: Self-pay | Admitting: Interventional Cardiology

## 2015-07-04 NOTE — Telephone Encounter (Signed)
Wife calling (Mr. [name] [name] was present and gave permission to speak to her) stating she thinks he took extra dose of Amlodipine.  He goes to work at 5:30 AM and took his medication but apparently 30 min later couldn't remember if he had taken it so took another dose.  He came home at 8:00 and was weak, SOB and nauseated. At 9:00 BP was 133/72 HR 98; at 9:15 BP 144/70 HR 94.  She states now at 9:30 he is feeling much better.  No difficulty breathing.  Has been lying down sleeping off and on. Advised to watch him but think he will be stable from cardiac standpoint by taking extra Amlodipine.  Advised would speak w/DOD and if has any advice will call her back.  Spoke w/ [name] Rubin Gerhardt,NP/flex who states to watch him during day and if any changes should go to ER.  Wife would like [place] to call her back around 12:00.  Will call her back around noon.

## 2015-07-04 NOTE — Telephone Encounter (Signed)
Lm to call back to give BP readings.

## 2015-07-04 NOTE — Telephone Encounter (Signed)
Follow Up  Pt wife returned the call. She says that it is rather urgent. She believes that he will need to come back in.

## 2015-07-04 NOTE — Telephone Encounter (Signed)
Wife returning my call.  She states she feels like he needs to be seen today. His BP at 10:15 was 111/60 HR 78; at 1:45 123/60 HR 72 and BP now at 2:30 is 123/60 HR 72.  Slight SOB when walks but has mostly been sleeping.  Spoke w/Lori Gerhardt,NP/flex who states BP is good and he is on Coumadin which covers for stroke and if didn't feel better to go to ER or urgent care.  Spoke w/wife again and tried to reassure her that because he took the extra dose of Amlodipine his BP would be lower today and if he is use to BP being 160-150/ could tell difference that would make him tired.  Reassured her that he is on Coumadin which should cover for stroke.  Advised her to continue to monitor and if doesn't feel better to take him to urgent care. She verbalizes understanding and will monitor.

## 2015-07-04 NOTE — Telephone Encounter (Signed)
New Message   Pt wants to talk to RN or Dr. Tamala Julian   She said it is very urgent  (I asked her) she did not explain what makes it urgent  She want to make sure Dr. Tamala Julian is aware that she have called  She states that she don't want RN to call her back hours later   Like around 4 she wants a call soon as possible  (i asked for more information this is all she gave me)

## 2015-07-07 ENCOUNTER — Telehealth: Payer: Self-pay | Admitting: Interventional Cardiology

## 2015-07-07 ENCOUNTER — Ambulatory Visit (INDEPENDENT_AMBULATORY_CARE_PROVIDER_SITE_OTHER): Payer: Medicare Other | Admitting: Pharmacist

## 2015-07-07 ENCOUNTER — Encounter: Payer: Self-pay | Admitting: Nurse Practitioner

## 2015-07-07 ENCOUNTER — Ambulatory Visit (INDEPENDENT_AMBULATORY_CARE_PROVIDER_SITE_OTHER): Payer: Medicare Other | Admitting: Nurse Practitioner

## 2015-07-07 VITALS — BP 142/60 | HR 70 | Ht 71.0 in | Wt 214.0 lb

## 2015-07-07 DIAGNOSIS — I4892 Unspecified atrial flutter: Secondary | ICD-10-CM

## 2015-07-07 DIAGNOSIS — N183 Chronic kidney disease, stage 3 unspecified: Secondary | ICD-10-CM

## 2015-07-07 DIAGNOSIS — I5032 Chronic diastolic (congestive) heart failure: Secondary | ICD-10-CM | POA: Diagnosis not present

## 2015-07-07 DIAGNOSIS — I25810 Atherosclerosis of coronary artery bypass graft(s) without angina pectoris: Secondary | ICD-10-CM

## 2015-07-07 DIAGNOSIS — I6523 Occlusion and stenosis of bilateral carotid arteries: Secondary | ICD-10-CM

## 2015-07-07 DIAGNOSIS — Z7901 Long term (current) use of anticoagulants: Secondary | ICD-10-CM | POA: Diagnosis not present

## 2015-07-07 DIAGNOSIS — Z79899 Other long term (current) drug therapy: Secondary | ICD-10-CM | POA: Diagnosis not present

## 2015-07-07 LAB — POCT INR: INR: 3.2

## 2015-07-07 NOTE — Progress Notes (Signed)
CARDIOLOGY OFFICE NOTE  Date:  07/07/2015    [name] [name] Date of Birth: 05-19-1941 Medical Record #[id number]  PCP:  No PCP Per Patient  Cardiologist:  Tamala Julian    Chief Complaint  Patient presents with  . Irregular Heart Beat    Work in visit - seen for Dr. Tamala Julian  . Shortness of Breath    History of Present Illness: [name] [name] is a 74 y.o. male who presents today for a work in visit. Seen for Dr. Tamala Julian. He has a history of diastolic heart failure, paroxysmal atrial fibrillation/flutter, coronary artery disease with prior CABG x 3 with LIMA to LAD, SVG to RC, SVG to OM, AVR with #23 mm Magna Ease pericardial tissue valve per Dr. Cyndia Bent, PAD with claudication,essential hypertension, chronic anticoagulation therapy, & chronic amiodarone therapy.  Seen earlier this month - felt to be stable but not compliant with his diuretic regimen.   Phone call on Friday - had mistakenly taken extra dose of Norvasc. BP did ok. HR was fine.   Phone call today:  Returned pt wife call. She sts that pt has been having increased son and fatigue since Friday. Pt left work early on Fri, because he felt weak. Pt wife made several calls to our office. Pt wife sts that the pt was seen by urgent care on 10/29, nothing was done and they are not confident with urgent cares evaluation. Pt wife sts that pt is still having increased sob, pt complains of orthopnea, pt is complaining of feeling tired and weak like his heart is out of rhythm. Adv pt wife that Dr.Smith is out of the office, I will talk with another provider and call back with their recommendation. Pt wife verbalized understanding  Adv pt wife that I have discussed pt symptoms with Tera Helper, NP (flex), we can add pt to her flex scheduled today @ 1:30pm. Pt wife voiced appreciation, pt will come on today to see Cecille Rubin at 1:30pm          Thus added to my schedule for today.  Comes in today. Here with his wife. History is a little hard  to follow. He says he has not felt well for a week - she says since Friday. He is "losing his breath". He can't lay down at night. Feels nauseated. Not really clear if he is short of breath or not with lying down. Does not think he has had chest pain. Wife notes he is just weak and with no energy. Went to an Urgent Care - did not have faith in anything done - said had CXR that showed "coronary disease". His weight is down. No swelling. Seems constipated. Wife has been giving him more Lasix over the weekend - before that he would "skip" doses.    Past Medical History  Diagnosis Date  . Coronary artery disease   . Angina   . Hypertension   . Atrial flutter ([place])   . Obstructive sleep apnea     severe,states had test, no machine, used oxygen in past, not now    Past Surgical History  Procedure Laterality Date  . Cardiac valve replacement      aortic valve replacement (bioprosthetic)  . Tee without cardioversion  11/30/2011    Procedure: TRANSESOPHAGEAL ECHOCARDIOGRAM (TEE);  Surgeon: Sueanne Margarita, MD;  Location: [facility] ENDOSCOPY;  Service: Cardiovascular;  Laterality: N/A;  . Coronary artery bypass graft      x3 vessels  . Atrial flutter ablation  07-05-13    05-15-13  . Colonoscopy with propofol N/A 07/17/2013    Procedure: COLONOSCOPY WITH PROPOFOL;  Surgeon: Garlan Fair, MD;  Location: [facility] ENDOSCOPY;  Service: Endoscopy;  Laterality: N/A;  . Esophagogastroduodenoscopy (egd) with propofol N/A 07/17/2013    Procedure: ESOPHAGOGASTRODUODENOSCOPY (EGD) WITH PROPOFOL;  Surgeon: Garlan Fair, MD;  Location: [facility] ENDOSCOPY;  Service: Endoscopy;  Laterality: N/A;  . Atrial flutter ablation N/A 05/15/2013    Procedure: ATRIAL FLUTTER ABLATION;  Surgeon: Coralyn Mark, MD;  Location: [place] CATH LAB;  Service: Cardiovascular;  Laterality: N/A;     Medications: Current Outpatient Prescriptions  Medication Sig Dispense Refill  . amiodarone (PACERONE) 200 MG tablet TAKE 1 TABLET BY MOUTH AT BEDTIME  30 tablet 6  . amLODipine (NORVASC) 5 MG tablet TAKE 1 TABLET BY MOUTH EVERY DAY 30 tablet 5  . furosemide (LASIX) 80 MG tablet TAKE 1.5 TABLETS (120 MG TOTAL) BY MOUTH 2 (TWO) TIMES DAILY. 90 tablet 5  . Glucosamine HCl (GLUCOSAMINE PO) Take 1 tablet by mouth daily. Joint Support    . Multiple Vitamins-Minerals (MULTIVITAMIN PO) Take 2-3 tablets by mouth daily.    Marland Kitchen omega-3 acid ethyl esters (LOVAZA) 1 G capsule Take 1 g by mouth 3 (three) times daily.    Marland Kitchen OVER THE COUNTER MEDICATION Take 1 tablet by mouth daily as needed (when ever he thinks he needs it). Ultimate prostate    . OVER THE COUNTER MEDICATION Take 1 tablet by mouth daily as needed (when ever he thinks he needs it). Male factor/Prelox Blue    . OVER THE COUNTER MEDICATION Take 1 tablet by mouth daily as needed (when ever he thinks he needs it). Schizandra/RoseGuard    . OVER THE COUNTER MEDICATION Take 1 tablet by mouth daily as needed (when ever he thinks he needs it). Snack Defense    . OVER THE COUNTER MEDICATION Take 1 tablet by mouth daily as needed (when ever he thinks he needs it). Niteworks    . rosuvastatin (CRESTOR) 5 MG tablet Take 1 tablet (5 mg total) by mouth at bedtime. 90 tablet 1  . warfarin (COUMADIN) 5 MG tablet Take as directed by [facility] 30 tablet 2   No current facility-administered medications for this visit.    Allergies: No Known Allergies  Social History: The patient  reports that he quit smoking about 36 years ago. His smoking use included Cigarettes. He started smoking about 46 years ago. He has a 1.3 pack-year smoking history. He has never used smokeless tobacco. He reports that he drinks about 1.2 oz of alcohol per week. He reports that he does not use illicit drugs.   Family History: The patient's family history includes Heart disease in his mother.   Review of Systems: Please see the history of present illness.   Otherwise, the review of systems is positive for waking up at night short  of breath and shortness of breath with lying down.   All other systems are reviewed and negative.   Physical Exam: VS:  BP 142/60 mmHg  Pulse 70  Ht 5\' 11"  (1.803 m)  Wt 214 lb (97.07 kg)  BMI 29.86 kg/m2 .  BMI Body mass index is 29.86 kg/(m^2).  Wt Readings from Last 3 Encounters:  07/07/15 214 lb (97.07 kg)  06/11/15 217 lb 1.9 oz (98.485 kg)  12/02/14 215 lb (97.523 kg)    General: Pleasant. Little slow to answer but in no acute distress.  HEENT: Normal. Neck: Supple, no  JVD, carotid bruits, or masses noted.  Cardiac: Regular rate and rhythm. Outflow murmur noted. No edema.  Respiratory:  Lungs are clear to auscultation bilaterally with normal work of breathing.  GI: Soft and nontender.  MS: No deformity or atrophy. Gait and ROM intact. Skin: Warm and dry. Color is normal.  Neuro:  Strength and sensation are intact and no gross focal deficits noted.  Psych: Alert, appropriate and with normal affect.   LABORATORY DATA:  EKG:  EKG is ordered today. This demonstrates NSR with 1st degree AV block, PVCs and inferolateral T wave changes which are new compared to last tracing.  Lab Results  Component Value Date   WBC 8.5 08/07/2013   HGB 11.4* 08/07/2013   HCT 34.7* 08/07/2013   PLT 242.0 08/07/2013   GLUCOSE 105* 06/11/2015   ALT 44 06/11/2015   AST 44* 06/11/2015   NA 134* 06/11/2015   K 4.4 06/11/2015   CL 98 06/11/2015   CREATININE 1.51* 06/11/2015   BUN 25 06/11/2015   CO2 26 06/11/2015   TSH 1.196 06/11/2015   INR 3.1 06/03/2015   HGBA1C 6.8* 05/11/2013   Lab Results  Component Value Date   INR 3.1 06/03/2015   INR 2.7 04/29/2015   INR 2.8 03/24/2015     BNP (last 3 results) No results for input(s): BNP in the last 8760 hours.  ProBNP (last 3 results) No results for input(s): PROBNP in the last 8760 hours.   Other Studies Reviewed Today:  Echo Study Conclusions from 05/2013  - Left ventricle: The cavity size was normal. There was moderate  concentric hypertrophy. Systolic function was normal. Wall motion was normal; there were no regional wall motion abnormalities. There was a reduced contribution of atrial contraction to ventricular filling, due to increased ventricular diastolic pressure or atrial contractile dysfunction. Doppler parameters are consistent with a reversible restrictive pattern, indicative of decreased left ventricular diastolic compliance and/or increased left atrial pressure (grade 3 diastolic dysfunction). Doppler parameters are consistent with high ventricular filling pressure. - Aortic valve: A bioprosthesis was present. Moderate regurgitation. Valve area: 2.22cm^2(VTI). Valve area: 2.38cm^2 (Vmax). - Mitral valve: Calcified annulus. Mildly thickened leaflets . Mild to moderate regurgitation. - Left atrium: The atrium was moderately dilated. - Pulmonary arteries: PA peak pressure: 2mm Hg (S).   Cardiac Cath CONCLUSIONS from 2010: 1. Mild-to-moderate aortic stenosis by hemodynamic data. 2. Severe multivessel coronary disease including left main. 3. Mild left ventricular dysfunction with anteroapical hypokinesis. 4. Normal right heart pressures.  PLAN: Referral for coronary artery bypass grafting. I believe we should relook at the patient's aortic valve here by echo in [place] to make a final decision about whether any treatment is necessary on the valve. The hemodynamic data from cath would suggest that aortic valve surgery would not be indicated at this time.   Belva Crome, M.D.   Assessment/Plan:  1. High risk amiodarone therapy no obvious evidence of amiodarone toxicity  surveillance labs done earlier this month stable - will recheck today. If his cardiac tests are negative - he will need PFTs with diffusion capacity.   2. Atrial flutter with rapid ventricular response (HCC) no clinical recurrences. He is in NSR by EKG and exam today.  Usually associated with acute diastolic heart failure  3. Coronary artery disease involving coronary bypass graft of native heart without other forms of angina pectoris ([place]) he is short of breath. No chest pain - he is not sure if he has ever had chest pain. Needs  Myoview and echo. Resting EKG today with inferolateral T wave changes.  4. Long term (current) use of anticoagulants no bleeding on current anticoagulation regimen Checking INR today  5. Chronic diastolic heart failure (HCC) no evidence of volume overload Weight is down. Wife has increased his Lasix. Needs labs today.   6. CKD (chronic kidney disease) stage 3, GFR 30-59 ml/min checking labs today  7. Bioprosthetic aortic valve replacement  needs his echo updated.   8. Bilateral carotid artery stenosis follow clinically  9. PAD (peripheral artery disease) ([place]) not discussed today.  10. DM  Current medicines are reviewed with the patient today.  The patient does not have concerns regarding medicines other than what has been noted above.  The following changes have been made:  See above.  Labs/ tests ordered today include:    Orders Placed This Encounter  Procedures  . Brain natriuretic peptide  . Basic metabolic panel  . CBC  . Hepatic function panel  . TSH  . Myocardial Perfusion Imaging  . EKG 12-Lead  . ECHOCARDIOGRAM COMPLETE     Disposition:   FU based on tests results. Labs today. Arrange for Myoview/Echo. Further disposition to follow.   Patient is agreeable to this plan and will call if any problems develop in the interim.   Signed: Burtis Junes, RN, ANP-C 07/07/2015 2:03 PM  [facility] [address] [place], [place]  [address] Phone: [phone] Fax: [phone]

## 2015-07-07 NOTE — Telephone Encounter (Signed)
Returned pt wife call. She sts that pt has been having increased son and fatigue since Friday. Pt left work early on Fri, because he felt weak. Pt wife made several calls to our office. Pt wife sts that the pt was seen by urgent care on 10/29, nothing was done and they are not confident with urgent cares evaluation. Pt wife sts that pt is still having increased sob, pt complains of orthopnea, pt is complaining of feeling tired and weak like his heart is out of rhythm. Adv pt wife that Dr.Smith is out of the office, I will talk with another provider and call back with their recommendation. Pt wife verbalized understanding  Adv pt wife that I have discussed pt symptoms with Tera Helper, NP (flex), we can add pt to her flex scheduled today @ 1:30pm. Pt wife voiced appreciation, pt will come on today to see Cecille Rubin at 1:30pm

## 2015-07-07 NOTE — Patient Instructions (Addendum)
We will be checking the following labs today - BMET, CBC, BNP, HPF and TSH   Medication Instructions:    Continue with your current medicines for now    Testing/Procedures To Be Arranged:  Lexiscan Myoview  Echocardiogram  Follow-Up:   We will see how your tests turn out and then decide about your follow up.     Other Special Instructions:   N/A    If you need a refill on your cardiac medications before your next appointment, please call your pharmacy.   Call the [place] office at [phone] if you have any questions, problems or concerns.

## 2015-07-07 NOTE — Telephone Encounter (Signed)
Pt c/o Shortness Of Breath: STAT if SOB developed within the last 24 hours or pt is noticeably SOB on the phone  1. Are you currently SOB (can you hear that pt is SOB on the phone)? No  2. How long have you been experiencing SOB? Since Friday  3. Are you SOB when sitting or when up moving around? When up moving around and mostly at night  4. Are you currently experiencing any other symptoms? Weakness/no energy

## 2015-07-08 ENCOUNTER — Ambulatory Visit (HOSPITAL_COMMUNITY)
Admission: RE | Admit: 2015-07-08 | Discharge: 2015-07-08 | Disposition: A | Discharge status: Home / Self Care | Payer: Medicare Other | Source: Ambulatory Visit | Attending: Internal Medicine | Admitting: Internal Medicine

## 2015-07-08 ENCOUNTER — Telehealth: Payer: Self-pay | Admitting: *Deleted

## 2015-07-08 DIAGNOSIS — R0609 Other forms of dyspnea: Secondary | ICD-10-CM | POA: Insufficient documentation

## 2015-07-08 DIAGNOSIS — I25810 Atherosclerosis of coronary artery bypass graft(s) without angina pectoris: Secondary | ICD-10-CM | POA: Diagnosis present

## 2015-07-08 DIAGNOSIS — I5032 Chronic diastolic (congestive) heart failure: Secondary | ICD-10-CM | POA: Diagnosis not present

## 2015-07-08 DIAGNOSIS — I129 Hypertensive chronic kidney disease with stage 1 through stage 4 chronic kidney disease, or unspecified chronic kidney disease: Secondary | ICD-10-CM | POA: Insufficient documentation

## 2015-07-08 DIAGNOSIS — R0602 Shortness of breath: Secondary | ICD-10-CM | POA: Diagnosis not present

## 2015-07-08 DIAGNOSIS — I739 Peripheral vascular disease, unspecified: Secondary | ICD-10-CM | POA: Diagnosis not present

## 2015-07-08 DIAGNOSIS — R9439 Abnormal result of other cardiovascular function study: Secondary | ICD-10-CM | POA: Diagnosis not present

## 2015-07-08 DIAGNOSIS — Z7901 Long term (current) use of anticoagulants: Secondary | ICD-10-CM | POA: Diagnosis not present

## 2015-07-08 DIAGNOSIS — Z79899 Other long term (current) drug therapy: Secondary | ICD-10-CM | POA: Diagnosis not present

## 2015-07-08 DIAGNOSIS — Z8249 Family history of ischemic heart disease and other diseases of the circulatory system: Secondary | ICD-10-CM | POA: Diagnosis not present

## 2015-07-08 DIAGNOSIS — N183 Chronic kidney disease, stage 3 unspecified: Secondary | ICD-10-CM

## 2015-07-08 DIAGNOSIS — I517 Cardiomegaly: Secondary | ICD-10-CM | POA: Diagnosis not present

## 2015-07-08 DIAGNOSIS — R5383 Other fatigue: Secondary | ICD-10-CM | POA: Insufficient documentation

## 2015-07-08 LAB — HEPATIC FUNCTION PANEL
ALT: 54 U/L — ABNORMAL HIGH (ref 9–46)
AST: 54 U/L — ABNORMAL HIGH (ref 10–35)
Albumin: 3.6 g/dL (ref 3.6–5.1)
Alkaline Phosphatase: 83 U/L (ref 40–115)
Bilirubin, Direct: 0.4 mg/dL — ABNORMAL HIGH (ref <=0.2)
Indirect Bilirubin: 1 mg/dL (ref 0.2–1.2)
Total Bilirubin: 1.4 mg/dL — ABNORMAL HIGH (ref 0.2–1.2)
Total Protein: 7.1 g/dL (ref 6.1–8.1)

## 2015-07-08 LAB — BASIC METABOLIC PANEL
BUN: 41 mg/dL — ABNORMAL HIGH (ref 7–25)
CO2: 26 mmol/L (ref 20–31)
Calcium: 9 mg/dL (ref 8.6–10.3)
Chloride: 95 mmol/L — ABNORMAL LOW (ref 98–110)
Creat: 1.73 mg/dL — ABNORMAL HIGH (ref 0.70–1.18)
Glucose, Bld: 153 mg/dL — ABNORMAL HIGH (ref 65–99)
Potassium: 3.7 mmol/L (ref 3.5–5.3)
Sodium: 133 mmol/L — ABNORMAL LOW (ref 135–146)

## 2015-07-08 LAB — MYOCARDIAL PERFUSION IMAGING
LV dias vol: 339 mL
LV sys vol: 236 mL
Peak HR: 69 {beats}/min
Rest HR: 65 {beats}/min
SDS: 6
SRS: 17
SSS: 23
TID: 1.1

## 2015-07-08 LAB — BRAIN NATRIURETIC PEPTIDE: Brain Natriuretic Peptide: 657.2 pg/mL — ABNORMAL HIGH (ref 0.0–100.0)

## 2015-07-08 LAB — CBC
HCT: 32.4 % — ABNORMAL LOW (ref 39.0–52.0)
Hemoglobin: 10 g/dL — ABNORMAL LOW (ref 13.0–17.0)
MCH: 25.8 pg — ABNORMAL LOW (ref 26.0–34.0)
MCHC: 30.9 g/dL (ref 30.0–36.0)
MCV: 83.5 fL (ref 78.0–100.0)
MPV: 9.8 fL (ref 8.6–12.4)
Platelets: 377 10*3/uL (ref 150–400)
RBC: 3.88 MIL/uL — ABNORMAL LOW (ref 4.22–5.81)
RDW: 17.4 % — ABNORMAL HIGH (ref 11.5–15.5)
WBC: 8 10*3/uL (ref 4.0–10.5)

## 2015-07-08 LAB — TSH: TSH: 0.902 u[IU]/mL (ref 0.350–4.500)

## 2015-07-08 MED ORDER — TECHNETIUM TC 99M SESTAMIBI GENERIC - CARDIOLITE
30.4000 | Freq: Once | INTRAVENOUS | Status: AC | PRN
  Administered 2015-07-08: 30.4 via INTRAVENOUS

## 2015-07-08 MED ORDER — TECHNETIUM TC 99M SESTAMIBI GENERIC - CARDIOLITE
10.8000 | Freq: Once | INTRAVENOUS | Status: AC | PRN
  Administered 2015-07-08: 10.8 via INTRAVENOUS

## 2015-07-08 MED ORDER — REGADENOSON 0.4 MG/5ML IV SOLN
0.4000 mg | Freq: Once | INTRAVENOUS | Status: AC
  Administered 2015-07-08: 0.4 mg via INTRAVENOUS

## 2015-07-08 NOTE — Telephone Encounter (Signed)
-----   Message from Burtis Junes, NP sent at 07/08/2015  1:17 PM EDT ----- Please call - may need to speak with his wife - his stress test is abnormal. Has reduction in his pumping function. Will need cardiac catheterization.  I can see him at 9 am tomorrow to discuss and arrange - or he can be seen on the FLEX - regardless - has to be seen in the next 1 to 2 days

## 2015-07-08 NOTE — Telephone Encounter (Signed)
Called pt's wife and advised her of the pt's high risk stress test and that the pt possibly would need a catherization and that we needed to see the pt back in the office 07-09-15 @ 9:00 with Truitt Merle, PA-C to discuss the results and set the procedure up.  Pt's wife asked if it could wait, and she was advised that this could not wait and pt really needed to have this taken care of.  She verbalized understanding.

## 2015-07-09 ENCOUNTER — Other Ambulatory Visit: Payer: Self-pay

## 2015-07-09 ENCOUNTER — Encounter: Payer: Self-pay | Admitting: Nurse Practitioner

## 2015-07-09 ENCOUNTER — Other Ambulatory Visit: Payer: Self-pay | Admitting: Nurse Practitioner

## 2015-07-09 ENCOUNTER — Ambulatory Visit (HOSPITAL_COMMUNITY): Payer: Medicare Other | Attending: Cardiology

## 2015-07-09 ENCOUNTER — Ambulatory Visit (INDEPENDENT_AMBULATORY_CARE_PROVIDER_SITE_OTHER): Payer: Medicare Other | Admitting: Nurse Practitioner

## 2015-07-09 ENCOUNTER — Telehealth: Payer: Self-pay

## 2015-07-09 VITALS — BP 148/48 | HR 83 | Ht 71.0 in | Wt 216.4 lb

## 2015-07-09 DIAGNOSIS — I351 Nonrheumatic aortic (valve) insufficiency: Secondary | ICD-10-CM | POA: Insufficient documentation

## 2015-07-09 DIAGNOSIS — R9439 Abnormal result of other cardiovascular function study: Secondary | ICD-10-CM

## 2015-07-09 DIAGNOSIS — I10 Essential (primary) hypertension: Secondary | ICD-10-CM | POA: Diagnosis not present

## 2015-07-09 DIAGNOSIS — R0600 Dyspnea, unspecified: Secondary | ICD-10-CM | POA: Diagnosis present

## 2015-07-09 DIAGNOSIS — Z79899 Other long term (current) drug therapy: Secondary | ICD-10-CM | POA: Diagnosis not present

## 2015-07-09 DIAGNOSIS — N183 Chronic kidney disease, stage 3 unspecified: Secondary | ICD-10-CM

## 2015-07-09 DIAGNOSIS — Z952 Presence of prosthetic heart valve: Secondary | ICD-10-CM | POA: Insufficient documentation

## 2015-07-09 DIAGNOSIS — I5032 Chronic diastolic (congestive) heart failure: Secondary | ICD-10-CM

## 2015-07-09 DIAGNOSIS — I25810 Atherosclerosis of coronary artery bypass graft(s) without angina pectoris: Secondary | ICD-10-CM

## 2015-07-09 DIAGNOSIS — Z951 Presence of aortocoronary bypass graft: Secondary | ICD-10-CM | POA: Diagnosis not present

## 2015-07-09 DIAGNOSIS — I340 Nonrheumatic mitral (valve) insufficiency: Secondary | ICD-10-CM | POA: Insufficient documentation

## 2015-07-09 DIAGNOSIS — Z7901 Long term (current) use of anticoagulants: Secondary | ICD-10-CM

## 2015-07-09 DIAGNOSIS — I6523 Occlusion and stenosis of bilateral carotid arteries: Secondary | ICD-10-CM

## 2015-07-09 DIAGNOSIS — R931 Abnormal findings on diagnostic imaging of heart and coronary circulation: Secondary | ICD-10-CM

## 2015-07-09 DIAGNOSIS — I517 Cardiomegaly: Secondary | ICD-10-CM | POA: Insufficient documentation

## 2015-07-09 LAB — BASIC METABOLIC PANEL
BUN: 37 mg/dL — ABNORMAL HIGH (ref 7–25)
CO2: 26 mmol/L (ref 20–31)
Calcium: 8.6 mg/dL (ref 8.6–10.3)
Chloride: 98 mmol/L (ref 98–110)
Creat: 1.85 mg/dL — ABNORMAL HIGH (ref 0.70–1.18)
Glucose, Bld: 146 mg/dL — ABNORMAL HIGH (ref 65–99)
Potassium: 3.6 mmol/L (ref 3.5–5.3)
Sodium: 133 mmol/L — ABNORMAL LOW (ref 135–146)

## 2015-07-09 LAB — CBC
HCT: 30.7 % — ABNORMAL LOW (ref 39.0–52.0)
Hemoglobin: 9.3 g/dL — ABNORMAL LOW (ref 13.0–17.0)
MCH: 25.3 pg — ABNORMAL LOW (ref 26.0–34.0)
MCHC: 30.3 g/dL (ref 30.0–36.0)
MCV: 83.4 fL (ref 78.0–100.0)
MPV: 9.7 fL (ref 8.6–12.4)
Platelets: 344 10*3/uL (ref 150–400)
RBC: 3.68 MIL/uL — ABNORMAL LOW (ref 4.22–5.81)
RDW: 17.4 % — ABNORMAL HIGH (ref 11.5–15.5)
WBC: 6.7 10*3/uL (ref 4.0–10.5)

## 2015-07-09 LAB — PROTIME-INR
INR: 2.67 — ABNORMAL HIGH (ref <1.50)
Prothrombin Time: 28.8 seconds — ABNORMAL HIGH (ref 11.6–15.2)

## 2015-07-09 LAB — APTT: aPTT: 54 seconds — ABNORMAL HIGH (ref 24–37)

## 2015-07-09 NOTE — Telephone Encounter (Signed)
-----   Message from Burtis Junes, NP sent at 07/09/2015  4:01 PM EDT ----- Ok to report. He remains anemic. Still with kidney impairment.  Hold medicines as advised today at his visit.  Will be rechecking INR on Tuesday prior to cath.

## 2015-07-09 NOTE — Progress Notes (Signed)
CARDIOLOGY OFFICE NOTE  Date:  07/09/2015    [name] [name] Date of Birth: 1941/06/08 Medical Record #[id number]  PCP:  No PCP Per Patient  Cardiologist:  Tamala Julian    Chief Complaint  Patient presents with  . Coronary Artery Disease    Pre cath visit - seen for Dr. Tamala Julian  . Congestive Heart Failure  . Atrial Fibrillation    History of Present Illness: [name] [name] is a 74 y.o. male who presents today for a pre cath/follow up visit. Seen for Dr. Tamala Julian.   He has a history of diastolic heart failure, paroxysmal atrial fibrillation/flutter, coronary artery disease with prior CABG x 3 with LIMA to LAD, SVG to RC, SVG to OM, AVR with #23 mm Magna Ease pericardial tissue valve per Dr. Cyndia Bent back in 2010, PAD with claudication,essential hypertension, chronic anticoagulation therapy with coumadin, & chronic amiodarone therapy for PAF/flutter.  Seen back in October - felt to be stable but not compliant with his diuretic regimen.   Phone call on last Friday - had mistakenly taken extra dose of Norvasc. BP did ok. HR was fine.   Phone call back on Monday:  Returned pt wife call. She sts that pt has been having increased son and fatigue since Friday. Pt left work early on Fri, because he felt weak. Pt wife made several calls to our office. Pt wife sts that the pt was seen by urgent care on 10/29, nothing was done and they are not confident with urgent cares evaluation. Pt wife sts that pt is still having increased sob, pt complains of orthopnea, pt is complaining of feeling tired and weak like his heart is out of rhythm. Adv pt wife that Dr.Smith is out of the office, I will talk with another provider and call back with their recommendation. Pt wife verbalized understanding  Adv pt wife that I have discussed pt symptoms with Tera Helper, NP (flex), we can add pt to her flex scheduled today @ 1:30pm. Pt wife voiced appreciation, pt will come on today to see Cecille Rubin at 1:30pm            I then saw him this past Monday - he was "losing his breath". No energy. Weight was down. No swelling. Wife had been giving him more Lasix. I ordered a Myoview and echo. EKG was abnormal.   Myoview yesterday was high risk - brought back now to discuss cardiac cath. He has not had his echo yet. EF has fallen by Myoview and with large partially reversible anterior lateral defect. Last echo from 2014 showed normal LV function.         Comes back today. Here with his wife. He felt a little better yesterday - not really clear as to why. Slept better last night.    Past Medical History  Diagnosis Date  . Coronary artery disease   . Angina   . Hypertension   . Atrial flutter ([place])   . Obstructive sleep apnea     severe,states had test, no machine, used oxygen in past, not now    Past Surgical History  Procedure Laterality Date  . Cardiac valve replacement      aortic valve replacement (bioprosthetic)  . Tee without cardioversion  11/30/2011    Procedure: TRANSESOPHAGEAL ECHOCARDIOGRAM (TEE);  Surgeon: Sueanne Margarita, MD;  Location: [facility] ENDOSCOPY;  Service: Cardiovascular;  Laterality: N/A;  . Coronary artery bypass graft      x3 vessels  . Atrial flutter  ablation  07-05-13    05-15-13  . Colonoscopy with propofol N/A 07/17/2013    Procedure: COLONOSCOPY WITH PROPOFOL;  Surgeon: Garlan Fair, MD;  Location: [facility] ENDOSCOPY;  Service: Endoscopy;  Laterality: N/A;  . Esophagogastroduodenoscopy (egd) with propofol N/A 07/17/2013    Procedure: ESOPHAGOGASTRODUODENOSCOPY (EGD) WITH PROPOFOL;  Surgeon: Garlan Fair, MD;  Location: [facility] ENDOSCOPY;  Service: Endoscopy;  Laterality: N/A;  . Atrial flutter ablation N/A 05/15/2013    Procedure: ATRIAL FLUTTER ABLATION;  Surgeon: Coralyn Mark, MD;  Location: [facility] CATH LAB;  Service: Cardiovascular;  Laterality: N/A;     Medications: Current Outpatient Prescriptions  Medication Sig Dispense Refill  . amiodarone (PACERONE) 200 MG tablet TAKE 1  TABLET BY MOUTH AT BEDTIME 30 tablet 6  . amLODipine (NORVASC) 5 MG tablet TAKE 1 TABLET BY MOUTH EVERY DAY 30 tablet 5  . furosemide (LASIX) 80 MG tablet TAKE 1.5 TABLETS (120 MG TOTAL) BY MOUTH 2 (TWO) TIMES DAILY. 90 tablet 5  . Glucosamine HCl (GLUCOSAMINE PO) Take 1 tablet by mouth daily. Joint Support    . Multiple Vitamins-Minerals (MULTIVITAMIN PO) Take 2-3 tablets by mouth daily.    Marland Kitchen omega-3 acid ethyl esters (LOVAZA) 1 G capsule Take 1 g by mouth 3 (three) times daily.    Marland Kitchen OVER THE COUNTER MEDICATION Take 1 tablet by mouth daily as needed (when ever he thinks he needs it). Ultimate prostate    . OVER THE COUNTER MEDICATION Take 1 tablet by mouth daily as needed (when ever he thinks he needs it). Male factor/Prelox Blue    . OVER THE COUNTER MEDICATION Take 1 tablet by mouth daily as needed (when ever he thinks he needs it). Schizandra/RoseGuard    . OVER THE COUNTER MEDICATION Take 1 tablet by mouth daily as needed (when ever he thinks he needs it). Snack Defense    . OVER THE COUNTER MEDICATION Take 1 tablet by mouth daily as needed (when ever he thinks he needs it). Niteworks    . rosuvastatin (CRESTOR) 5 MG tablet Take 1 tablet (5 mg total) by mouth at bedtime. 90 tablet 1  . warfarin (COUMADIN) 5 MG tablet Take as directed by [facility] 30 tablet 2   No current facility-administered medications for this visit.    Allergies: No Known Allergies  Social History: The patient  reports that he quit smoking about 36 years ago. His smoking use included Cigarettes. He started smoking about 46 years ago. He has a 1.3 pack-year smoking history. He has never used smokeless tobacco. He reports that he drinks about 1.2 oz of alcohol per week. He reports that he does not use illicit drugs.   Family History: The patient's family history includes Heart disease in his mother.   Review of Systems: Please see the history of present illness.   Otherwise, the review of systems is positive  for none.   All other systems are reviewed and negative.   Physical Exam: VS:  BP 148/48 mmHg  Pulse 83  Ht 5\' 11"  (1.803 m)  Wt 216 lb 6.4 oz (98.158 kg)  BMI 30.19 kg/m2  SpO2 97% .  BMI Body mass index is 30.19 kg/(m^2).  Wt Readings from Last 3 Encounters:  07/09/15 216 lb 6.4 oz (98.158 kg)  07/08/15 214 lb (97.07 kg)  07/07/15 214 lb (97.07 kg)    General: Pleasant. Well developed, well nourished and in no acute distress.  HEENT: Normal. Neck: Supple, no JVD, carotid bruits, or masses noted.  Cardiac: Regular rate and rhythm. No murmurs, rubs, or gallops. No edema.  Respiratory:  Lungs are clear to auscultation bilaterally with normal work of breathing.  GI: Soft and nontender.  MS: No deformity or atrophy. Gait and ROM intact. Skin: Warm and dry. Color is normal.  Neuro:  Strength and sensation are intact and no gross focal deficits noted.  Psych: Alert, appropriate and with normal affect.   LABORATORY DATA:  EKG:  EKG is not ordered today.  Lab Results  Component Value Date   WBC 8.0 07/08/2015   HGB 10.0* 07/08/2015   HCT 32.4* 07/08/2015   PLT 377 07/08/2015   GLUCOSE 153* 07/08/2015   ALT 54* 07/08/2015   AST 54* 07/08/2015   NA 133* 07/08/2015   K 3.7 07/08/2015   CL 95* 07/08/2015   CREATININE 1.73* 07/08/2015   BUN 41* 07/08/2015   CO2 26 07/08/2015   TSH 0.902 07/08/2015   INR 3.2 07/07/2015   HGBA1C 6.8* 05/11/2013    BNP (last 3 results) No results for input(s): BNP in the last 8760 hours.  ProBNP (last 3 results) No results for input(s): PROBNP in the last 8760 hours.   Other Studies Reviewed Today: Study Highlights     Nuclear stress EF: 30%.  The left ventricular ejection fraction is moderately decreased (30-44%).  There was no ST segment deviation noted during stress.  This is a high risk study.  Findings consistent with prior myocardial infarction with peri-infarct ischemia.  High risk stress nuclear study with a large,  severe, partially reversible anterior lateral defect consistent with prior anterior MI and minimal peri-infarct ischemia in the anterolateral wall; EF 30 with global hypokinesis and anterior akinesis; severe LVE; study high risk due to reduced LV function.   Echo Study Conclusions from 05/2013  - Left ventricle: The cavity size was normal. There was moderate concentric hypertrophy. Systolic function was normal. Wall motion was normal; there were no regional wall motion abnormalities. There was a reduced contribution of atrial contraction to ventricular filling, due to increased ventricular diastolic pressure or atrial contractile dysfunction. Doppler parameters are consistent with a reversible restrictive pattern, indicative of decreased left ventricular diastolic compliance and/or increased left atrial pressure (grade 3 diastolic dysfunction). Doppler parameters are consistent with high ventricular filling pressure. - Aortic valve: A bioprosthesis was present. Moderate regurgitation. Valve area: 2.22cm^2(VTI). Valve area: 2.38cm^2 (Vmax). - Mitral valve: Calcified annulus. Mildly thickened leaflets . Mild to moderate regurgitation. - Left atrium: The atrium was moderately dilated. - Pulmonary arteries: PA peak pressure: 30mm Hg (S).    Assessment/Plan:  1. High risk amiodarone therapy He remains in NSR by exam and by EKG.   2. Atrial flutter with rapid ventricular response (HCC) no clinical recurrences. He remains in NSR  3. Coronary artery disease involving coronary bypass graft of native heart without other forms of angina pectoris ([place]) he is short of breath. No chest pain - he is not sure if he has ever had chest pain.  Resting EKG from Monday with inferolateral T wave changes. Now with high risk Myoview - discussed in detail - will proceed on with cardiac catheterization on Tuesday with Dr. Tamala Julian.  The patient understands that risks include but  are not limited to stroke (1 in 1000), death (1 in 41), kidney failure [usually temporary] (1 in 500), bleeding (1 in 200), allergic reaction [possibly serious] (1 in 200), and agrees to proceed.   I have reiterated several times about possible worsening of kidney function.  Also need to be cognizant of his coumadin - anemia and if he were to need a stent.   Have placed him out of work.   4. Long term (current) use of anticoagulants no bleeding on current anticoagulation regimen Checking INR today Would hold coumadin Fri, Sat, Sun, Monday  5. Chronic Systolic/diastolic heart failure ([place]) Little better clinically. Now with new LV dysfunction. Getting echo done today.   6. CKD (chronic kidney disease) stage 3, GFR 30-59 ml/min checking labs today  he does not see Nephrololgy  7. Bioprosthetic aortic valve replacement  Echo today.   8. Bilateral carotid artery stenosis follow clinically  9. PAD (peripheral artery disease) ([place]) not discussed today.  10. DM  11. Anemia - looks chronic - I suspect from CKD - he has NO primary care - says Dr. Tamala Julian "does everything". Explained that we will need to get him to PCP.   Current medicines are reviewed with the patient today.  The patient does not have concerns regarding medicines other than what has been noted above.  The following changes have been made:  See above.  Labs/ tests ordered today include:    Orders Placed This Encounter  Procedures  . Basic metabolic panel  . CBC  . Protime-INR  . APTT     Disposition:   FU as planned.   Patient is agreeable to this plan and will call if any problems develop in the interim.   Signed: Burtis Junes, RN, ANP-C 07/09/2015 9:33 AM  [facility] [address] [place], [place]  [address] Phone: [phone] Fax: [phone]

## 2015-07-09 NOTE — Patient Instructions (Addendum)
We will be checking the following labs today - PT, PTT, CBC, BMET  Do not return to work until further notice  Medication Instructions:    Continue with your current medicines. But   Hold Coumadin this Saturday, Sunday, Monday and Tuesday  Hold Lasix Monday and Tuesday    Testing/Procedures To Be Arranged:  Cardiac catheterization  Echo today     Other Special Instructions:  Your provider has recommended a cardiac catherization  You are scheduled for a cardiac catheterization on Tuesday, November 8that 7:30 AMwith Dr. Tamala Julian or associate.  Go to [facility] 2nd Floor Short Stay on Tuesday, November 8th at 5:30 AM.  Enter thru the [facility] entrance A No food or drink after midnight on Monday. You may take your medications with a sip of water on the day of your procedure.   Coronary Angiogram A coronary angiogram, also called coronary angiography, is an X-ray procedure used to look at the arteries in the heart. In this procedure, a dye (contrast dye) is injected through a long, hollow tube (catheter). The catheter is about the size of a piece of cooked spaghetti and is inserted through your groin, wrist, or arm. The dye is injected into each artery, and X-rays are then taken to show if there is a blockage in the arteries of your heart.  LET [facility] CARE PROVIDER KNOW ABOUT: Any allergies you have, including allergies to shellfish or contrast dye.  All medicines you are taking, including vitamins, herbs, eye drops, creams, and over-the-counter medicines.  Previous problems you or members of your family have had with the use of anesthetics.  Any blood disorders you have.  Previous surgeries you have had. History of kidney problems or failure.  Other medical conditions you have.  RISKS AND COMPLICATIONS  Generally, a coronary angiogram is a safe procedure. However, about 1 person out of 1000 can have problems that may include: Allergic reaction to the  dye. Bleeding/bruising from the access site or other locations. Kidney injury, especially in people with impaired kidney function. Stroke (rare). Heart attack (rare). Irregular rhythms (rare) Death (rare)  BEFORE THE PROCEDURE  Do not eat or drink anything after midnight the night before the procedure or as directed by your health care provider.  Ask your health care provider about changing or stopping your regular medicines. This is especially important if you are taking diabetes medicines or blood thinners.  PROCEDURE You may be given a medicine to help you relax (sedative) before the procedure. This medicine is given through an intravenous (IV) access tube that is inserted into one of your veins.  The area where the catheter will be inserted will be washed and shaved. This is usually done in the groin but may be done in the fold of your arm (near your elbow) or in the wrist.  A medicine will be given to numb the area where the catheter will be inserted (local anesthetic).  The health care provider will insert the catheter into an artery. The catheter will be guided by using a special type of X-ray (fluoroscopy) of the blood vessel being examined.  A special dye will then be injected into the catheter, and X-rays will be taken. The dye will help to show where any narrowing or blockages are located in the heart arteries.    AFTER THE PROCEDURE  If the procedure is done through the leg, you will be kept in bed lying flat for several hours. You will be instructed to not  bend or cross your legs. The insertion site will be checked frequently.  The pulse in your feet or wrist will be checked frequently.  Additional blood tests, X-rays, and an electrocardiogram may be done.      If you need a refill on your cardiac medications before your next appointment, please call your pharmacy.   Call the [facility] office at [phone] if you have any questions,  problems or concerns.

## 2015-07-09 NOTE — Telephone Encounter (Signed)
Called tot give pt lab and echo results. lmtcb

## 2015-07-13 NOTE — H&P (Signed)
Decreased EF to 35%. He has Moderate AR and MR. There is anterior defect and peri-infarct ischemia.  Needs left and right heart cath with bypass angio and contrast conservation.

## 2015-07-14 NOTE — Telephone Encounter (Signed)
Pt wife aware of lab results with verbal understanding.

## 2015-07-14 NOTE — Telephone Encounter (Signed)
F/u    Pt's wife returning [name]'s phone call.

## 2015-07-14 NOTE — Progress Notes (Signed)
Notified Pt to come in tomorrow at 5:30 for IVF prior to cath.  Cath will be at 9:00.

## 2015-07-15 ENCOUNTER — Other Ambulatory Visit (HOSPITAL_COMMUNITY): Payer: Medicare Other

## 2015-07-15 ENCOUNTER — Encounter (HOSPITAL_COMMUNITY)
Admission: RE | Disposition: A | Discharge status: Home / Self Care | Payer: Self-pay | Source: Ambulatory Visit | Attending: Interventional Cardiology

## 2015-07-15 ENCOUNTER — Inpatient Hospital Stay (HOSPITAL_COMMUNITY)
Admission: RE | Admit: 2015-07-15 | Discharge: 2015-07-18 | DRG: 286 | Disposition: A | Discharge status: Home / Self Care | Payer: Medicare Other | Source: Ambulatory Visit | Attending: Interventional Cardiology | Admitting: Interventional Cardiology

## 2015-07-15 ENCOUNTER — Encounter (HOSPITAL_COMMUNITY): Payer: Self-pay | Admitting: Interventional Cardiology

## 2015-07-15 DIAGNOSIS — Z7901 Long term (current) use of anticoagulants: Secondary | ICD-10-CM | POA: Diagnosis not present

## 2015-07-15 DIAGNOSIS — N183 Chronic kidney disease, stage 3 (moderate): Secondary | ICD-10-CM | POA: Diagnosis present

## 2015-07-15 DIAGNOSIS — Z953 Presence of xenogenic heart valve: Secondary | ICD-10-CM | POA: Diagnosis not present

## 2015-07-15 DIAGNOSIS — I2582 Chronic total occlusion of coronary artery: Secondary | ICD-10-CM | POA: Diagnosis present

## 2015-07-15 DIAGNOSIS — I080 Rheumatic disorders of both mitral and aortic valves: Secondary | ICD-10-CM | POA: Diagnosis present

## 2015-07-15 DIAGNOSIS — E876 Hypokalemia: Secondary | ICD-10-CM | POA: Diagnosis present

## 2015-07-15 DIAGNOSIS — I130 Hypertensive heart and chronic kidney disease with heart failure and stage 1 through stage 4 chronic kidney disease, or unspecified chronic kidney disease: Secondary | ICD-10-CM | POA: Diagnosis present

## 2015-07-15 DIAGNOSIS — I340 Nonrheumatic mitral (valve) insufficiency: Secondary | ICD-10-CM | POA: Diagnosis not present

## 2015-07-15 DIAGNOSIS — J810 Acute pulmonary edema: Secondary | ICD-10-CM

## 2015-07-15 DIAGNOSIS — Z79899 Other long term (current) drug therapy: Secondary | ICD-10-CM

## 2015-07-15 DIAGNOSIS — I480 Paroxysmal atrial fibrillation: Secondary | ICD-10-CM | POA: Diagnosis present

## 2015-07-15 DIAGNOSIS — Z951 Presence of aortocoronary bypass graft: Secondary | ICD-10-CM | POA: Diagnosis not present

## 2015-07-15 DIAGNOSIS — J9600 Acute respiratory failure, unspecified whether with hypoxia or hypercapnia: Secondary | ICD-10-CM | POA: Diagnosis present

## 2015-07-15 DIAGNOSIS — N189 Chronic kidney disease, unspecified: Secondary | ICD-10-CM | POA: Insufficient documentation

## 2015-07-15 DIAGNOSIS — E1122 Type 2 diabetes mellitus with diabetic chronic kidney disease: Secondary | ICD-10-CM | POA: Diagnosis present

## 2015-07-15 DIAGNOSIS — I5023 Acute on chronic systolic (congestive) heart failure: Secondary | ICD-10-CM | POA: Diagnosis present

## 2015-07-15 DIAGNOSIS — D631 Anemia in chronic kidney disease: Secondary | ICD-10-CM | POA: Diagnosis present

## 2015-07-15 DIAGNOSIS — Z87891 Personal history of nicotine dependence: Secondary | ICD-10-CM | POA: Diagnosis not present

## 2015-07-15 DIAGNOSIS — N179 Acute kidney failure, unspecified: Secondary | ICD-10-CM | POA: Diagnosis present

## 2015-07-15 DIAGNOSIS — J9621 Acute and chronic respiratory failure with hypoxia: Secondary | ICD-10-CM | POA: Diagnosis not present

## 2015-07-15 DIAGNOSIS — I272 Other secondary pulmonary hypertension: Secondary | ICD-10-CM | POA: Diagnosis present

## 2015-07-15 DIAGNOSIS — I2510 Atherosclerotic heart disease of native coronary artery without angina pectoris: Secondary | ICD-10-CM | POA: Diagnosis present

## 2015-07-15 DIAGNOSIS — E8770 Fluid overload, unspecified: Secondary | ICD-10-CM | POA: Diagnosis present

## 2015-07-15 DIAGNOSIS — I6523 Occlusion and stenosis of bilateral carotid arteries: Secondary | ICD-10-CM | POA: Diagnosis present

## 2015-07-15 DIAGNOSIS — I255 Ischemic cardiomyopathy: Secondary | ICD-10-CM | POA: Diagnosis present

## 2015-07-15 DIAGNOSIS — I739 Peripheral vascular disease, unspecified: Secondary | ICD-10-CM | POA: Diagnosis present

## 2015-07-15 DIAGNOSIS — G4733 Obstructive sleep apnea (adult) (pediatric): Secondary | ICD-10-CM | POA: Diagnosis present

## 2015-07-15 DIAGNOSIS — I4892 Unspecified atrial flutter: Secondary | ICD-10-CM | POA: Diagnosis present

## 2015-07-15 HISTORY — PX: CARDIAC CATHETERIZATION: SHX172

## 2015-07-15 LAB — POCT I-STAT 3, ART BLOOD GAS (G3+)
Acid-Base Excess: 3 mmol/L — ABNORMAL HIGH (ref 0.0–2.0)
Bicarbonate: 26.3 mEq/L — ABNORMAL HIGH (ref 20.0–24.0)
O2 SAT: 91 %
TCO2: 27 mmol/L (ref 0–100)
pCO2 arterial: 35.9 mmHg (ref 35.0–45.0)
pH, Arterial: 7.473 — ABNORMAL HIGH (ref 7.350–7.450)
pO2, Arterial: 56 mmHg — ABNORMAL LOW (ref 80.0–100.0)

## 2015-07-15 LAB — POCT I-STAT 3, VENOUS BLOOD GAS (G3P V)
ACID-BASE EXCESS: 3 mmol/L — AB (ref 0.0–2.0)
BICARBONATE: 27.2 meq/L — AB (ref 20.0–24.0)
O2 SAT: 44 %
PO2 VEN: 23 mmHg — AB (ref 30.0–45.0)
TCO2: 28 mmol/L (ref 0–100)
pCO2, Ven: 39.3 mmHg — ABNORMAL LOW (ref 45.0–50.0)
pH, Ven: 7.448 — ABNORMAL HIGH (ref 7.250–7.300)

## 2015-07-15 LAB — BASIC METABOLIC PANEL
Anion gap: 11 (ref 5–15)
BUN: 25 mg/dL — AB (ref 6–20)
CO2: 26 mmol/L (ref 22–32)
Calcium: 9.1 mg/dL (ref 8.9–10.3)
Chloride: 99 mmol/L — ABNORMAL LOW (ref 101–111)
Creatinine, Ser: 1.67 mg/dL — ABNORMAL HIGH (ref 0.61–1.24)
GFR calc Af Amer: 46 mL/min — ABNORMAL LOW (ref >60)
GFR calc non Af Amer: 39 mL/min — ABNORMAL LOW (ref >60)
Glucose, Bld: 138 mg/dL — ABNORMAL HIGH (ref 65–99)
Potassium: 3.8 mmol/L (ref 3.5–5.1)
SODIUM: 136 mmol/L (ref 135–145)

## 2015-07-15 LAB — CBC
HEMATOCRIT: 31 % — AB (ref 39.0–52.0)
HEMOGLOBIN: 9.5 g/dL — AB (ref 13.0–17.0)
MCH: 25.3 pg — ABNORMAL LOW (ref 26.0–34.0)
MCHC: 30.6 g/dL (ref 30.0–36.0)
MCV: 82.7 fL (ref 78.0–100.0)
Platelets: 337 10*3/uL (ref 150–400)
RBC: 3.75 MIL/uL — ABNORMAL LOW (ref 4.22–5.81)
RDW: 17 % — AB (ref 11.5–15.5)
WBC: 6.7 10*3/uL (ref 4.0–10.5)

## 2015-07-15 LAB — TSH: TSH: 1.094 u[IU]/mL (ref 0.350–4.500)

## 2015-07-15 LAB — BRAIN NATRIURETIC PEPTIDE: B Natriuretic Peptide: 1103.9 pg/mL — ABNORMAL HIGH (ref 0.0–100.0)

## 2015-07-15 LAB — PROTIME-INR
INR: 1.69 — ABNORMAL HIGH (ref 0.00–1.49)
Prothrombin Time: 19.9 seconds — ABNORMAL HIGH (ref 11.6–15.2)

## 2015-07-15 LAB — MRSA PCR SCREENING: MRSA by PCR: NEGATIVE

## 2015-07-15 SURGERY — RIGHT/LEFT HEART CATH AND CORONARY/GRAFT ANGIOGRAPHY
Anesthesia: LOCAL

## 2015-07-15 MED ORDER — FUROSEMIDE 10 MG/ML IJ SOLN
INTRAMUSCULAR | Status: AC
  Filled 2015-07-15: qty 4

## 2015-07-15 MED ORDER — WARFARIN SODIUM 5 MG PO TABS
5.0000 mg | ORAL_TABLET | Freq: Once | ORAL | Status: AC
  Administered 2015-07-15: 5 mg via ORAL
  Filled 2015-07-15 (×2): qty 1

## 2015-07-15 MED ORDER — ACETAMINOPHEN 325 MG PO TABS: 650.0000 mg | ORAL_TABLET | ORAL | Status: DC | PRN

## 2015-07-15 MED ORDER — CETYLPYRIDINIUM CHLORIDE 0.05 % MT LIQD
7.0000 mL | Freq: Two times a day (BID) | OROMUCOSAL | Status: DC
Start: 2015-07-15 — End: 2015-07-16
  Administered 2015-07-16: 7 mL via OROMUCOSAL

## 2015-07-15 MED ORDER — SODIUM CHLORIDE 0.9 % WEIGHT BASED INFUSION: 1.0000 mL/kg/h | INTRAVENOUS | Status: DC

## 2015-07-15 MED ORDER — NITROGLYCERIN IN D5W 200-5 MCG/ML-% IV SOLN
INTRAVENOUS | Status: AC
  Filled 2015-07-15: qty 250

## 2015-07-15 MED ORDER — SODIUM CHLORIDE 0.9 % IJ SOLN
3.0000 mL | Freq: Two times a day (BID) | INTRAMUSCULAR | Status: DC
  Administered 2015-07-15 – 2015-07-16 (×3): 3 mL via INTRAVENOUS

## 2015-07-15 MED ORDER — MORPHINE SULFATE (PF) 2 MG/ML IV SOLN
INTRAVENOUS | Status: AC
  Filled 2015-07-15: qty 1

## 2015-07-15 MED ORDER — IOHEXOL 350 MG/ML SOLN
INTRAVENOUS | Status: DC | PRN
  Administered 2015-07-15: 70 mL via INTRAVENOUS

## 2015-07-15 MED ORDER — HEPARIN (PORCINE) IN NACL 2-0.9 UNIT/ML-% IJ SOLN
INTRAMUSCULAR | Status: AC
  Filled 2015-07-15: qty 1000

## 2015-07-15 MED ORDER — NITROGLYCERIN 0.4 MG SL SUBL
SUBLINGUAL_TABLET | SUBLINGUAL | Status: AC
  Administered 2015-07-15: 16:00:00
  Filled 2015-07-15: qty 1

## 2015-07-15 MED ORDER — NITROGLYCERIN IN D5W 200-5 MCG/ML-% IV SOLN: 0.0000 ug/min | INTRAVENOUS | Status: DC

## 2015-07-15 MED ORDER — MORPHINE SULFATE (PF) 2 MG/ML IV SOLN: 2.0000 mg | Freq: Once | INTRAVENOUS | Status: DC

## 2015-07-15 MED ORDER — POTASSIUM CHLORIDE CRYS ER 20 MEQ PO TBCR
20.0000 meq | EXTENDED_RELEASE_TABLET | Freq: Two times a day (BID) | ORAL | Status: DC
  Administered 2015-07-15 – 2015-07-16 (×3): 20 meq via ORAL
  Filled 2015-07-15 (×4): qty 1

## 2015-07-15 MED ORDER — LIDOCAINE HCL (PF) 1 % IJ SOLN
INTRAMUSCULAR | Status: DC | PRN
  Administered 2015-07-15: 10:00:00

## 2015-07-15 MED ORDER — SODIUM CHLORIDE 0.9 % IJ SOLN: 3.0000 mL | INTRAMUSCULAR | Status: DC | PRN

## 2015-07-15 MED ORDER — ASPIRIN 81 MG PO CHEW
CHEWABLE_TABLET | ORAL | Status: AC
  Filled 2015-07-15: qty 1

## 2015-07-15 MED ORDER — ENOXAPARIN SODIUM 40 MG/0.4ML ~~LOC~~ SOLN
40.0000 mg | SUBCUTANEOUS | Status: DC
  Administered 2015-07-16: 40 mg via SUBCUTANEOUS
  Filled 2015-07-15 (×2): qty 0.4

## 2015-07-15 MED ORDER — SODIUM CHLORIDE 0.9 % IJ SOLN: 3.0000 mL | Freq: Two times a day (BID) | INTRAMUSCULAR | Status: DC

## 2015-07-15 MED ORDER — ASPIRIN 81 MG PO CHEW
81.0000 mg | CHEWABLE_TABLET | Freq: Every day | ORAL | Status: DC
  Administered 2015-07-16 – 2015-07-18 (×3): 81 mg via ORAL
  Filled 2015-07-15 (×3): qty 1

## 2015-07-15 MED ORDER — ROSUVASTATIN CALCIUM 10 MG PO TABS
5.0000 mg | ORAL_TABLET | Freq: Every day | ORAL | Status: DC
  Administered 2015-07-15 – 2015-07-17 (×3): 5 mg via ORAL
  Filled 2015-07-15 (×3): qty 1

## 2015-07-15 MED ORDER — ASPIRIN 81 MG PO CHEW
81.0000 mg | CHEWABLE_TABLET | ORAL | Status: AC
  Administered 2015-07-15: 81 mg via ORAL

## 2015-07-15 MED ORDER — FUROSEMIDE 10 MG/ML IJ SOLN
10.0000 mg/h | INTRAVENOUS | Status: DC
  Filled 2015-07-15 (×2): qty 25

## 2015-07-15 MED ORDER — HEPARIN (PORCINE) IN NACL 2-0.9 UNIT/ML-% IJ SOLN
INTRAMUSCULAR | Status: AC
  Filled 2015-07-15: qty 500

## 2015-07-15 MED ORDER — ONDANSETRON HCL 4 MG/2ML IJ SOLN: 4.0000 mg | Freq: Four times a day (QID) | INTRAMUSCULAR | Status: DC | PRN

## 2015-07-15 MED ORDER — SODIUM CHLORIDE 0.9 % IV SOLN: 250.0000 mL | INTRAVENOUS | Status: DC | PRN

## 2015-07-15 MED ORDER — POTASSIUM CHLORIDE CRYS ER 20 MEQ PO TBCR
40.0000 meq | EXTENDED_RELEASE_TABLET | Freq: Once | ORAL | Status: AC
  Administered 2015-07-15: 40 meq via ORAL
  Filled 2015-07-15: qty 2

## 2015-07-15 MED ORDER — SODIUM CHLORIDE 0.9 % IV SOLN
INTRAVENOUS | Status: DC
  Administered 2015-07-15: 07:00:00 via INTRAVENOUS
  Administered 2015-07-15: 125 mL/h via INTRAVENOUS

## 2015-07-15 MED ORDER — METOLAZONE 5 MG PO TABS
5.0000 mg | ORAL_TABLET | Freq: Once | ORAL | Status: AC
  Administered 2015-07-15: 5 mg via ORAL
  Filled 2015-07-15: qty 1

## 2015-07-15 MED ORDER — FUROSEMIDE 10 MG/ML IJ SOLN
80.0000 mg | Freq: Once | INTRAMUSCULAR | Status: AC
Start: 2015-07-15 — End: 2015-07-15
  Administered 2015-07-15: 80 mg via INTRAVENOUS

## 2015-07-15 MED ORDER — AMIODARONE HCL 200 MG PO TABS
200.0000 mg | ORAL_TABLET | Freq: Every day | ORAL | Status: DC
  Administered 2015-07-15 – 2015-07-17 (×3): 200 mg via ORAL
  Filled 2015-07-15 (×3): qty 1

## 2015-07-15 MED ORDER — ISOSORB DINITRATE-HYDRALAZINE 20-37.5 MG PO TABS
1.0000 | ORAL_TABLET | Freq: Two times a day (BID) | ORAL | Status: DC
  Administered 2015-07-15: 1 via ORAL
  Filled 2015-07-15: qty 1

## 2015-07-15 MED ORDER — WARFARIN - PHARMACIST DOSING INPATIENT
Freq: Every day | Status: DC
  Administered 2015-07-16: 18:00:00

## 2015-07-15 MED ORDER — DIAZEPAM 5 MG PO TABS
5.0000 mg | ORAL_TABLET | ORAL | Status: AC
  Administered 2015-07-15: 5 mg via ORAL

## 2015-07-15 MED ORDER — NITROGLYCERIN 0.3 MG SL SUBL
0.3000 mg | SUBLINGUAL_TABLET | Freq: Once | SUBLINGUAL | Status: AC
  Administered 2015-07-15: 0.3 mg via SUBLINGUAL
  Filled 2015-07-15: qty 100

## 2015-07-15 MED ORDER — LIDOCAINE HCL (PF) 1 % IJ SOLN
INTRAMUSCULAR | Status: AC
Start: 2015-07-15 — End: 2015-07-15
  Filled 2015-07-15: qty 30

## 2015-07-15 MED ORDER — FUROSEMIDE 10 MG/ML IJ SOLN
80.0000 mg | Freq: Two times a day (BID) | INTRAMUSCULAR | Status: DC
  Filled 2015-07-15: qty 8

## 2015-07-15 MED ORDER — FUROSEMIDE 10 MG/ML IJ SOLN
10.0000 mg/h | INTRAVENOUS | Status: DC
  Administered 2015-07-15 – 2015-07-16 (×2): 10 mg/h via INTRAVENOUS
  Filled 2015-07-15 (×3): qty 25

## 2015-07-15 MED ORDER — FUROSEMIDE 10 MG/ML IJ SOLN
INTRAMUSCULAR | Status: DC | PRN
  Administered 2015-07-15: 80 mg via INTRAVENOUS

## 2015-07-15 MED ORDER — DIAZEPAM 5 MG PO TABS
ORAL_TABLET | ORAL | Status: AC
  Filled 2015-07-15: qty 1

## 2015-07-15 MED ORDER — NITROGLYCERIN IN D5W 200-5 MCG/ML-% IV SOLN
0.0000 ug/min | INTRAVENOUS | Status: DC
  Administered 2015-07-15: 10 ug/min via INTRAVENOUS

## 2015-07-15 MED ORDER — FUROSEMIDE 10 MG/ML IJ SOLN
80.0000 mg | Freq: Once | INTRAMUSCULAR | Status: AC
  Administered 2015-07-15: 80 mg via INTRAVENOUS

## 2015-07-15 MED ORDER — OXYCODONE-ACETAMINOPHEN 5-325 MG PO TABS: 1.0000 | ORAL_TABLET | ORAL | Status: DC | PRN

## 2015-07-15 SURGICAL SUPPLY — 16 items
CATH INFINITI 5 FR IM (CATHETERS) ×2 IMPLANT
CATH INFINITI 5FR JL4 (CATHETERS) ×2 IMPLANT
CATH INFINITI JR4 5F (CATHETERS) ×2 IMPLANT
CATH SITESEER 5F MULTI A 2 (CATHETERS) ×2 IMPLANT
CATH SWAN GANZ 7F STRAIGHT (CATHETERS) ×2 IMPLANT
DEVICE RAD COMP TR BAND LRG (VASCULAR PRODUCTS) ×2 IMPLANT
GLIDESHEATH SLEND A-KIT 6F 22G (SHEATH) ×2 IMPLANT
KIT HEART LEFT (KITS) ×2 IMPLANT
KIT HEART RIGHT NAMIC (KITS) ×2 IMPLANT
PACK CARDIAC CATHETERIZATION (CUSTOM PROCEDURE TRAY) ×2 IMPLANT
SHEATH PINNACLE 5F 10CM (SHEATH) ×2 IMPLANT
SHEATH PINNACLE 7F 10CM (SHEATH) ×2 IMPLANT
TRANSDUCER W/STOPCOCK (MISCELLANEOUS) ×4 IMPLANT
TUBING CIL FLEX 10 FLL-RA (TUBING) ×2 IMPLANT
WIRE EMERALD 3MM-J .035X150CM (WIRE) ×2 IMPLANT
WIRE SAFE-T 1.5MM-J .035X260CM (WIRE) ×2 IMPLANT

## 2015-07-15 NOTE — Progress Notes (Signed)
Upon arrival to room patient off BiPap and on 4L [place]. Pt in no distress, clear, diminished BBS, sats 93%. Pt states his breathing feels much better than earlier. Will leave on [place] for now and continue to monitor as necessary.

## 2015-07-15 NOTE — H&P (View-Only) (Signed)
CARDIOLOGY OFFICE NOTE  Date:  07/09/2015    [name] Date of Birth: February 08, 1941 Medical Record #[id number]  PCP:  No PCP Per Patient  Cardiologist:  Tamala Julian    Chief Complaint  Patient presents with  . Coronary Artery Disease    Pre cath visit - seen for Dr. Tamala Julian  . Congestive Heart Failure  . Atrial Fibrillation    History of Present Illness: TSUNEO FAISON is a 74 y.o. male who presents today for a pre cath/follow up visit. Seen for Dr. Tamala Julian.   He has a history of diastolic heart failure, paroxysmal atrial fibrillation/flutter, coronary artery disease with prior CABG x 3 with LIMA to LAD, SVG to RC, SVG to OM, AVR with #23 mm Magna Ease pericardial tissue valve per Dr. Cyndia Bent back in 2010, PAD with claudication,essential hypertension, chronic anticoagulation therapy with coumadin, & chronic amiodarone therapy for PAF/flutter.  Seen back in October - felt to be stable but not compliant with his diuretic regimen.   Phone call on last Friday - had mistakenly taken extra dose of Norvasc. BP did ok. HR was fine.   Phone call back on Monday:  Returned pt wife call. She sts that pt has been having increased son and fatigue since Friday. Pt left work early on Fri, because he felt weak. Pt wife made several calls to our office. Pt wife sts that the pt was seen by urgent care on 10/29, nothing was done and they are not confident with urgent cares evaluation. Pt wife sts that pt is still having increased sob, pt complains of orthopnea, pt is complaining of feeling tired and weak like his heart is out of rhythm. Adv pt wife that Dr.Smith is out of the office, I will talk with another provider and call back with their recommendation. Pt wife verbalized understanding  Adv pt wife that I have discussed pt symptoms with Tera Helper, NP (flex), we can add pt to her flex scheduled today @ 1:30pm. Pt wife voiced appreciation, pt will come on today to see Cecille Rubin at 1:30pm            I then saw him this past Monday - he was "losing his breath". No energy. Weight was down. No swelling. Wife had been giving him more Lasix. I ordered a Myoview and echo. EKG was abnormal.   Myoview yesterday was high risk - brought back now to discuss cardiac cath. He has not had his echo yet. EF has fallen by Myoview and with large partially reversible anterior lateral defect. Last echo from 2014 showed normal LV function.         Comes back today. Here with his wife. He felt a little better yesterday - not really clear as to why. Slept better last night.    Past Medical History  Diagnosis Date  . Coronary artery disease   . Angina   . Hypertension   . Atrial flutter ([place])   . Obstructive sleep apnea     severe,states had test, no machine, used oxygen in past, not now    Past Surgical History  Procedure Laterality Date  . Cardiac valve replacement      aortic valve replacement (bioprosthetic)  . Tee without cardioversion  11/30/2011    Procedure: TRANSESOPHAGEAL ECHOCARDIOGRAM (TEE);  Surgeon: Sueanne Margarita, MD;  Location: [facility] ENDOSCOPY;  Service: Cardiovascular;  Laterality: N/A;  . Coronary artery bypass graft      x3 vessels  . Atrial flutter  ablation  07-05-13    05-15-13  . Colonoscopy with propofol N/A 07/17/2013    Procedure: COLONOSCOPY WITH PROPOFOL;  Surgeon: Garlan Fair, MD;  Location: [facility] ENDOSCOPY;  Service: Endoscopy;  Laterality: N/A;  . Esophagogastroduodenoscopy (egd) with propofol N/A 07/17/2013    Procedure: ESOPHAGOGASTRODUODENOSCOPY (EGD) WITH PROPOFOL;  Surgeon: Garlan Fair, MD;  Location: [facility] ENDOSCOPY;  Service: Endoscopy;  Laterality: N/A;  . Atrial flutter ablation N/A 05/15/2013    Procedure: ATRIAL FLUTTER ABLATION;  Surgeon: Coralyn Mark, MD;  Location: [facility] CATH LAB;  Service: Cardiovascular;  Laterality: N/A;     Medications: Current Outpatient Prescriptions  Medication Sig Dispense Refill  . amiodarone (PACERONE) 200 MG tablet TAKE 1  TABLET BY MOUTH AT BEDTIME 30 tablet 6  . amLODipine (NORVASC) 5 MG tablet TAKE 1 TABLET BY MOUTH EVERY DAY 30 tablet 5  . furosemide (LASIX) 80 MG tablet TAKE 1.5 TABLETS (120 MG TOTAL) BY MOUTH 2 (TWO) TIMES DAILY. 90 tablet 5  . Glucosamine HCl (GLUCOSAMINE PO) Take 1 tablet by mouth daily. Joint Support    . Multiple Vitamins-Minerals (MULTIVITAMIN PO) Take 2-3 tablets by mouth daily.    Marland Kitchen omega-3 acid ethyl esters (LOVAZA) 1 G capsule Take 1 g by mouth 3 (three) times daily.    Marland Kitchen OVER THE COUNTER MEDICATION Take 1 tablet by mouth daily as needed (when ever he thinks he needs it). Ultimate prostate    . OVER THE COUNTER MEDICATION Take 1 tablet by mouth daily as needed (when ever he thinks he needs it). Male factor/Prelox Blue    . OVER THE COUNTER MEDICATION Take 1 tablet by mouth daily as needed (when ever he thinks he needs it). Schizandra/RoseGuard    . OVER THE COUNTER MEDICATION Take 1 tablet by mouth daily as needed (when ever he thinks he needs it). Snack Defense    . OVER THE COUNTER MEDICATION Take 1 tablet by mouth daily as needed (when ever he thinks he needs it). Niteworks    . rosuvastatin (CRESTOR) 5 MG tablet Take 1 tablet (5 mg total) by mouth at bedtime. 90 tablet 1  . warfarin (COUMADIN) 5 MG tablet Take as directed by [facility] 30 tablet 2   No current facility-administered medications for this visit.    Allergies: No Known Allergies  Social History: The patient  reports that he quit smoking about 36 years ago. His smoking use included Cigarettes. He started smoking about 46 years ago. He has a 1.3 pack-year smoking history. He has never used smokeless tobacco. He reports that he drinks about 1.2 oz of alcohol per week. He reports that he does not use illicit drugs.   Family History: The patient's family history includes Heart disease in his mother.   Review of Systems: Please see the history of present illness.   Otherwise, the review of systems is positive  for none.   All other systems are reviewed and negative.   Physical Exam: VS:  BP 148/48 mmHg  Pulse 83  Ht 5\' 11"  (1.803 m)  Wt 216 lb 6.4 oz (98.158 kg)  BMI 30.19 kg/m2  SpO2 97% .  BMI Body mass index is 30.19 kg/(m^2).  Wt Readings from Last 3 Encounters:  07/09/15 216 lb 6.4 oz (98.158 kg)  07/08/15 214 lb (97.07 kg)  07/07/15 214 lb (97.07 kg)    General: Pleasant. Well developed, well nourished and in no acute distress.  HEENT: Normal. Neck: Supple, no JVD, carotid bruits, or masses noted.  Cardiac: Regular rate and rhythm. No murmurs, rubs, or gallops. No edema.  Respiratory:  Lungs are clear to auscultation bilaterally with normal work of breathing.  GI: Soft and nontender.  MS: No deformity or atrophy. Gait and ROM intact. Skin: Warm and dry. Color is normal.  Neuro:  Strength and sensation are intact and no gross focal deficits noted.  Psych: Alert, appropriate and with normal affect.   LABORATORY DATA:  EKG:  EKG is not ordered today.  Lab Results  Component Value Date   WBC 8.0 07/08/2015   HGB 10.0* 07/08/2015   HCT 32.4* 07/08/2015   PLT 377 07/08/2015   GLUCOSE 153* 07/08/2015   ALT 54* 07/08/2015   AST 54* 07/08/2015   NA 133* 07/08/2015   K 3.7 07/08/2015   CL 95* 07/08/2015   CREATININE 1.73* 07/08/2015   BUN 41* 07/08/2015   CO2 26 07/08/2015   TSH 0.902 07/08/2015   INR 3.2 07/07/2015   HGBA1C 6.8* 05/11/2013    BNP (last 3 results) No results for input(s): BNP in the last 8760 hours.  ProBNP (last 3 results) No results for input(s): PROBNP in the last 8760 hours.   Other Studies Reviewed Today: Study Highlights     Nuclear stress EF: 30%.  The left ventricular ejection fraction is moderately decreased (30-44%).  There was no ST segment deviation noted during stress.  This is a high risk study.  Findings consistent with prior myocardial infarction with peri-infarct ischemia.  High risk stress nuclear study with a large,  severe, partially reversible anterior lateral defect consistent with prior anterior MI and minimal peri-infarct ischemia in the anterolateral wall; EF 30 with global hypokinesis and anterior akinesis; severe LVE; study high risk due to reduced LV function.   Echo Study Conclusions from 05/2013  - Left ventricle: The cavity size was normal. There was moderate concentric hypertrophy. Systolic function was normal. Wall motion was normal; there were no regional wall motion abnormalities. There was a reduced contribution of atrial contraction to ventricular filling, due to increased ventricular diastolic pressure or atrial contractile dysfunction. Doppler parameters are consistent with a reversible restrictive pattern, indicative of decreased left ventricular diastolic compliance and/or increased left atrial pressure (grade 3 diastolic dysfunction). Doppler parameters are consistent with high ventricular filling pressure. - Aortic valve: A bioprosthesis was present. Moderate regurgitation. Valve area: 2.22cm^2(VTI). Valve area: 2.38cm^2 (Vmax). - Mitral valve: Calcified annulus. Mildly thickened leaflets . Mild to moderate regurgitation. - Left atrium: The atrium was moderately dilated. - Pulmonary arteries: PA peak pressure: 3mm Hg (S).    Assessment/Plan:  1. High risk amiodarone therapy He remains in NSR by exam and by EKG.   2. Atrial flutter with rapid ventricular response (HCC) no clinical recurrences. He remains in NSR  3. Coronary artery disease involving coronary bypass graft of native heart without other forms of angina pectoris ([place]) he is short of breath. No chest pain - he is not sure if he has ever had chest pain.  Resting EKG from Monday with inferolateral T wave changes. Now with high risk Myoview - discussed in detail - will proceed on with cardiac catheterization on Tuesday with Dr. Tamala Julian.  The patient understands that risks include but  are not limited to stroke (1 in 1000), death (1 in 3), kidney failure [usually temporary] (1 in 500), bleeding (1 in 200), allergic reaction [possibly serious] (1 in 200), and agrees to proceed.   I have reiterated several times about possible worsening of kidney function.  Also need to be cognizant of his coumadin - anemia and if he were to need a stent.   Have placed him out of work.   4. Long term (current) use of anticoagulants no bleeding on current anticoagulation regimen Checking INR today Would hold coumadin Fri, Sat, Sun, Monday  5. Chronic Systolic/diastolic heart failure ([place]) Little better clinically. Now with new LV dysfunction. Getting echo done today.   6. CKD (chronic kidney disease) stage 3, GFR 30-59 ml/min checking labs today  he does not see Nephrololgy  7. Bioprosthetic aortic valve replacement  Echo today.   8. Bilateral carotid artery stenosis follow clinically  9. PAD (peripheral artery disease) ([place]) not discussed today.  10. DM  11. Anemia - looks chronic - I suspect from CKD - he has NO primary care - says Dr. Tamala Julian "does everything". Explained that we will need to get him to PCP.   Current medicines are reviewed with the patient today.  The patient does not have concerns regarding medicines other than what has been noted above.  The following changes have been made:  See above.  Labs/ tests ordered today include:    Orders Placed This Encounter  Procedures  . Basic metabolic panel  . CBC  . Protime-INR  . APTT     Disposition:   FU as planned.   Patient is agreeable to this plan and will call if any problems develop in the interim.   Signed: Burtis Junes, RN, ANP-C 07/09/2015 9:33 AM  [facility] [address] [place], [place]  [address] Phone: [phone] Fax: [phone]

## 2015-07-15 NOTE — Progress Notes (Signed)
Increased respiratory effort with oxygen saturations 87%. Respirations--> 30   Respiratory Therapy contacted for stat BiPap.   Bipap placed. Oxygen saturations improved to 98%. Appears more comfortable. Respiration decreased 24.    Dr Haroldine Laws at bedside.   Amy Clegg NP-C  5:02 PM   Patient seen and examined with Darrick Grinder, NP. We discussed all aspects of the encounter. I agree with the assessment and plan as stated above.   Patient with severe respiratory distress due to pulmonary edema. Markedly elevated filling pressures on cath this afternoon.   On exam, sitting upright. Breathing almost 40/min. Sats in 80s on 6L [place]. SBP 180s. Treated with emergent Bipap, IV lasix, NTG gtt, metolazone. Respiratory status gradually improved and able to avoid intubation. Discussed with patient and his wife.   CCT personally administered 45 minutes. Dr. Aundra Dubin then arrived at bedside and reassumed care.   Lux Skilton,MD 10:10 PM

## 2015-07-15 NOTE — Progress Notes (Signed)
Darrick Grinder NP paged with concern of patient having increased work of breathing , no PICC access due to IV team not able to insert until am and no urine output since IV lasix was given. Amy states she will come to unit to assess patient. Amy Clegg NP on units and orders are being written.

## 2015-07-15 NOTE — Progress Notes (Signed)
ANTICOAGULATION CONSULT NOTE - Initial Consult  Pharmacy Consult for coumadin Indication: atrial fibrillation  No Known Allergies  Patient Measurements: Height: 5\' 11"  (180.3 cm) Weight: 210 lb (95.255 kg) IBW/kg (Calculated) : 75.3   Vital Signs: Temp: 98.1 F (36.7 C) (11/08 0550) Temp Source: Oral (11/08 0550) BP: 152/66 mmHg (11/08 1335) Pulse Rate: 86 (11/08 1335)  Labs:  Recent Labs  07/15/15 0735  HGB 9.5*  HCT 31.0*  PLT 337  LABPROT 19.9*  INR 1.69*  CREATININE 1.67*    Estimated Creatinine Clearance: 47.1 mL/min (by C-G formula based on Cr of 1.67).   Medical History: Past Medical History  Diagnosis Date  . Coronary artery disease   . Angina   . Hypertension   . Atrial flutter ([place])   . Obstructive sleep apnea     severe,states had test, no machine, used oxygen in past, not now   Assessment: 74 y.o. male followed with hx of chronic combined CHF ECHO 07/09/15 LVEF 35%.  Patient was on coumadin pta with slight supratherapeutic level last month, patient instructed to hold dose 4 days prior to cath this am. INR on admission this am was 1.69.   No bleeding or hematoma complications noted during cath. Hgb 9.5 which is fairly consistent for him. Orders received to resume warfarin tonight and start sq lovenox for px tomorrow, will d/c if/when INR is >2.  Pta dose 2.5mg  daily except 5mg  mwf  Goal of Therapy:  INR 2-3 Monitor platelets by anticoagulation protocol: Yes   Plan:  Warfarin 5mg  tonight Daily INR Stop lovenox when INR>2  Erin Hearing PharmD., BCPS Clinical Pharmacist Pager [phone] 07/15/2015 2:09 PM

## 2015-07-15 NOTE — Interval H&P Note (Signed)
Cath Lab Visit (complete for each Cath Lab visit)  Clinical Evaluation Leading to the Procedure:   ACS: No.  Non-ACS:    Anginal Classification: CCS III  Anti-ischemic medical therapy: Maximal Therapy (2 or more classes of medications)  Non-Invasive Test Results: High-risk stress test findings: cardiac mortality >3%/year  Prior CABG: Previous CABG      History and Physical Interval Note:  07/15/2015 8:48 AM  [name] [name]  has presented today for surgery, with the diagnosis of high risk nuc study  The various methods of treatment have been discussed with the patient and family. After consideration of risks, benefits and other options for treatment, the patient has consented to  Procedure(s): Right/Left Heart Cath and Coronary/Graft Angiography (N/A) as a surgical intervention .  The patient's history has been reviewed, patient examined, no change in status, stable for surgery.  I have reviewed the patient's chart and labs.  Questions were answered to the patient's satisfaction.     Sinclair Grooms

## 2015-07-15 NOTE — Progress Notes (Signed)
Site area: RFA/RFV Site Prior to Removal:  Level 0 Pressure Applied For:20 min Manual:yes    Patient Status During Pull:  stable Post Pull Site:  Level o Post Pull Instructions Given:  yes Post Pull Pulses Present: doppler Dressing Applied:  clear Bedrest begins @ 1015 till 1415 Comments:Ruthie Barber/canderson

## 2015-07-15 NOTE — Care Management Note (Signed)
Case Management Note  Patient Details  Name: [name] [name] MRN: [id number] Date of Birth: 03/07/1941  Subjective/Objective:   Adm w chf                 Action/Plan: lives w wife   Expected Discharge Date:                  Expected Discharge Plan:     In-House Referral:     Discharge planning Services     Post Acute Care Choice:    Choice offered to:     DME Arranged:    DME Agency:     HH Arranged:    [place] Agency:     Status of Service:     Medicare Important Message Given:    Date Medicare IM Given:    Medicare IM give by:    Date Additional Medicare IM Given:    Additional Medicare Important Message give by:     If discussed at [place] of Stay Meetings, dates discussed:    Additional Comments:ur review done  Lacretia Leigh, RN 07/15/2015, 3:09 PM

## 2015-07-15 NOTE — Progress Notes (Signed)
Called by nursing staff regarding. Increased dyspnea. Poor urine output after 80 mg IV lasix.    SBP 162/70 Increased respiratory effort noted. JVP up to the jaw. Sitting straight up in the bed. O2 sats 88%. Place NRB with possible Bipap.   Instructed to give 80 mg IV lasix now. Start lasix drip 10 mg per hour.  Place foley.  Give sublingual nitro now Started nitro drip 10 mcg per hour.  Move to ICU now.  Place central line.    Discussed with Dr Aundra Dubin. Agrees with plan.   Wise Fees NP-C  4:53 PM

## 2015-07-15 NOTE — Progress Notes (Addendum)
Advanced Heart Failure Rounding Note  PCP: None Primary Cardiologist: Dr Daneen Schick Primary HF: New this admission (Dr. Aundra Dubin)  Subjective:    [name] [name] is a 74 y.o. male followed with hx of chronic combined CHF ECHO 07/09/15 LVEF 35%, RV mildly reduced, PA Peak pressure 51 mm Hg, CAD s/p CABG x 3 on 2010, Hx of afib rhythm controlled on amio, Chronic anticoagulation with coumadin, CKD stage 3, s/p bioprosthetic AVR, Bilateral carotid artery stenosis, PAD with claudication, HTN, and DM who is followed as an outpatient  by Dr Tamala Julian.   Last seen for pre cath visit on 07/09/15. Had noted increased SOB, orthopnea, and fatigue since 07/04/15. Seen in urgent care 10/29 with no changes made. Had high risk Myoview on 07/08/15 with large, severe, partially reversible anterior lateral defect and EF 30% with global hypokinesis and scheduled for [facility] with Dr Tamala Julian on 07/15/15.  Coumadin held x 4 days leading up to stent. Pertinent admission labs include K 3.8, Cr 1.67, BNP 657.2, and INR 1.69. CXR pending.  LHC 07/15/15 with severe native CAD but patent grafts x 3 and RHC showed Fick CO 4.71, CI 2.19, LVEDP 30 mmHg, PW Mean 36 mmHg (full report as below) consistent with volume overload, severe pulmonary hypertension, and mitral valve regurgitation.   CHF team consulted for decompensated CHF with newly diagnosed systolic component and PH.  He states that he had been his usual state of health until the end of October.  [place] a Dance movement psych[place] and could walk up to a mile without difficulty.  His wife states she stopped setting out his meds for him about 2 months ago and he seems to have gradually declined since then. He states he has not been taking his lasix regularly for about the same time period. Has taken his other medications as directed. For the past 1-2 weeks he has had NYHA Class IIIb symptoms, and has had SOB with ADLs that only improves with rest.  He has profound orthopnea that would only  resolve after 4-5 minutes of sitting up. He drinks about 32 oz of fluid a day and watches his salt, but does not have a specific goal. Weights at home 210 and has not seemed to change over past few months. Former smoker. Drinks 1-2 glasses of wine daily. He denies CP, dizziness, lightheadedness, or near syncope.  He is not on home oxygen.   Objective:   Weight Range: 210 lb (95.255 kg) Body mass index is 29.3 kg/(m^2).   Vital Signs:   Temp:  [98.1 F (36.7 C)] 98.1 F (36.7 C) (11/08 0550) Pulse Rate:  [0-263] 74 (11/08 1235) Resp:  [0-35] 30 (11/08 1235) BP: (119-156)/(47-68) 130/51 mmHg (11/08 1235) SpO2:  [0 %-97 %] 93 % (11/08 1235) Weight:  [210 lb (95.255 kg)] 210 lb (95.255 kg) (11/08 0550)    Weight change: Filed Weights   07/15/15 0550  Weight: 210 lb (95.255 kg)    Intake/Output:  No intake or output data in the 24 hours ending 07/15/15 1320   Physical Exam: General:  Elderly appearing. Increased work of breathing HEENT: normal Neck: supple. JVP to jaw. Carotids 2+ bilat; No thyromegaly appreciated. Cor: PMI nondisplaced. Regular rate & rhythm. II/VI MR murmur Lungs: Diminished with bibasilar rales, increased effort Abdomen: Obese, soft, nontender, nondistended. No HSM. No bruits or masses. +BS Extremities: no cyanosis, clubbing, rash. 2-3+ edema into thighs. Extremities cool Neuro: alert & orientedx3, cranial nerves grossly intact. moves all 4 extremities  w/o difficulty. Affect pleasant  Telemetry:  NSR in 80s  Labs: CBC  Recent Labs  07/15/15 0735  WBC 6.7  HGB 9.5*  HCT 31.0*  MCV 82.7  PLT 564   Basic Metabolic Panel  Recent Labs  07/15/15 0735  NA 136  K 3.8  CL 99*  CO2 26  GLUCOSE 138*  BUN 25*  CREATININE 1.67*  CALCIUM 9.1   Liver Function Tests No results for input(s): AST, ALT, ALKPHOS, BILITOT, PROT, ALBUMIN in the last 72 hours. No results for input(s): LIPASE, AMYLASE in the last 72 hours. Cardiac Enzymes No results for  input(s): CKTOTAL, CKMB, CKMBINDEX, TROPONINI in the last 72 hours.  BNP: BNP (last 3 results) No results for input(s): BNP in the last 8760 hours.  ProBNP (last 3 results) No results for input(s): PROBNP in the last 8760 hours.   D-Dimer No results for input(s): DDIMER in the last 72 hours. Hemoglobin A1C No results for input(s): HGBA1C in the last 72 hours. Fasting Lipid Panel No results for input(s): CHOL, HDL, LDLCALC, TRIG, CHOLHDL, LDLDIRECT in the last 72 hours. Thyroid Function Tests No results for input(s): TSH, T4TOTAL, T3FREE, THYROIDAB in the last 72 hours.  Invalid input(s): FREET3  Other results:     Imaging/Studies:  No results found.  Latest Cath LHC 07/15/15 Severe native CAD with mostly patent grafts. See op note for full report.  RHC 07/15/15 Fick Cardiac Output 4.71 L/min  Fick Cardiac Output Index 2.19 (L/min)/BSA  RA A Wave 23 mmHg  RA V Wave 21 mmHg  RA Mean 18 mmHg  RV Systolic Pressure 61 mmHg  RV Diastolic Pressure 14 mmHg  RV EDP 21 mmHg  PA Systolic Pressure 62 mmHg  PA Diastolic Pressure 28 mmHg  PA Mean 43 mmHg  PW A Wave 37 mmHg  PW V Wave 52 mmHg  PW Mean 36 mmHg  AO Systolic Pressure 332 mmHg  AO Diastolic Pressure 50 mmHg  AO Mean 76 mmHg  LV Systolic Pressure 951 mmHg  LV Diastolic Pressure 20 mmHg  LV EDP 30 mmHg  Arterial Occlusion Pressure Extended Systolic Pressure 884 mmHg  Arterial Occlusion Pressure Extended Diastolic Pressure 48 mmHg  Arterial Occlusion Pressure Extended Mean Pressure 74 mmHg  Left Ventricular Apex Extended Systolic Pressure 166 mmHg  Left Ventricular Apex Extended Diastolic Pressure 12 mmHg  Left Ventricular Apex Extended EDP Pressure 31 mmHg  QP/QS 1  TPVR Index 19.64 HRUI  TSVR Index 34.7 HRUI  PVR SVR Ratio 0.17  TPVR/TSVR Ratio 0.57     Medications:     Scheduled Medications: . aspirin      . [START ON 07/16/2015] aspirin  81 mg Oral Daily  . diazepam      . [START ON 07/16/2015]  enoxaparin (LOVENOX) injection  40 mg Subcutaneous Q24H  . sodium chloride  3 mL Intravenous Q12H  . sodium chloride  3 mL Intravenous Q12H    Infusions: . sodium chloride 125 mL/hr at 07/15/15 0724  . sodium chloride 1 mL/kg/hr (07/15/15 1005)    PRN Medications: sodium chloride, sodium chloride, acetaminophen, ondansetron (ZOFRAN) IV, oxyCODONE-acetaminophen, sodium chloride, sodium chloride   Assessment/Plan   1. Acute on chronic combined CHF ECHO 07/09/15 LVEF 35%, High Lv pressures, diffuse hypokinesis. Mild/Mod AR, Mod MR, RV mildly reduced. PA Peak pressure 51 mm Hg. Cath 07/15/15 EF 30%. ICM. - He is markedly volume overloaded.  Agree with IV diuresis starting at 80 mg IV BID,  with recent AKI will see how  he responds before increasing.  Will give a dose now.  - No BB yet marked decompensation - Will have PICC placed for co-ox and CVP monitoring. 2. Acute resp distress - Likely related to marked volume overload.  02 sats in 80s lying flat, improved to 90s with 3 lpm via [place] and sitting up, but drop to 88% with speaking.   - Continue to watch closely.  3. CAD: LHC 07/15/15 with severe native coronary disease but widely patent bypass graft x 3. - Cont ASA and statin 4. Pulmonary hypertension: Pulmonary venous hypertension on cath today.  5. Mitral Regurgitation: At least moderate MR.  V wave up to 50 mmHg by RHC 07/15/15. Mean wedge 36.  MR versus severe HF.  - Will order TEE once more stable for further evaluation of valve 6. Aortic Regurg - s/p AVR bioprosthesis 7. AKI on CKD III - Monitor closely with diuresis.  Improved from last week. 8. Hx of Afib - NSR currently - Continue amiodarone and resume warfarin. Dosing per pharm.  Length of Stay: 0  Satira Mccallum Tillery PA-C 07/15/2015, 1:20 PM  Advanced Heart Failure Team Pager [phone] (M-F; 7a - 4p)  Please contact [facility] for night-coverage after hours (4p -7a ) and weekends on [email or web address]  Patient seen with  PA, agree with the above note.   1. Acute on chronic systolic CHF: EF 93%, newly decreased.  Markedly volume overloaded. BP elevated.  - NTG gtt, titrate to SBP around 130.  - Lasix 80 mg IV bolus then 10 mg/hr.  - PICC placement, CVP and co-ox to be followed.  2. CAD: Stable, patent grafts.   3. Pulmonary venous hypertension.  4. Aortic valve bioprosthesis: Mild to moderate AI on last echo.  5. Mitral regurgitation: At least moderate on last echo.  Large v waves on RHC due to volume overload versus MR.  Will diurese, consider TEE when diuresed.  6. CKD: Follow creatinine closely with diuresis and contrast.  7. Atrial fibrillation: Paroxysmal.  Restart warfarin and continue amiodarone.   Loralie Champagne 07/15/2015 5:38 PM

## 2015-07-16 LAB — CBC
HCT: 30.4 % — ABNORMAL LOW (ref 39.0–52.0)
HEMOGLOBIN: 9.3 g/dL — AB (ref 13.0–17.0)
MCH: 25.2 pg — ABNORMAL LOW (ref 26.0–34.0)
MCHC: 30.6 g/dL (ref 30.0–36.0)
MCV: 82.4 fL (ref 78.0–100.0)
Platelets: 370 10*3/uL (ref 150–400)
RBC: 3.69 MIL/uL — AB (ref 4.22–5.81)
RDW: 17 % — ABNORMAL HIGH (ref 11.5–15.5)
WBC: 9.4 10*3/uL (ref 4.0–10.5)

## 2015-07-16 LAB — BASIC METABOLIC PANEL
ANION GAP: 8 (ref 5–15)
BUN: 25 mg/dL — ABNORMAL HIGH (ref 6–20)
CO2: 28 mmol/L (ref 22–32)
Calcium: 8.8 mg/dL — ABNORMAL LOW (ref 8.9–10.3)
Chloride: 99 mmol/L — ABNORMAL LOW (ref 101–111)
Creatinine, Ser: 1.74 mg/dL — ABNORMAL HIGH (ref 0.61–1.24)
GFR calc non Af Amer: 37 mL/min — ABNORMAL LOW (ref >60)
GFR, EST AFRICAN AMERICAN: 43 mL/min — AB (ref >60)
Glucose, Bld: 150 mg/dL — ABNORMAL HIGH (ref 65–99)
Potassium: 3.5 mmol/L (ref 3.5–5.1)
SODIUM: 135 mmol/L (ref 135–145)

## 2015-07-16 LAB — GLUCOSE, CAPILLARY: Glucose-Capillary: 141 mg/dL — ABNORMAL HIGH (ref 65–99)

## 2015-07-16 LAB — PROTIME-INR
INR: 1.58 — AB (ref 0.00–1.49)
PROTHROMBIN TIME: 18.9 s — AB (ref 11.6–15.2)

## 2015-07-16 MED ORDER — WARFARIN SODIUM 5 MG PO TABS
5.0000 mg | ORAL_TABLET | Freq: Once | ORAL | Status: AC
  Administered 2015-07-16: 5 mg via ORAL
  Filled 2015-07-16: qty 1

## 2015-07-16 MED ORDER — SODIUM CHLORIDE 0.9 % IJ SOLN
10.0000 mL | Freq: Two times a day (BID) | INTRAMUSCULAR | Status: DC
  Administered 2015-07-16 – 2015-07-17 (×3): 10 mL

## 2015-07-16 MED ORDER — SODIUM CHLORIDE 0.9 % IJ SOLN: 10.0000 mL | INTRAMUSCULAR | Status: DC | PRN

## 2015-07-16 MED ORDER — POTASSIUM CHLORIDE CRYS ER 20 MEQ PO TBCR
40.0000 meq | EXTENDED_RELEASE_TABLET | Freq: Once | ORAL | Status: AC
  Administered 2015-07-16: 40 meq via ORAL
  Filled 2015-07-16: qty 2

## 2015-07-16 MED ORDER — ENOXAPARIN SODIUM 40 MG/0.4ML ~~LOC~~ SOLN
40.0000 mg | SUBCUTANEOUS | Status: DC
Start: 2015-07-16 — End: 2015-07-16

## 2015-07-16 MED ORDER — SPIRONOLACTONE 25 MG PO TABS
12.5000 mg | ORAL_TABLET | Freq: Every day | ORAL | Status: DC
  Administered 2015-07-16: 12.5 mg via ORAL
  Filled 2015-07-16: qty 1

## 2015-07-16 MED ORDER — ENOXAPARIN SODIUM 40 MG/0.4ML ~~LOC~~ SOLN
40.0000 mg | SUBCUTANEOUS | Status: DC
  Administered 2015-07-16 – 2015-07-17 (×2): 40 mg via SUBCUTANEOUS
  Filled 2015-07-16 (×2): qty 0.4

## 2015-07-16 MED ORDER — ISOSORB DINITRATE-HYDRALAZINE 20-37.5 MG PO TABS
1.0000 | ORAL_TABLET | Freq: Three times a day (TID) | ORAL | Status: DC
  Administered 2015-07-16 – 2015-07-18 (×7): 1 via ORAL
  Filled 2015-07-16 (×7): qty 1

## 2015-07-16 NOTE — Progress Notes (Addendum)
CARDIAC REHAB PHASE I   PRE:  Rate/Rhythm: 75 SR  BP:  Sitting: 125/47        SaO2: 98 4 L  MODE:  Ambulation: 700 ft   POST:  Rate/Rhythm: 96 SR  BP:  Sitting: 122/97         SaO2: 96 4L  Pt ambulated 700 ft on 4L O2, IV pole, foley catheter, steady gait, assist x1, tolerated well. Pt c/o very mild DOE, denies CP, dizziness, declined rest stop. Completed CHF education with pt and wife at bedside. Reviewed daily weights, sodium and fluid restrictions, heart healthy and low sodium diet handouts, CHF booklet and zone tool and phase 2 cardiac rehab (gave pt brochure). Pt verbalized understanding. Pt and wife with very flat affect. Pt states he is unsure at this time whether or not he would like to pursue phase 2 cardiac rehab, will follow-up. Will review exercise guidelines tomorrow, pt states he is very active and was walking up to two miles a day prior to hospitalization. Pt to recliner after walk, feet elevated, call bell within reach. Will follow.    [id number]   Lenna Sciara, RN, BSN 07/16/2015 2:41 PM

## 2015-07-16 NOTE — Progress Notes (Signed)
Peripherally Inserted Central Catheter/Midline Placement  The IV Nurse has discussed with the patient and/or persons authorized to consent for the patient, the purpose of this procedure and the potential benefits and risks involved with this procedure.  The benefits include less needle sticks, lab draws from the catheter and patient may be discharged home with the catheter.  Risks include, but not limited to, infection, bleeding, blood clot (thrombus formation), and puncture of an artery; nerve damage and irregular heat beat.  Alternatives to this procedure were also discussed.  PICC/Midline Placement Documentation  PICC / Midline Double Lumen 07/16/15 PICC Right Brachial 48 cm 2 cm (Active)  Indication for Insertion or Continuance of Line Prolonged intravenous therapies 07/16/2015  4:00 PM  Exposed Catheter (cm) 2 cm 07/16/2015  4:00 PM  Dressing Change Due 07/23/15 07/16/2015  4:00 PM       Jule Economy Horton 07/16/2015, 4:57 PM

## 2015-07-16 NOTE — Progress Notes (Signed)
Pt on nasal cannula at this time and is not in any distress, all vital signs are within normal ranges, RT will continue to monitor.

## 2015-07-16 NOTE — Progress Notes (Addendum)
Advanced Heart Failure Rounding Note  PCP: None Primary Cardiologist: Dr Daneen Schick Primary HF: New this admission (Dr. Aundra Dubin)  Subjective:    Yesterday he developed acute respiratory failure with marked volume overload. Diuresed with IV lasix and started on Nitro drip. Brisk diuresis noted -5.4 liters. Required short term Biipap but later weaned to nasal cannula.   Feeling much better. Denies SOB/Orthopnea.     Objective:   Weight Range: 210 lb (95.255 kg) Body mass index is 29.3 kg/(m^2).   Vital Signs:   Temp:  [98 F (36.7 C)-98.9 F (37.2 C)] 98.8 F (37.1 C) (11/09 0400) Pulse Rate:  [0-263] 75 (11/09 0600) Resp:  [0-35] 24 (11/09 0600) BP: (112-170)/(45-111) 123/66 mmHg (11/09 0600) SpO2:  [0 %-100 %] 93 % (11/09 0600) FiO2 (%):  [3 %-40 %] 40 % (11/08 1707)    Weight change: Filed Weights   07/15/15 0550  Weight: 210 lb (95.255 kg)    Intake/Output:   Intake/Output Summary (Last 24 hours) at 07/16/15 0706 Last data filed at 07/16/15 0600  Gross per 24 hour  Intake 248.13 ml  Output   5700 ml  Net -5451.87 ml     Physical Exam: General:  Elderly appearing. In the chair.  HEENT: normal Neck: supple. JVP ~10. Carotids 2+ bilat; No thyromegaly appreciated. Cor: PMI nondisplaced. Regular rate & rhythm. II/VI MR murmur Lungs: Clear Abdomen: Obese, soft, nontender, nondistended. No HSM. No bruits or masses. +BS Extremities: no cyanosis, clubbing, rash. Trace to 1+ + edema into thighs. Extremities cool Neuro: alert & orientedx3, cranial nerves grossly intact. moves all 4 extremities w/o difficulty. Affect pleasant  Telemetry:  NSR in 80s  Labs: CBC  Recent Labs  07/15/15 0735 07/16/15 0100  WBC 6.7 9.4  HGB 9.5* 9.3*  HCT 31.0* 30.4*  MCV 82.7 82.4  PLT 337 025   Basic Metabolic Panel  Recent Labs  07/15/15 0735 07/16/15 0100  NA 136 135  K 3.8 3.5  CL 99* 99*  CO2 26 28  GLUCOSE 138* 150*  BUN 25* 25*  CREATININE 1.67* 1.74*   CALCIUM 9.1 8.8*   Liver Function Tests No results for input(s): AST, ALT, ALKPHOS, BILITOT, PROT, ALBUMIN in the last 72 hours. No results for input(s): LIPASE, AMYLASE in the last 72 hours. Cardiac Enzymes No results for input(s): CKTOTAL, CKMB, CKMBINDEX, TROPONINI in the last 72 hours.  BNP: BNP (last 3 results)  Recent Labs  07/15/15 2002  BNP 1103.9*    ProBNP (last 3 results) No results for input(s): PROBNP in the last 8760 hours.   D-Dimer No results for input(s): DDIMER in the last 72 hours. Hemoglobin A1C No results for input(s): HGBA1C in the last 72 hours. Fasting Lipid Panel No results for input(s): CHOL, HDL, LDLCALC, TRIG, CHOLHDL, LDLDIRECT in the last 72 hours. Thyroid Function Tests  Recent Labs  07/15/15 2002  TSH 1.094    Other results:     Imaging/Studies:  No results found.  Latest Cath LHC 07/15/15 Severe native CAD with patent grafts. See op note for full report.  RHC 07/15/15 Fick Cardiac Output 4.71 L/min  Fick Cardiac Output Index 2.19 (L/min)/BSA  RA A Wave 23 mmHg  RA V Wave 21 mmHg  RA Mean 18 mmHg  RV Systolic Pressure 61 mmHg  RV Diastolic Pressure 14 mmHg  RV EDP 21 mmHg  PA Systolic Pressure 62 mmHg  PA Diastolic Pressure 28 mmHg  PA Mean 43 mmHg  PW A Wave 37 mmHg  PW V Wave 52 mmHg  PW Mean 36 mmHg  AO Systolic Pressure 875 mmHg  AO Diastolic Pressure 50 mmHg  AO Mean 76 mmHg  LV Systolic Pressure 643 mmHg  LV Diastolic Pressure 20 mmHg  LV EDP 30 mmHg  Arterial Occlusion Pressure Extended Systolic Pressure 329 mmHg  Arterial Occlusion Pressure Extended Diastolic Pressure 48 mmHg  Arterial Occlusion Pressure Extended Mean Pressure 74 mmHg  Left Ventricular Apex Extended Systolic Pressure 518 mmHg  Left Ventricular Apex Extended Diastolic Pressure 12 mmHg  Left Ventricular Apex Extended EDP Pressure 31 mmHg  QP/QS 1  TPVR Index 19.64 HRUI  TSVR Index 34.7 HRUI  PVR SVR Ratio 0.17  TPVR/TSVR Ratio 0.57      Medications:     Scheduled Medications: . amiodarone  200 mg Oral QHS  . antiseptic oral rinse  7 mL Mouth Rinse BID  . aspirin  81 mg Oral Daily  . enoxaparin (LOVENOX) injection  40 mg Subcutaneous Q24H  . isosorbide-hydrALAZINE  1 tablet Oral BID  .  morphine injection  2 mg Intravenous Once  . potassium chloride  20 mEq Oral BID  . rosuvastatin  5 mg Oral QHS  . sodium chloride  3 mL Intravenous Q12H  . Warfarin - Pharmacist Dosing Inpatient   Does not apply q1800    Infusions: . furosemide (LASIX) infusion 10 mg/hr (07/15/15 2000)  . furosemide (LASIX) infusion    . nitroGLYCERIN 30 mcg/min (07/15/15 2000)    PRN Medications: sodium chloride, acetaminophen, ondansetron (ZOFRAN) IV, oxyCODONE-acetaminophen, sodium chloride   Assessment/Plan  74 yo with history of CAD s/p CABG, paroxysmal atrial fibrillation, CKD stage III, and bioprosthetic aortic valve was admitted after cardiac cath with marked volume overload.  He was recently noted to have fall in EF to 35%.  1. Acute on chronic systolic CHF:  Ischemic cardiomyopathy.  ECHO 07/09/15 LVEF 35%, High LV filling pressure, diffuse hypokinesis, Mild/Mod bioprosthetic AR, at least moderte MR, RV mildly reduced, PA Peak pressure 51 mm Hg.  RHC yesterday with markedly elevated right and left heart filling pressures, admitted for diuresis.  He developed flash pulmonary edema post-cath.  He diuresed very well yesterday.  - Still volume overloaded on exam.  Continue Lasix gtt at 10 mg/hr today.  - Wean NTG gtt down. Start Bidil 1 tab tid.  - Add 12.5 mg spiro daily  2. Acute Respiratory Failure: Required Bipap 11/8 weaned off to nasal cannula.  3. HTN: Placed on nitrogycerin drip. Wean off. Starting Bidil.  4. CAD: Stable, patent grafts on cardiac cath this admission.  Worsening CAD does not explain fall in EF.  Continue Crestor, not on long-term ASA given warfarin use.    5. Pulmonary venous hypertension: Noted on RHC.   Treatment will be diuresis.  6. Aortic valve bioprosthesis: Mild to moderate AI on last echo.  7.  Mitral regurgitation: At least moderate on last echo.  Large v waves on RHC due to volume overload versus MR.  Will diurese, consider TEE when diuresed.  8. CKD: Follow creatinine closely with diuresis and contrast. Creatinine today 1.7  9. Atrial fibrillation: Paroxysmal.  INR today 1.58. Pharmacy to dose.warfarin and continue amiodarone for maintenance of NSR.   Consult cardiac rehab.    Length of Stay: 1  Amy Clegg NP-C  07/16/2015, 7:06 AM  Advanced Heart Failure Team Pager [phone] (M-F; 7a - 4p)  Please contact [facility] for night-coverage after hours (4p -7a ) and weekends on [email or web address]  Patient seen  with NP, agree with the above note.  Mr Schara is doing much better this morning.  He developed pulmonary edema after cath yesterday, requiring Bipap, NGT gtt, and Lasix gtt.  He diuresed well, now on nasal cannula.  Creatinine stable.  BP controlled.  - Titrate off NTG gtt onto Bidil today.  - Add spironolactone.  - Still with volume overload, continue Lasix gtt today.  Replace K.   - Will consider TEE to evaluate degree of MR once he is diuresed.  It is possible that the large V waves were just due to volume overload/severe diastolic dysfunction and not necessarily severe MR.  However, on TTE, MR was at least moderate.    35 minutes critical care time.   Loralie Champagne 07/16/2015

## 2015-07-16 NOTE — Progress Notes (Signed)
   Much improved.  Continues to diurese with lasix drip.   BP stable off nitroglycerin drip. PICC to be placed today for CVP and CO-OX.   Transfer to stepdown.   Amy Clegg 3:03 PM

## 2015-07-16 NOTE — Progress Notes (Signed)
ANTICOAGULATION CONSULT NOTE - Follow Up Consult  Pharmacy Consult for coumadin Indication: atrial fibrillation  No Known Allergies  Patient Measurements: Height: 5\' 11"  (180.3 cm) Weight: 203 lb 11.3 oz (92.4 kg) IBW/kg (Calculated) : 75.3   Vital Signs: Temp: 98.4 F (36.9 C) (11/09 1124) Temp Source: Oral (11/09 1124) BP: 113/41 mmHg (11/09 1200) Pulse Rate: 76 (11/09 1200)  Labs:  Recent Labs  07/15/15 0735 07/16/15 0100  HGB 9.5* 9.3*  HCT 31.0* 30.4*  PLT 337 370  LABPROT 19.9* 18.9*  INR 1.69* 1.58*  CREATININE 1.67* 1.74*    Estimated Creatinine Clearance: 44.6 mL/min (by C-G formula based on Cr of 1.74).   Medical History: Past Medical History  Diagnosis Date  . Coronary artery disease   . Angina   . Hypertension   . Atrial flutter ([place])   . Obstructive sleep apnea     severe,states had test, no machine, used oxygen in past, not now   Assessment: 74 y.o. male followed with hx of chronic combined CHF ECHO 07/09/15 LVEF 35%.  Patient was on coumadin pta with slight supratherapeutic level last month, patient instructed to hold dose 4 days prior to cath on 11/8. INR on admission was 1.69.   INR 1.58 today. Hgb 9.3 stable, plt wnl. Orders received to resume warfarinand start sq lovenox for px, will d/c if/when INR is >2.  Pta dose 2.5mg  daily except 5mg  mwf  Goal of Therapy:  INR 2-3 Monitor platelets by anticoagulation protocol: Yes   Plan:  Warfarin 5mg  tonight Daily INR Monitor s/sx bleeding Stop lovenox when INR>2    Heloise Ochoa, Pharm.D. PGY2 Cardiology Pharmacy Resident Pager: [phone]  07/16/2015 1:05 PM

## 2015-07-17 ENCOUNTER — Inpatient Hospital Stay (HOSPITAL_COMMUNITY): Payer: Medicare Other

## 2015-07-17 ENCOUNTER — Encounter (HOSPITAL_COMMUNITY)
Admission: RE | Disposition: A | Discharge status: Home / Self Care | Payer: Self-pay | Source: Ambulatory Visit | Attending: Interventional Cardiology

## 2015-07-17 ENCOUNTER — Encounter (HOSPITAL_COMMUNITY): Payer: Self-pay

## 2015-07-17 DIAGNOSIS — I340 Nonrheumatic mitral (valve) insufficiency: Secondary | ICD-10-CM

## 2015-07-17 HISTORY — PX: TEE WITHOUT CARDIOVERSION: SHX5443

## 2015-07-17 LAB — BASIC METABOLIC PANEL
Anion gap: 9 (ref 5–15)
BUN: 28 mg/dL — AB (ref 6–20)
CHLORIDE: 91 mmol/L — AB (ref 101–111)
CO2: 31 mmol/L (ref 22–32)
Calcium: 8.6 mg/dL — ABNORMAL LOW (ref 8.9–10.3)
Creatinine, Ser: 1.85 mg/dL — ABNORMAL HIGH (ref 0.61–1.24)
GFR calc Af Amer: 40 mL/min — ABNORMAL LOW (ref >60)
GFR calc non Af Amer: 35 mL/min — ABNORMAL LOW (ref >60)
Glucose, Bld: 154 mg/dL — ABNORMAL HIGH (ref 65–99)
POTASSIUM: 2.9 mmol/L — AB (ref 3.5–5.1)
SODIUM: 131 mmol/L — AB (ref 135–145)

## 2015-07-17 LAB — CBC
HCT: 28.3 % — ABNORMAL LOW (ref 39.0–52.0)
Hemoglobin: 9 g/dL — ABNORMAL LOW (ref 13.0–17.0)
MCH: 26.1 pg (ref 26.0–34.0)
MCHC: 31.8 g/dL (ref 30.0–36.0)
MCV: 82 fL (ref 78.0–100.0)
PLATELETS: 345 10*3/uL (ref 150–400)
RBC: 3.45 MIL/uL — AB (ref 4.22–5.81)
RDW: 17 % — AB (ref 11.5–15.5)
WBC: 8.8 10*3/uL (ref 4.0–10.5)

## 2015-07-17 LAB — CARBOXYHEMOGLOBIN
CARBOXYHEMOGLOBIN: 1.8 % — AB (ref 0.5–1.5)
Methemoglobin: 0.7 % (ref 0.0–1.5)
O2 SAT: 57.8 %
Total hemoglobin: 8.9 g/dL — ABNORMAL LOW (ref 13.5–18.0)

## 2015-07-17 LAB — GLUCOSE, CAPILLARY
GLUCOSE-CAPILLARY: 116 mg/dL — AB (ref 65–99)
GLUCOSE-CAPILLARY: 140 mg/dL — AB (ref 65–99)
GLUCOSE-CAPILLARY: 255 mg/dL — AB (ref 65–99)

## 2015-07-17 LAB — PROTIME-INR
INR: 1.79 — AB (ref 0.00–1.49)
PROTHROMBIN TIME: 20.8 s — AB (ref 11.6–15.2)

## 2015-07-17 SURGERY — ECHOCARDIOGRAM, TRANSESOPHAGEAL
Anesthesia: Moderate Sedation

## 2015-07-17 MED ORDER — BUTAMBEN-TETRACAINE-BENZOCAINE 2-2-14 % EX AERO
INHALATION_SPRAY | CUTANEOUS | Status: DC | PRN
  Administered 2015-07-17: 2 via TOPICAL

## 2015-07-17 MED ORDER — POTASSIUM CHLORIDE CRYS ER 20 MEQ PO TBCR: 40.0000 meq | EXTENDED_RELEASE_TABLET | Freq: Two times a day (BID) | ORAL | Status: DC

## 2015-07-17 MED ORDER — SODIUM CHLORIDE 0.9 % IV SOLN: INTRAVENOUS | Status: DC

## 2015-07-17 MED ORDER — INSULIN ASPART 100 UNIT/ML ~~LOC~~ SOLN: 0.0000 [IU] | Freq: Every day | SUBCUTANEOUS | Status: DC

## 2015-07-17 MED ORDER — POTASSIUM CHLORIDE CRYS ER 20 MEQ PO TBCR
40.0000 meq | EXTENDED_RELEASE_TABLET | Freq: Two times a day (BID) | ORAL | Status: DC
  Administered 2015-07-17 – 2015-07-18 (×3): 40 meq via ORAL
  Filled 2015-07-17 (×3): qty 2

## 2015-07-17 MED ORDER — MIDAZOLAM HCL 10 MG/2ML IJ SOLN
INTRAMUSCULAR | Status: DC | PRN
  Administered 2015-07-17: 2 mg via INTRAVENOUS
  Administered 2015-07-17: 1 mg via INTRAVENOUS

## 2015-07-17 MED ORDER — TORSEMIDE 20 MG PO TABS
60.0000 mg | ORAL_TABLET | Freq: Two times a day (BID) | ORAL | Status: DC
  Administered 2015-07-17 – 2015-07-18 (×2): 60 mg via ORAL
  Filled 2015-07-17 (×2): qty 3

## 2015-07-17 MED ORDER — POTASSIUM CHLORIDE CRYS ER 20 MEQ PO TBCR
40.0000 meq | EXTENDED_RELEASE_TABLET | Freq: Once | ORAL | Status: AC
  Administered 2015-07-17: 40 meq via ORAL
  Filled 2015-07-17: qty 2

## 2015-07-17 MED ORDER — INSULIN ASPART 100 UNIT/ML ~~LOC~~ SOLN
0.0000 [IU] | Freq: Three times a day (TID) | SUBCUTANEOUS | Status: DC
  Administered 2015-07-18: 3 [IU] via SUBCUTANEOUS

## 2015-07-17 MED ORDER — INSULIN ASPART 100 UNIT/ML ~~LOC~~ SOLN
0.0000 [IU] | SUBCUTANEOUS | Status: DC
  Administered 2015-07-17: 5 [IU] via SUBCUTANEOUS

## 2015-07-17 MED ORDER — FENTANYL CITRATE (PF) 100 MCG/2ML IJ SOLN
INTRAMUSCULAR | Status: DC | PRN
  Administered 2015-07-17: 25 ug via INTRAVENOUS

## 2015-07-17 MED ORDER — FENTANYL CITRATE (PF) 100 MCG/2ML IJ SOLN
INTRAMUSCULAR | Status: AC
  Filled 2015-07-17: qty 2

## 2015-07-17 MED ORDER — CARVEDILOL 3.125 MG PO TABS
3.1250 mg | ORAL_TABLET | Freq: Two times a day (BID) | ORAL | Status: DC
  Administered 2015-07-17 – 2015-07-18 (×3): 3.125 mg via ORAL
  Filled 2015-07-17 (×3): qty 1

## 2015-07-17 MED ORDER — WARFARIN SODIUM 2.5 MG PO TABS
2.5000 mg | ORAL_TABLET | Freq: Once | ORAL | Status: AC
  Administered 2015-07-17: 2.5 mg via ORAL
  Filled 2015-07-17: qty 1

## 2015-07-17 MED ORDER — MIDAZOLAM HCL 5 MG/ML IJ SOLN
INTRAMUSCULAR | Status: AC
  Filled 2015-07-17: qty 2

## 2015-07-17 MED ORDER — SPIRONOLACTONE 25 MG PO TABS
25.0000 mg | ORAL_TABLET | Freq: Every day | ORAL | Status: DC
  Administered 2015-07-17 – 2015-07-18 (×2): 25 mg via ORAL
  Filled 2015-07-17 (×2): qty 1

## 2015-07-17 NOTE — Progress Notes (Signed)
Advanced Heart Failure Rounding Note  PCP: None Primary Cardiologist: Dr Daneen Schick Primary HF: New this admission (Dr. Aundra Dubin)  Subjective:    Yesterday nitro drip stopped and started on bidil + spiro. He continued to diurese with lasix drip. Negative 3.5. PICC placed.   Transferred to stepdown. Denies SOB/Orthopnea.    CO-OX 58%    Objective:   Weight Range: 203 lb 11.3 oz (92.4 kg) Body mass index is 28.42 kg/(m^2).   Vital Signs:   Temp:  [98.4 F (36.9 C)-99.8 F (37.7 C)] 99.7 F (37.6 C) (11/10 0420) Pulse Rate:  [71-79] 77 (11/10 0422) Resp:  [15-25] 16 (11/10 0422) BP: (100-133)/(38-61) 130/58 mmHg (11/10 0422) SpO2:  [88 %-99 %] 88 % (11/10 0422)    Weight change: Filed Weights   07/15/15 0550 07/16/15 0700  Weight: 210 lb (95.255 kg) 203 lb 11.3 oz (92.4 kg)    Intake/Output:   Intake/Output Summary (Last 24 hours) at 07/17/15 0704 Last data filed at 07/17/15 0451  Gross per 24 hour  Intake 445.45 ml  Output   4010 ml  Net -3564.55 ml     Physical Exam: CVP ~6 General:  Elderly appearing. In bed.  HEENT: normal Neck: supple. JVP 5-6 . Carotids 2+ bilat; No thyromegaly appreciated. Cor: PMI nondisplaced. Regular rate & rhythm. II/VI MR murmur Lungs: Clear Abdomen: Obese, soft, nontender, nondistended. No HSM. No bruits or masses. +BS Extremities: no cyanosis, clubbing, rash. No edema. RUE PICC Neuro: alert & orientedx3, cranial nerves grossly intact. moves all 4 extremities w/o difficulty. Affect pleasant  Telemetry:  NSR in 80s  Labs: CBC  Recent Labs  07/16/15 0100 07/17/15 0515  WBC 9.4 8.8  HGB 9.3* 9.0*  HCT 30.4* 28.3*  MCV 82.4 82.0  PLT 370 123456   Basic Metabolic Panel  Recent Labs  07/16/15 0100 07/17/15 0515  NA 135 131*  K 3.5 2.9*  CL 99* 91*  CO2 28 31  GLUCOSE 150* 154*  BUN 25* 28*  CREATININE 1.74* 1.85*  CALCIUM 8.8* 8.6*   Liver Function Tests No results for input(s): AST, ALT, ALKPHOS, BILITOT,  PROT, ALBUMIN in the last 72 hours. No results for input(s): LIPASE, AMYLASE in the last 72 hours. Cardiac Enzymes No results for input(s): CKTOTAL, CKMB, CKMBINDEX, TROPONINI in the last 72 hours.  BNP: BNP (last 3 results)  Recent Labs  07/15/15 2002  BNP 1103.9*    ProBNP (last 3 results) No results for input(s): PROBNP in the last 8760 hours.   D-Dimer No results for input(s): DDIMER in the last 72 hours. Hemoglobin A1C No results for input(s): HGBA1C in the last 72 hours. Fasting Lipid Panel No results for input(s): CHOL, HDL, LDLCALC, TRIG, CHOLHDL, LDLDIRECT in the last 72 hours. Thyroid Function Tests  Recent Labs  07/15/15 2002  TSH 1.094    Other results:     Imaging/Studies:  No results found.  Latest Cath LHC 07/15/15 Severe native CAD with patent grafts. See op note for full report.  RHC 07/15/15 Fick Cardiac Output 4.71 L/min  Fick Cardiac Output Index 2.19 (L/min)/BSA  RA A Wave 23 mmHg  RA V Wave 21 mmHg  RA Mean 18 mmHg  RV Systolic Pressure 61 mmHg  RV Diastolic Pressure 14 mmHg  RV EDP 21 mmHg  PA Systolic Pressure 62 mmHg  PA Diastolic Pressure 28 mmHg  PA Mean 43 mmHg  PW A Wave 37 mmHg  PW V Wave 52 mmHg  PW Mean 36 mmHg  AO  Systolic Pressure [id number] mmHg  AO Diastolic Pressure 50 mmHg  AO Mean 76 mmHg  LV Systolic Pressure [id number] mmHg  LV Diastolic Pressure 20 mmHg  LV EDP 30 mmHg  Arterial Occlusion Pressure Extended Systolic Pressure 123XX123 mmHg  Arterial Occlusion Pressure Extended Diastolic Pressure 48 mmHg  Arterial Occlusion Pressure Extended Mean Pressure 74 mmHg  Left Ventricular Apex Extended Systolic Pressure [id number] mmHg  Left Ventricular Apex Extended Diastolic Pressure 12 mmHg  Left Ventricular Apex Extended EDP Pressure 31 mmHg  QP/QS 1  TPVR Index 19.64 HRUI  TSVR Index 34.7 HRUI  PVR SVR Ratio 0.17  TPVR/TSVR Ratio 0.57     Medications:     Scheduled Medications: . amiodarone  200 mg Oral QHS  . aspirin  81 mg  Oral Daily  . enoxaparin (LOVENOX) injection  40 mg Subcutaneous Q24H  . isosorbide-hydrALAZINE  1 tablet Oral TID  .  morphine injection  2 mg Intravenous Once  . potassium chloride  20 mEq Oral BID  . rosuvastatin  5 mg Oral QHS  . sodium chloride  10-40 mL Intracatheter Q12H  . sodium chloride  3 mL Intravenous Q12H  . spironolactone  12.5 mg Oral Daily  . Warfarin - Pharmacist Dosing Inpatient   Does not apply q1800    Infusions: . furosemide (LASIX) infusion 10 mg/hr (07/16/15 1725)  . furosemide (LASIX) infusion      PRN Medications: sodium chloride, acetaminophen, ondansetron (ZOFRAN) IV, oxyCODONE-acetaminophen, sodium chloride, sodium chloride   Assessment/Plan  74 yo with history of CAD s/p CABG, paroxysmal atrial fibrillation, CKD stage III, and bioprosthetic aortic valve was admitted after cardiac cath with marked volume overload.  He was recently noted to have fall in EF to 35%.  1. Acute on chronic systolic CHF:  Ischemic cardiomyopathy.  ECHO 07/09/15 LVEF 35%, High LV filling pressure, diffuse hypokinesis, Mild/Mod bioprosthetic AR, at least moderte MR, RV mildly reduced, PA Peak pressure 51 mm Hg.  RHC yesterday with markedly elevated right and left heart filling pressures, admitted for diuresis.  He developed acute  flash pulmonary edema post-cath. Brisk diuresis noted. CVP 5-6. CO-OX 58%.  Appears euvolemic.  - Stop Lasix drip. Start torsemide this evening.  - Continue bidil 1 tab tid.  - Increase sprio to 25 mg daily.  - Add 3.125 mg carvedilol  2. Acute Respiratory Failure: Required Bipap 11/8 weaned off to nasal cannula. Resolved.  3. HTN: Off NTG gtt.  4. CAD: Stable, patent grafts on cardiac cath this admission.  Worsening CAD does not explain fall in EF.  Continue Crestor, not on long-term ASA given warfarin use.    5. Pulmonary venous hypertension: Noted on RHC.  Diuresed with lasix drip. .  6. Aortic valve bioprosthesis: Mild to moderate AI on last echo.   7.  Mitral regurgitation: At least moderate on last echo.  Large v waves on RHC due to volume overload versus MR.  Set up TEE.   8. CKD: Follow creatinine closely with diuresis and contrast. Creatinine today up to 1.87  9. Atrial fibrillation: Paroxysmal.  INR today 1.79.  Pharmacy to dose.warfarin and continue amiodarone for maintenance of NSR.  10. Hypokalemia  Continue to mobilize.    Length of Stay: 2  Amy Clegg NP-C  07/17/2015, 7:04 AM  Advanced Heart Failure Team Pager [phone] (M-F; 7a - 4p)  Please contact [facility] for night-coverage after hours (4p -7a ) and weekends on [email or web address]  Patient seen with NP, agree with the above note.  CVP now 5, will transition to torsemide 60 mg bid (was on Lasix 120 mg bid at home).  Increase spironolactone, can add low dose Coreg.   For TEE today now that volume better controlled for assessment of MR.   Possibly home tomorrow.  Loralie Champagne 07/17/2015 8:02 AM

## 2015-07-17 NOTE — Progress Notes (Signed)
  Echocardiogram Echocardiogram Transesophageal has been performed.  Bobbye Charleston 07/17/2015, 2:05 PM

## 2015-07-17 NOTE — Progress Notes (Signed)
Pt not in any distress upon entering pt room, pt tolerating nasal cannula well at this time, all vital signs within normal range. RT will continue to monitor

## 2015-07-17 NOTE — CV Procedure (Signed)
Procedure: TEE  Indication: MR/AI  Sedation: Versed 3 mg IV, Fentanyl 25 mcg IV  Findings: Please see echo section for full report.  The left ventricle was mild to moderately dilated.  Mild LV hypertrophy.  EF 35% with diffuse hypokinesis, worse in the mid to apical septal wall.  Normal RV size with mildly decreased systolic function.  Peak RV-RA gradient 33 mmHg.  The left atrium was mildly dilated.  Normal right atrial size.  The mitral valve was mildly thickened with some restriction of the posterior leaflet.  No mitral stenosis, moderate mitral regurgitation with ERO 0.25 cm^2.  Trivial TR.  There was a bioprosthetic aortic valve.  Gradient was not increased across this valve.  There was moderate eccentric peri-valvular aortic insufficiency (difficult to quantify given eccentricity).  I did not see holodiastolic flow reversal in the ascending aorta.  There was grade III plaque in the descending thoracic aorta.   Impression: Moderate MR and moderate peri-valvular bioprosthetic aortic valve regurgitation.   [name] [name] 07/17/2015 1:58 PM

## 2015-07-17 NOTE — Plan of Care (Signed)
Problem: Education: Goal: Ability to verbalize understanding of medication therapies will improve Outcome: Completed/Met Date Met:  07/17/15 Discussed isosorbide/hydralazine (BIDIL) combination therapy, amidarone (PACERONE), rosuvastatin (CRESTOR), spironolactone (ALDACTONE), torsemide (DEMADEX) and carvedilol (COREG) with patient for heart failure and pulmonary hypertension management. At this time, he verbalized understanding regarding importance of adhering to medication regimen at home. Likely will need reinforcement prior to discharge.

## 2015-07-17 NOTE — Progress Notes (Signed)
ANTICOAGULATION CONSULT NOTE - Follow Up Consult  Pharmacy Consult for coumadin Indication: atrial fibrillation  No Known Allergies  Patient Measurements: Height: 5\' 11"  (180.3 cm) Weight: 203 lb 11.3 oz (92.4 kg) IBW/kg (Calculated) : 75.3   Vital Signs: Temp: 99 F (37.2 C) (11/10 0756) Temp Source: Oral (11/10 0756) BP: 116/55 mmHg (11/10 0830) Pulse Rate: 75 (11/10 0830)  Labs:  Recent Labs  07/15/15 0735 07/16/15 0100 07/17/15 0515  HGB 9.5* 9.3* 9.0*  HCT 31.0* 30.4* 28.3*  PLT 337 370 345  LABPROT 19.9* 18.9* 20.8*  INR 1.69* 1.58* 1.79*  CREATININE 1.67* 1.74* 1.85*    Estimated Creatinine Clearance: 41.9 mL/min (by C-G formula based on Cr of 1.85).   Medical History: Past Medical History  Diagnosis Date  . Coronary artery disease   . Angina   . Hypertension   . Atrial flutter ([place])   . Obstructive sleep apnea     severe,states had test, no machine, used oxygen in past, not now   Assessment: 74 y.o. male followed with hx of chronic combined CHF ECHO 07/09/15 LVEF 35%.  Patient was on coumadin pta with slight supratherapeutic level last month, patient instructed to hold dose 4 days prior to cath on 11/8. INR on admission was 1.69.   PTA dose = 2.5mg  daily except 5mg  on MWF  INR 1.79 today. Hgb 9.0 stable, plt wnl. Orders received to resume warfarin and start sq lovenox for px, will d/c if/when INR is >2.  Pta dose 2.5mg  daily except 5mg  mwf  Goal of Therapy:  INR 2-3 Monitor platelets by anticoagulation protocol: Yes   Plan:  Warfarin 2.5mg  tonight Daily INR Monitor s/sx bleeding Stop lovenox when INR>2   Ramsay Bognar C. Lennox Grumbles, PharmD Pharmacy Resident  Pager: [phone] 07/17/2015 10:26 AM

## 2015-07-17 NOTE — H&P (View-Only) (Signed)
Advanced Heart Failure Rounding Note  PCP: None Primary Cardiologist: Dr Daneen Schick Primary HF: New this admission (Dr. Aundra Dubin)  Subjective:    Yesterday nitro drip stopped and started on bidil + spiro. He continued to diurese with lasix drip. Negative 3.5. PICC placed.   Transferred to stepdown. Denies SOB/Orthopnea.    CO-OX 58%    Objective:   Weight Range: 203 lb 11.3 oz (92.4 kg) Body mass index is 28.42 kg/(m^2).   Vital Signs:   Temp:  [98.4 F (36.9 C)-99.8 F (37.7 C)] 99.7 F (37.6 C) (11/10 0420) Pulse Rate:  [71-79] 77 (11/10 0422) Resp:  [15-25] 16 (11/10 0422) BP: (100-133)/(38-61) 130/58 mmHg (11/10 0422) SpO2:  [88 %-99 %] 88 % (11/10 0422)    Weight change: Filed Weights   07/15/15 0550 07/16/15 0700  Weight: 210 lb (95.255 kg) 203 lb 11.3 oz (92.4 kg)    Intake/Output:   Intake/Output Summary (Last 24 hours) at 07/17/15 0704 Last data filed at 07/17/15 0451  Gross per 24 hour  Intake 445.45 ml  Output   4010 ml  Net -3564.55 ml     Physical Exam: CVP ~6 General:  Elderly appearing. In bed.  HEENT: normal Neck: supple. JVP 5-6 . Carotids 2+ bilat; No thyromegaly appreciated. Cor: PMI nondisplaced. Regular rate & rhythm. II/VI MR murmur Lungs: Clear Abdomen: Obese, soft, nontender, nondistended. No HSM. No bruits or masses. +BS Extremities: no cyanosis, clubbing, rash. No edema. RUE PICC Neuro: alert & orientedx3, cranial nerves grossly intact. moves all 4 extremities w/o difficulty. Affect pleasant  Telemetry:  NSR in 80s  Labs: CBC  Recent Labs  07/16/15 0100 07/17/15 0515  WBC 9.4 8.8  HGB 9.3* 9.0*  HCT 30.4* 28.3*  MCV 82.4 82.0  PLT 370 123456   Basic Metabolic Panel  Recent Labs  07/16/15 0100 07/17/15 0515  NA 135 131*  K 3.5 2.9*  CL 99* 91*  CO2 28 31  GLUCOSE 150* 154*  BUN 25* 28*  CREATININE 1.74* 1.85*  CALCIUM 8.8* 8.6*   Liver Function Tests No results for input(s): AST, ALT, ALKPHOS, BILITOT,  PROT, ALBUMIN in the last 72 hours. No results for input(s): LIPASE, AMYLASE in the last 72 hours. Cardiac Enzymes No results for input(s): CKTOTAL, CKMB, CKMBINDEX, TROPONINI in the last 72 hours.  BNP: BNP (last 3 results)  Recent Labs  07/15/15 2002  BNP 1103.9*    ProBNP (last 3 results) No results for input(s): PROBNP in the last 8760 hours.   D-Dimer No results for input(s): DDIMER in the last 72 hours. Hemoglobin A1C No results for input(s): HGBA1C in the last 72 hours. Fasting Lipid Panel No results for input(s): CHOL, HDL, LDLCALC, TRIG, CHOLHDL, LDLDIRECT in the last 72 hours. Thyroid Function Tests  Recent Labs  07/15/15 2002  TSH 1.094    Other results:     Imaging/Studies:  No results found.  Latest Cath LHC 07/15/15 Severe native CAD with patent grafts. See op note for full report.  RHC 07/15/15 Fick Cardiac Output 4.71 L/min  Fick Cardiac Output Index 2.19 (L/min)/BSA  RA A Wave 23 mmHg  RA V Wave 21 mmHg  RA Mean 18 mmHg  RV Systolic Pressure 61 mmHg  RV Diastolic Pressure 14 mmHg  RV EDP 21 mmHg  PA Systolic Pressure 62 mmHg  PA Diastolic Pressure 28 mmHg  PA Mean 43 mmHg  PW A Wave 37 mmHg  PW V Wave 52 mmHg  PW Mean 36 mmHg  AO  Systolic Pressure [id number] mmHg  AO Diastolic Pressure 50 mmHg  AO Mean 76 mmHg  LV Systolic Pressure [id number] mmHg  LV Diastolic Pressure 20 mmHg  LV EDP 30 mmHg  Arterial Occlusion Pressure Extended Systolic Pressure 123XX123 mmHg  Arterial Occlusion Pressure Extended Diastolic Pressure 48 mmHg  Arterial Occlusion Pressure Extended Mean Pressure 74 mmHg  Left Ventricular Apex Extended Systolic Pressure [id number] mmHg  Left Ventricular Apex Extended Diastolic Pressure 12 mmHg  Left Ventricular Apex Extended EDP Pressure 31 mmHg  QP/QS 1  TPVR Index 19.64 HRUI  TSVR Index 34.7 HRUI  PVR SVR Ratio 0.17  TPVR/TSVR Ratio 0.57     Medications:     Scheduled Medications: . amiodarone  200 mg Oral QHS  . aspirin  81 mg  Oral Daily  . enoxaparin (LOVENOX) injection  40 mg Subcutaneous Q24H  . isosorbide-hydrALAZINE  1 tablet Oral TID  .  morphine injection  2 mg Intravenous Once  . potassium chloride  20 mEq Oral BID  . rosuvastatin  5 mg Oral QHS  . sodium chloride  10-40 mL Intracatheter Q12H  . sodium chloride  3 mL Intravenous Q12H  . spironolactone  12.5 mg Oral Daily  . Warfarin - Pharmacist Dosing Inpatient   Does not apply q1800    Infusions: . furosemide (LASIX) infusion 10 mg/hr (07/16/15 1725)  . furosemide (LASIX) infusion      PRN Medications: sodium chloride, acetaminophen, ondansetron (ZOFRAN) IV, oxyCODONE-acetaminophen, sodium chloride, sodium chloride   Assessment/Plan  74 yo with history of CAD s/p CABG, paroxysmal atrial fibrillation, CKD stage III, and bioprosthetic aortic valve was admitted after cardiac cath with marked volume overload.  He was recently noted to have fall in EF to 35%.  1. Acute on chronic systolic CHF:  Ischemic cardiomyopathy.  ECHO 07/09/15 LVEF 35%, High LV filling pressure, diffuse hypokinesis, Mild/Mod bioprosthetic AR, at least moderte MR, RV mildly reduced, PA Peak pressure 51 mm Hg.  RHC yesterday with markedly elevated right and left heart filling pressures, admitted for diuresis.  He developed acute  flash pulmonary edema post-cath. Brisk diuresis noted. CVP 5-6. CO-OX 58%.  Appears euvolemic.  - Stop Lasix drip. Start torsemide this evening.  - Continue bidil 1 tab tid.  - Increase sprio to 25 mg daily.  - Add 3.125 mg carvedilol  2. Acute Respiratory Failure: Required Bipap 11/8 weaned off to nasal cannula. Resolved.  3. HTN: Off NTG gtt.  4. CAD: Stable, patent grafts on cardiac cath this admission.  Worsening CAD does not explain fall in EF.  Continue Crestor, not on long-term ASA given warfarin use.    5. Pulmonary venous hypertension: Noted on RHC.  Diuresed with lasix drip. .  6. Aortic valve bioprosthesis: Mild to moderate AI on last echo.   7.  Mitral regurgitation: At least moderate on last echo.  Large v waves on RHC due to volume overload versus MR.  Set up TEE.   8. CKD: Follow creatinine closely with diuresis and contrast. Creatinine today up to 1.87  9. Atrial fibrillation: Paroxysmal.  INR today 1.79.  Pharmacy to dose.warfarin and continue amiodarone for maintenance of NSR.  10. Hypokalemia  Continue to mobilize.    Length of Stay: 2  Amy Clegg NP-C  07/17/2015, 7:04 AM  Advanced Heart Failure Team Pager [phone] (M-F; 7a - 4p)  Please contact [facility] for night-coverage after hours (4p -7a ) and weekends on [email or web address]  Patient seen with NP, agree with the above note.  CVP now 5, will transition to torsemide 60 mg bid (was on Lasix 120 mg bid at home).  Increase spironolactone, can add low dose Coreg.   For TEE today now that volume better controlled for assessment of MR.   Possibly home tomorrow.  Loralie Champagne 07/17/2015 8:02 AM

## 2015-07-17 NOTE — Interval H&P Note (Signed)
History and Physical Interval Note:  07/17/2015 1:32 PM  [name] [name]  has presented today for surgery, with the diagnosis of mitral regurgitation  The various methods of treatment have been discussed with the patient and family. After consideration of risks, benefits and other options for treatment, the patient has consented to  Procedure(s): TRANSESOPHAGEAL ECHOCARDIOGRAM (TEE) (N/A) as a surgical intervention .  The patient's history has been reviewed, patient examined, no change in status, stable for surgery.  I have reviewed the patient's chart and labs.  Questions were answered to the patient's satisfaction.     Mac Dowdell [employer]

## 2015-07-17 NOTE — Progress Notes (Signed)
CARDIAC REHAB PHASE I   PRE:  Rate/Rhythm: 63 SR  BP:  Sitting: 115/49        SaO2: 96 3L  MODE:  Ambulation: 1050 ft   POST:  Rate/Rhythm: 78 SR  BP:  Sitting: 126/56         SaO2: 100 3L  Pt ambulated 1050 ft on 3L O2, IV pole, foley catheter, hand held assist, steady gait, tolerated well. Pt denies DOE, cp, dizziness, declined rest stop. Pt increased ambulation distance today with improved activity tolerance, requiring less oxygen. Pt states he is feeling better today. Reviewed exercise guidelines and phase 2 cardiac rehab with pt and wife. Pt verbalized understanding. Pt agrees to phase 2 referral, will send to [facility]. Pt to recliner after walk, feet elevated, call bell within reach. Will follow.  SU:[id number]  Lenna Sciara, RN, BSN 07/17/2015 10:22 AM

## 2015-07-18 ENCOUNTER — Encounter (HOSPITAL_COMMUNITY): Payer: Self-pay | Admitting: Cardiology

## 2015-07-18 LAB — GLUCOSE, CAPILLARY
GLUCOSE-CAPILLARY: 123 mg/dL — AB (ref 65–99)
GLUCOSE-CAPILLARY: 202 mg/dL — AB (ref 65–99)

## 2015-07-18 LAB — BASIC METABOLIC PANEL
Anion gap: 9 (ref 5–15)
BUN: 34 mg/dL — AB (ref 6–20)
CHLORIDE: 95 mmol/L — AB (ref 101–111)
CO2: 30 mmol/L (ref 22–32)
CREATININE: 1.92 mg/dL — AB (ref 0.61–1.24)
Calcium: 8.7 mg/dL — ABNORMAL LOW (ref 8.9–10.3)
GFR calc non Af Amer: 33 mL/min — ABNORMAL LOW (ref >60)
GFR, EST AFRICAN AMERICAN: 39 mL/min — AB (ref >60)
GLUCOSE: 124 mg/dL — AB (ref 65–99)
Potassium: 3.5 mmol/L (ref 3.5–5.1)
Sodium: 134 mmol/L — ABNORMAL LOW (ref 135–145)

## 2015-07-18 LAB — CARBOXYHEMOGLOBIN
CARBOXYHEMOGLOBIN: 2.5 % — AB (ref 0.5–1.5)
Carboxyhemoglobin: 1.7 % — ABNORMAL HIGH (ref 0.5–1.5)
METHEMOGLOBIN: 0.5 % (ref 0.0–1.5)
Methemoglobin: 0.8 % (ref 0.0–1.5)
O2 Saturation: 99.7 %
O2 Saturation: 52.9 %
TOTAL HEMOGLOBIN: 9.3 g/dL — AB (ref 13.5–18.0)
Total hemoglobin: 8.9 g/dL — ABNORMAL LOW (ref 13.5–18.0)

## 2015-07-18 LAB — PROTIME-INR
INR: 1.63 — AB (ref 0.00–1.49)
Prothrombin Time: 19.3 seconds — ABNORMAL HIGH (ref 11.6–15.2)

## 2015-07-18 MED ORDER — POTASSIUM CHLORIDE CRYS ER 20 MEQ PO TBCR: 40.0000 meq | EXTENDED_RELEASE_TABLET | Freq: Two times a day (BID) | ORAL | Status: AC

## 2015-07-18 MED ORDER — POTASSIUM CHLORIDE CRYS ER 20 MEQ PO TBCR: 40.0000 meq | EXTENDED_RELEASE_TABLET | ORAL | Status: DC

## 2015-07-18 MED ORDER — ASPIRIN 81 MG PO CHEW: 81.0000 mg | CHEWABLE_TABLET | Freq: Every day | ORAL | Status: DC

## 2015-07-18 MED ORDER — CARVEDILOL 3.125 MG PO TABS: 3.1250 mg | ORAL_TABLET | Freq: Two times a day (BID) | ORAL | Status: AC

## 2015-07-18 MED ORDER — TORSEMIDE 20 MG PO TABS: 60.0000 mg | ORAL_TABLET | Freq: Two times a day (BID) | ORAL | Status: DC

## 2015-07-18 MED ORDER — WARFARIN SODIUM 5 MG PO TABS: ORAL_TABLET | ORAL | Status: AC

## 2015-07-18 MED ORDER — WARFARIN SODIUM 5 MG PO TABS: 5.0000 mg | ORAL_TABLET | Freq: Once | ORAL | Status: DC

## 2015-07-18 MED ORDER — ISOSORB DINITRATE-HYDRALAZINE 20-37.5 MG PO TABS: 1.0000 | ORAL_TABLET | Freq: Three times a day (TID) | ORAL | Status: DC

## 2015-07-18 NOTE — Progress Notes (Signed)
Pt given D/C instructions and verbalizes understanding.  Pt. D/c to home with wife via private vehicle.

## 2015-07-18 NOTE — Progress Notes (Signed)
ANTICOAGULATION CONSULT NOTE - Follow Up Consult  Pharmacy Consult for coumadin Indication: atrial fibrillation  No Known Allergies  Patient Measurements: Height: 5\' 11"  (180.3 cm) Weight: 198 lb 13.7 oz (90.2 kg) IBW/kg (Calculated) : 75.3   Vital Signs: Temp: 97.6 F (36.4 C) (11/11 0832) Temp Source: Axillary (11/11 0832) BP: 123/62 mmHg (11/11 0828) Pulse Rate: 74 (11/11 0828)  Labs:  Recent Labs  07/16/15 0100 07/17/15 0515 07/18/15 0719  HGB 9.3* 9.0*  --   HCT 30.4* 28.3*  --   PLT 370 345  --   LABPROT 18.9* 20.8* 19.3*  INR 1.58* 1.79* 1.63*  CREATININE 1.74* 1.85* 1.92*    Estimated Creatinine Clearance: 37 mL/min (by C-G formula based on Cr of 1.92).   Medical History: Past Medical History  Diagnosis Date  . Coronary artery disease   . Angina   . Hypertension   . Atrial flutter ([place])   . Obstructive sleep apnea     severe,states had test, no machine, used oxygen in past, not now   Assessment: 74 y.o. male followed with hx of chronic combined CHF ECHO 07/09/15 LVEF 35%.  Patient was on coumadin pta with slight supratherapeutic level last month, patient instructed to hold dose 4 days prior to cath on 11/8. INR on admission was 1.69.   PTA dose = 2.5mg  daily except 5mg  on MWF  INR 1.63 today. Hgb 9.0 stable, plt wnl. Orders received to resume warfarin and start sq lovenox for px, will d/c if/when INR is >2. No documented bleeding.   Pta dose 2.5mg  daily except 5mg  mwf  Goal of Therapy:  INR 2-3 Monitor platelets by anticoagulation protocol: Yes   Plan:  Warfarin 5mg  tonight Daily INR Monitor s/sx bleeding Stop lovenox when INR>2   Kaysey Berndt C. Lennox Grumbles, PharmD Pharmacy Resident  Pager: [phone] 07/18/2015 9:28 AM

## 2015-07-18 NOTE — Progress Notes (Signed)
md order to ck on coverage for med bidil. Spoke w pt and wife. Pt has medicare d plan for meds.

## 2015-07-18 NOTE — Progress Notes (Signed)
Advanced Heart Failure Rounding Note  PCP: None Primary Cardiologist: Dr Daneen Schick Primary HF: New this admission (Dr. Aundra Dubin)  Subjective:    Yesterday spiro increased, nitro drp stopped, and carvedilol added.   Denies SOB/Orthopnea.   BMET pending.   CO-OX 58%    Objective:   Weight Range: 203 lb (92.08 kg) Body mass index is 28.33 kg/(m^2).   Vital Signs:   Temp:  [98.2 F (36.8 C)-99.1 F (37.3 C)] 98.7 F (37.1 C) (11/11 0409) Pulse Rate:  [57-93] 66 (11/11 0400) Resp:  [13-27] 13 (11/11 0400) BP: (101-127)/(44-64) 112/50 mmHg (11/11 0400) SpO2:  [92 %-100 %] 95 % (11/11 0400) Weight:  [203 lb (92.08 kg)] 203 lb (92.08 kg) (11/10 1245) Last BM Date: 07/15/15  Weight change: Filed Weights   07/15/15 0550 07/16/15 0700 07/17/15 1245  Weight: 210 lb (95.255 kg) 203 lb 11.3 oz (92.4 kg) 203 lb (92.08 kg)    Intake/Output:   Intake/Output Summary (Last 24 hours) at 07/18/15 0701 Last data filed at 07/18/15 0412  Gross per 24 hour  Intake   1182 ml  Output   2375 ml  Net  -1193 ml     Physical Exam: CVP ~5-6  General:  Elderly appearing. In bed.  HEENT: normal Neck: supple. JVP 5-6 . Carotids 2+ bilat; No thyromegaly appreciated. Cor: PMI nondisplaced. Regular rate & rhythm. II/VI MR murmur Lungs: Clear Abdomen: Obese, soft, nontender, nondistended. No HSM. No bruits or masses. +BS Extremities: no cyanosis, clubbing, rash. No edema. RUE PICC Neuro: alert & orientedx3, cranial nerves grossly intact. moves all 4 extremities w/o difficulty. Affect pleasant  Telemetry:  NSR in 60s  Labs: CBC  Recent Labs  07/16/15 0100 07/17/15 0515  WBC 9.4 8.8  HGB 9.3* 9.0*  HCT 30.4* 28.3*  MCV 82.4 82.0  PLT 370 [id number]   Basic Metabolic Panel  Recent Labs  07/16/15 0100 07/17/15 0515  NA 135 131*  K 3.5 2.9*  CL 99* 91*  CO2 28 31  GLUCOSE 150* 154*  BUN 25* 28*  CREATININE 1.74* 1.85*  CALCIUM 8.8* 8.6*   Liver Function Tests No results for  input(s): AST, ALT, ALKPHOS, BILITOT, PROT, ALBUMIN in the last 72 hours. No results for input(s): LIPASE, AMYLASE in the last 72 hours. Cardiac Enzymes No results for input(s): CKTOTAL, CKMB, CKMBINDEX, TROPONINI in the last 72 hours.  BNP: BNP (last 3 results)  Recent Labs  07/15/15 2002  BNP 1103.9*    ProBNP (last 3 results) No results for input(s): PROBNP in the last 8760 hours.   D-Dimer No results for input(s): DDIMER in the last 72 hours. Hemoglobin A1C No results for input(s): HGBA1C in the last 72 hours. Fasting Lipid Panel No results for input(s): CHOL, HDL, LDLCALC, TRIG, CHOLHDL, LDLDIRECT in the last 72 hours. Thyroid Function Tests  Recent Labs  07/15/15 2002  TSH 1.094    Other results:     Imaging/Studies:  No results found.  Latest Cath LHC 07/15/15 Severe native CAD with patent grafts. See op note for full report.  RHC 07/15/15 Fick Cardiac Output 4.71 L/min  Fick Cardiac Output Index 2.19 (L/min)/BSA  RA A Wave 23 mmHg  RA V Wave 21 mmHg  RA Mean 18 mmHg  RV Systolic Pressure 61 mmHg  RV Diastolic Pressure 14 mmHg  RV EDP 21 mmHg  PA Systolic Pressure 62 mmHg  PA Diastolic Pressure 28 mmHg  PA Mean 43 mmHg  PW A Wave 37 mmHg  PW V  Wave 52 mmHg  PW Mean 36 mmHg  AO Systolic Pressure [id number] mmHg  AO Diastolic Pressure 50 mmHg  AO Mean 76 mmHg  LV Systolic Pressure [id number] mmHg  LV Diastolic Pressure 20 mmHg  LV EDP 30 mmHg  Arterial Occlusion Pressure Extended Systolic Pressure 123XX123 mmHg  Arterial Occlusion Pressure Extended Diastolic Pressure 48 mmHg  Arterial Occlusion Pressure Extended Mean Pressure 74 mmHg  Left Ventricular Apex Extended Systolic Pressure [id number] mmHg  Left Ventricular Apex Extended Diastolic Pressure 12 mmHg  Left Ventricular Apex Extended EDP Pressure 31 mmHg  QP/QS 1  TPVR Index 19.64 HRUI  TSVR Index 34.7 HRUI  PVR SVR Ratio 0.17  TPVR/TSVR Ratio 0.57     Medications:     Scheduled Medications: .  amiodarone  200 mg Oral QHS  . aspirin  81 mg Oral Daily  . carvedilol  3.125 mg Oral BID WC  . enoxaparin (LOVENOX) injection  40 mg Subcutaneous Q24H  . insulin aspart  0-5 Units Subcutaneous QHS  . insulin aspart  0-9 Units Subcutaneous TID WC  . isosorbide-hydrALAZINE  1 tablet Oral TID  .  morphine injection  2 mg Intravenous Once  . potassium chloride  40 mEq Oral BID  . rosuvastatin  5 mg Oral QHS  . sodium chloride  10-40 mL Intracatheter Q12H  . spironolactone  25 mg Oral Daily  . torsemide  60 mg Oral BID  . Warfarin - Pharmacist Dosing Inpatient   Does not apply q1800    Infusions:    PRN Medications: sodium chloride, acetaminophen, ondansetron (ZOFRAN) IV, oxyCODONE-acetaminophen, sodium chloride   Assessment/Plan  74 yo with history of CAD s/p CABG, paroxysmal atrial fibrillation, CKD stage III, and bioprosthetic aortic valve was admitted after cardiac cath with marked volume overload.  He was recently noted to have fall in EF to 35%.  1. Acute on chronic systolic CHF:  Ischemic cardiomyopathy.  ECHO 07/09/15 LVEF 35%, High LV filling pressure, diffuse hypokinesis, Mild/Mod bioprosthetic AR, at least moderate MR, RV mildly reduced, PA Peak pressure 51 mm Hg.  RHC yesterday with markedly elevated right and left heart filling pressures, admitted for diuresis.  He developed acute  flash pulmonary edema post-cath. This morning, CO-OX 58%.  Appears euvolemic with CVP 5-6 .  - Off lasix drip. On torsemide 60 mg twice daily - Continue bidil 1 tab tid.  - Continue sprionolactone to 25 mg daily.  - Continue 3.25 mg carvedilol  2. Acute Respiratory Failure: Required Bipap 11/8 weaned off to nasal cannula. Resolved.  3. HTN: Much improved. Continue current regimen.  4. CAD: Stable, patent grafts on cardiac cath this admission.  Worsening CAD does not explain fall in EF.  Continue Crestor, not on long-term ASA given warfarin use.    5. Pulmonary venous hypertension: Noted on RHC.   Diuresed with lasix drip.  6. Aortic valve bioprosthesis: TEE 11/10 with moderate MR and moderate peri-valvular bioprosthetic aortic valve regurgitation.  7.  Mitral regurgitation: Moderate MR and moderate peri-valvular bioprosthetic aortic valve regurgitation 8. CKD: Follow creatinine closely with diuresis and contrast. Creatinine pending.   9. Atrial fibrillation: Paroxysmal.  Pharmacy to dose warfarin and continue amiodarone for maintenance of NSR.  INR pending.  10. Hypokalemia- BMET pending.   Length of Stay: 3  Amy Clegg NP-C  07/18/2015, 7:01 AM  Advanced Heart Failure Team Pager [phone] (M-F; 7a - 4p)  Please contact [facility] for night-coverage after hours (4p -7a ) and weekends on [email or web address]  Patient seen  with NP, agree with the above note.  TEE yesterday showed moderate peri-valvular bioprosthetic AI and moderate MR, EF about 35%.  He is doing well today, co-ox 58% (stable) and CVP 5-6.  I think he can go home.   Meds for home: torsemide 60 mg bid, KCl 40 bid, warfarin, Bidil 1 tab tid, spironolactone 25 daily, Coreg 3.125 mg bid, amiodarone 200 daily, Crestor 5 qhs.  Followup in about 10 days in CHF clinic with BMET, followup as scheduled with Dr Tamala Julian.   Loralie Champagne 07/18/2015

## 2015-07-18 NOTE — Care Management Important Message (Signed)
Important Message  Patient Details  Name: [name] [name] MRN: [id number] Date of Birth: 09-Mar-1941   Medicare Important Message Given:  Yes    Caroll Weinheimer P Arleigh Dicola 07/18/2015, 2:14 PM

## 2015-07-18 NOTE — Progress Notes (Signed)
CARDIAC REHAB PHASE I   PRE:  Rate/Rhythm: 74 SR  BP:  Sitting: 123/62        SaO2: 95 RA  MODE:  Ambulation: 700 ft   POST:  Rate/Rhythm: 80 SR  BP:  Sitting: 136/76         SaO2: 95 RA  Pt sitting on edge of bed, on RA, excited to go home. Pt ambulated 700 ft on RA, IV, independent, tolerated well. Pt denies CP, dizziness, DOE, declined rest stop. Pt sats 97% on RA while ambulating. Reviewed CHF education with pt and wife, discussed importance of medication and diet compliance and daily exercise, pt verbalized understanding. Pt to bed per pt request after walk, call bell within reach, awaiting discharge.  1000-1030   Lenna Sciara, RN, BSN 07/18/2015 1030 AM

## 2015-07-18 NOTE — Care Management Important Message (Signed)
Important Message  Patient Details  Name: [name] [name] MRN: [id number] Date of Birth: 1941/08/19   Medicare Important Message Given:  Yes    Hani Patnode P Asiel Chrostowski 07/18/2015, 2:15 PM

## 2015-07-18 NOTE — Discharge Summary (Signed)
Advanced Heart Failure Team  Discharge Summary   Patient ID: [name] [name] MRN: [id number], DOB/AGE: 1941-02-06 74 y.o. Admit date: 07/15/2015 D/C date:     07/18/2015   Primary Discharge Diagnoses:  1. Acute on chronic systolic CHF: Ischemic cardiomyopathy. 2. Acute Respiratory Failure: Required Bipap 11/8 weaned off to nasal cannula. Resolved.  3. Acute Flash Pulmonary Edema.  4. HTN 5. CAD 6. Pulmonary venous hypertension: Noted on RHC. Diuresed with lasix drip.  7. Aortic valve bioprosthesis:  8. Mitral regurgitation 9. CKD 10. Atrial fibrillation: Paroxysmal.  11. Hypokalemia  Hospital Course:  74 yo with history of CAD s/p CABG, paroxysmal atrial fibrillation, CKD stage III, and bioprosthetic aortic valve was admitted after cardiac cath with marked volume overload. Hospital course was complicated by acute flash pulmonary edema and acute respiratory distress.   1. Acute on chronic systolic CHF: Ischemic cardiomyopathy. ECHO 07/09/15 LVEF 35%, High LV filling pressure, diffuse hypokinesis, Mild/Mod bioprosthetic AR, at least moderate MR, RV mildly reduced, PA Peak pressure 51 mm Hg. Admitted after [place] with marked volume overload. He developed acute flash pulmonary edema post-cath. Transferred to ICU started on nitro drip, lasix drip and placed on Bipap short term.  As he improved he transitioned from lasix drip to torsemide 60 mg twice a day.  Nitroglycerin weaned off as he started on bidil. He will continue bidil at discharge.  He will continue on low dose carvedilol and spironolactone.  2. Acute Respiratory Failure: Required Bipap 11/8 weaned off to nasal cannula and later to room ari.   3. HTN: Much improved. Continue current regimen.  4. CAD: Stable, patent grafts on cardiac cath this admission. Worsening CAD does not explain fall in EF. Continue Crestor, not on long-term ASA given warfarin use.  5. Pulmonary venous hypertension: Noted on RHC. Diuresed with  lasix drip.  6. Aortic valve bioprosthesis: TEE 11/10 with moderate MR and moderate peri-valvular bioprosthetic aortic valve regurgitation.  7. Mitral regurgitation: Moderate MR and moderate peri-valvular bioprosthetic aortic valve regurgitation 8. CKD Stage III : Creatinine was followed closely after LHC. Creatinine on the day of discharge was 1.92.   9. Atrial fibrillation: Paroxysmal.Continue amiodarone for maintenance of NSR. INR on admit sub therapeutic. INR on discharge 1.63.  Coumadin increased to 5 mg daily Mon-Fri and 2.5 mg Sat/Sun. Follow up with coumadin clinic. Check INR next week.  10. Hypokalemia- Potassium has been replaced.   RHC/LHC Results 07/15/2015   Recently documented severe left ventricular systolic dysfunction with an estimated ejection fraction of 30% by echocardiography, November 2016.  Moderate to severe mitral regurgitation by echocardiography. Significant V wave by hemodynamic recordings up to 50 mmHg. Mean pulmonary capillary wedge pressure 36 mmHg.  Severe pulmonary hypertension  Severe native coronary disease with total occlusion of the ostial LAD, proximal circumflex, and segmental 95-99% stenosis in the mid RCA. All native vessels are heavily calcified. The native LAD proximal to the LIMA insertion site is diffusely disease. 2 large diagonal branches are totally occluded.  Widely patent bypass grafts including the saphenous vein graft to the distal RCA, saphenous vein graft to the distal circumflex/obtuse marginal #3, and the LIMA to the LAD.  Status post aortic valve replacement with bioprosthesis and known moderate aortic regurgitation, paravalvular  TEE 07/17/2015 The left ventricle was mild to moderately dilated. Mild LV hypertrophy. EF 35% with diffuse hypokinesis, worse in the mid to apical septal wall. Normal RV size with mildly decreased systolic function. Peak RV-RA gradient 33 mmHg. The left atrium was  mildly dilated. Normal right  atrial size. The mitral valve was mildly thickened with some restriction of the posterior leaflet. No mitral stenosis, moderate mitral regurgitation with ERO 0.25 cm^2. Trivial TR. There was a bioprosthetic aortic valve. Gradient was not increased across this valve. There was moderate eccentric peri-valvular aortic insufficiency (difficult to quantify given eccentricity). I did not see holodiastolic flow reversal in the ascending aorta. There was grade III plaque in the descending thoracic aorta.       Discharge Weight Range: 198 pounds.  Discharge Vitals: Blood pressure 136/76, pulse 65, temperature 97.6 F (36.4 C), temperature source Axillary, resp. rate 13, height 5\' 11"  (1.803 m), weight 198 lb 13.7 oz (90.2 kg), SpO2 97 %.  Labs: Lab Results  Component Value Date   WBC 8.8 07/17/2015   HGB 9.0* 07/17/2015   HCT 28.3* 07/17/2015   MCV 82.0 07/17/2015   PLT 345 07/17/2015     Recent Labs Lab 07/18/15 0719  NA 134*  K 3.5  CL 95*  CO2 30  BUN 34*  CREATININE 1.92*  CALCIUM 8.7*  GLUCOSE 124*   No results found for: CHOL, HDL, LDLCALC, TRIG BNP (last 3 results)  Recent Labs  07/15/15 2002  BNP 1103.9*    ProBNP (last 3 results) No results for input(s): PROBNP in the last 8760 hours.   Diagnostic Studies/Procedures   No results found.  Discharge Medications     Medication List    STOP taking these medications        amLODipine 5 MG tablet  Commonly known as:  NORVASC     furosemide 80 MG tablet  Commonly known as:  LASIX      TAKE these medications        amiodarone 200 MG tablet  Commonly known as:  PACERONE  TAKE 1 TABLET BY MOUTH AT BEDTIME     carvedilol 3.125 MG tablet  Commonly known as:  COREG  Take 1 tablet (3.125 mg total) by mouth 2 (two) times daily with a meal.     GLUCOSAMINE PO  Take 1 tablet by mouth daily as needed. Joint Support     isosorbide-hydrALAZINE 20-37.5 MG tablet  Commonly known as:  BIDIL  Take 1 tablet  by mouth 3 (three) times daily.     MULTIVITAMIN PO  Take 2-3 tablets by mouth daily.     omega-3 acid ethyl esters 1 G capsule  Commonly known as:  LOVAZA  Take 1 g by mouth 3 (three) times daily.     OVER THE COUNTER MEDICATION  Take 1 tablet by mouth daily as needed (when ever he thinks he needs it). Ultimate prostate     potassium chloride SA 20 MEQ tablet  Commonly known as:  K-DUR,KLOR-CON  Take 2 tablets (40 mEq total) by mouth 2 (two) times daily.     rosuvastatin 5 MG tablet  Commonly known as:  CRESTOR  Take 1 tablet (5 mg total) by mouth at bedtime.     torsemide 20 MG tablet  Commonly known as:  DEMADEX  Take 3 tablets (60 mg total) by mouth 2 (two) times daily.     warfarin 5 MG tablet  Commonly known as:  COUMADIN  Take 5 mg Mon-Fri. On Sat and Sun take 2.5 mg.        Disposition   The patient will be discharged in stable condition to home. Discharge Instructions    Amb Referral to Cardiac Rehabilitation    Complete by:  As  directed   Diagnosis:  Heart Failure (see criteria below)     Diet - low sodium heart healthy    Complete by:  As directed      Heart Failure patients record your daily weight using the same scale at the same time of day    Complete by:  As directed      Increase activity slowly    Complete by:  As directed           Heart Failure Clinic is located on the 1st floor of [facility]. Entrance C located off [employer].  Follow-up Information    Follow up with Loralie Champagne, MD On 07/25/2015.   Specialty:  Cardiology   Why:  at [facility] information:   [address] [place] [address] [phone]       Follow up with [facility] On 07/24/2015.   Specialty:  Cardiology   Why:  Coumadin Clinic at 11:45   Contact information:   [address] [place] [phone]        Duration of Discharge Encounter: Greater than  35 minutes   Signed, Darrick Grinder  NP-C  07/18/2015, 10:58 AM

## 2015-07-19 ENCOUNTER — Other Ambulatory Visit: Payer: Self-pay | Admitting: Interventional Cardiology

## 2015-07-25 ENCOUNTER — Encounter (HOSPITAL_COMMUNITY): Payer: Self-pay

## 2015-07-25 ENCOUNTER — Ambulatory Visit (HOSPITAL_COMMUNITY)
Admit: 2015-07-25 | Discharge: 2015-07-25 | Disposition: A | Discharge status: Home / Self Care | Payer: Medicare Other | Attending: Cardiology | Admitting: Cardiology

## 2015-07-25 ENCOUNTER — Telehealth (HOSPITAL_COMMUNITY): Payer: Self-pay | Admitting: *Deleted

## 2015-07-25 ENCOUNTER — Ambulatory Visit (INDEPENDENT_AMBULATORY_CARE_PROVIDER_SITE_OTHER): Payer: Medicare Other | Admitting: *Deleted

## 2015-07-25 VITALS — BP 100/50 | HR 84 | Wt 196.4 lb

## 2015-07-25 DIAGNOSIS — I4892 Unspecified atrial flutter: Secondary | ICD-10-CM | POA: Diagnosis not present

## 2015-07-25 DIAGNOSIS — Z7901 Long term (current) use of anticoagulants: Secondary | ICD-10-CM | POA: Diagnosis not present

## 2015-07-25 DIAGNOSIS — E785 Hyperlipidemia, unspecified: Secondary | ICD-10-CM | POA: Insufficient documentation

## 2015-07-25 DIAGNOSIS — Z953 Presence of xenogenic heart valve: Secondary | ICD-10-CM | POA: Insufficient documentation

## 2015-07-25 DIAGNOSIS — T8209XA Other mechanical complication of heart valve prosthesis, initial encounter: Secondary | ICD-10-CM

## 2015-07-25 DIAGNOSIS — I480 Paroxysmal atrial fibrillation: Secondary | ICD-10-CM

## 2015-07-25 DIAGNOSIS — I97110 Postprocedural cardiac insufficiency following cardiac surgery: Secondary | ICD-10-CM

## 2015-07-25 DIAGNOSIS — I2510 Atherosclerotic heart disease of native coronary artery without angina pectoris: Secondary | ICD-10-CM | POA: Diagnosis not present

## 2015-07-25 DIAGNOSIS — N183 Chronic kidney disease, stage 3 unspecified: Secondary | ICD-10-CM

## 2015-07-25 DIAGNOSIS — Z87891 Personal history of nicotine dependence: Secondary | ICD-10-CM | POA: Insufficient documentation

## 2015-07-25 DIAGNOSIS — I5022 Chronic systolic (congestive) heart failure: Secondary | ICD-10-CM | POA: Insufficient documentation

## 2015-07-25 DIAGNOSIS — I272 Other secondary pulmonary hypertension: Secondary | ICD-10-CM | POA: Insufficient documentation

## 2015-07-25 DIAGNOSIS — Z951 Presence of aortocoronary bypass graft: Secondary | ICD-10-CM | POA: Diagnosis not present

## 2015-07-25 DIAGNOSIS — I5023 Acute on chronic systolic (congestive) heart failure: Secondary | ICD-10-CM

## 2015-07-25 DIAGNOSIS — T8201XA Breakdown (mechanical) of heart valve prosthesis, initial encounter: Secondary | ICD-10-CM

## 2015-07-25 DIAGNOSIS — I6529 Occlusion and stenosis of unspecified carotid artery: Secondary | ICD-10-CM | POA: Insufficient documentation

## 2015-07-25 LAB — COMPREHENSIVE METABOLIC PANEL
ALT: 117 U/L — AB (ref 17–63)
AST: 107 U/L — AB (ref 15–41)
Albumin: 3.3 g/dL — ABNORMAL LOW (ref 3.5–5.0)
Alkaline Phosphatase: 79 U/L (ref 38–126)
Anion gap: 9 (ref 5–15)
BILIRUBIN TOTAL: 0.6 mg/dL (ref 0.3–1.2)
BUN: 58 mg/dL — AB (ref 6–20)
CHLORIDE: 98 mmol/L — AB (ref 101–111)
CO2: 26 mmol/L (ref 22–32)
CREATININE: 2.7 mg/dL — AB (ref 0.61–1.24)
Calcium: 9.6 mg/dL (ref 8.9–10.3)
GFR, EST AFRICAN AMERICAN: 25 mL/min — AB (ref >60)
GFR, EST NON AFRICAN AMERICAN: 22 mL/min — AB (ref >60)
Glucose, Bld: 202 mg/dL — ABNORMAL HIGH (ref 65–99)
Potassium: 4.9 mmol/L (ref 3.5–5.1)
Sodium: 133 mmol/L — ABNORMAL LOW (ref 135–145)
TOTAL PROTEIN: 7.7 g/dL (ref 6.5–8.1)

## 2015-07-25 LAB — BRAIN NATRIURETIC PEPTIDE: B Natriuretic Peptide: 908.3 pg/mL — ABNORMAL HIGH (ref 0.0–100.0)

## 2015-07-25 LAB — POCT INR: INR: 3.6

## 2015-07-25 MED ORDER — ISOSORB DINITRATE-HYDRALAZINE 20-37.5 MG PO TABS: 0.5000 | ORAL_TABLET | Freq: Three times a day (TID) | ORAL | Status: AC

## 2015-07-25 MED ORDER — TORSEMIDE 20 MG PO TABS: ORAL_TABLET | ORAL | Status: AC

## 2015-07-25 NOTE — Progress Notes (Signed)
Patient ID: [name] [name], male   DOB: 07/13/1941, 74 y.o.   MRN: [id number]  Cardiology: Dr. Tamala Julian HF Cardiology: Dr. Omar Person is a 74 y.o. male followed with hx of chronic systolic CHF ECHO 123456 LVEF 35%, CAD s/p CABG x 3 in 2010, paroxysmal atrial fibrillation on amiodarone, CKD stage 3, s/p bioprosthetic AVR, bilateral carotid artery stenosis, PAD with claudication, HTN, and DM who is followed as an outpatient by Dr Tamala Julian.   Noted increased SOB, orthopnea, and fatigue since 07/04/15. Seen in urgent care 10/29 with no changes made. Had high risk Cardiolite on 07/08/15 with large, severe, partially reversible anterior lateral defect and EF 30% with global hypokinesis and scheduled for [facility] with Dr Tamala Julian on 07/15/15.  LHC 07/15/15 with severe native CAD but patent grafts x 3 and RHC showed Fick CI 2.19, LVEDP 30 mmHg, PWCP mean 36 mmHg with large v-waves consistent with volume overload/pulmonary venous hypertension and mitral valve regurgitation.CHF team consulted for decompensated CHF with newly diagnosed systolic component.  He was aggressively diuresed for marked volume overload.  Given concern for MR that was possibly worse than indicated by TTE, he had TEE showing moderate peri-valvular aortic insufficiency and moderate MR with EF 35%.  He was discharged on torsemide 60 mg bid (taking Lasix 120 mg bid prior to appointment).   He comes in for post-hospital followup.  Weight is down 20 lbs compared to his appointment prior to hospitalization.  SBP is running in the 100s-110s.  He feels that this is too low and feels "wiped out."  He is tired after walking 20-30 feet but not necessarily short of breath.   This has actually been worse for the last 2-3 days.  He has been constipated and is having a hard time sleeping.  He is sleepy during the day.  His wife says that he does not snore.  He does seem to have some orthopnea, occasionally but not always using 2-3 pillows.  No chest pain.    Labs (11/16): K 3.5, creatinine 1.92 => 2.7, BUN 34 => 58, AST 54 => 107, ALT 54 => 117, TSH normal  ECG (10/16): NSR, IVCD 132 msec  PMH: 1. CAD: s/p CABG in 2010.  LHC (11/16) with total occlusion of the ostial LAD, proximal circumflex, and segmental 95-99% stenosis in the mid RCA, the native LAD proximal to the LIMA insertion site is diffusely diseased, 2 large diagonal branches are totally occluded; widely patent bypass grafts including the saphenous vein graft to the distal RCA, saphenous vein graft to the distal circumflex/obtuse marginal #3, and the LIMA to the LAD. 2. Bioprosthetic aortic valve replacement.  3. Atrial fibrillation: Paroxysmal.  On amiodarone.  4. Chronic systolic CHF: Ischemic cardiomyopathy.  EF 30% on 11/16 Cardiolite.  Echo (11/16) with  EF 35%, diffuse hypokinesis, moderate MR, bioprosthetic aortic valve with mild to moderate AI, mildly decreased RV systolic function, PASP 51 mmHg.  TEE (11/16) with EF 35%, moderate MR, bioprosthetic aortic valve with moderate peri-valvular AI.  RHC (11/16) with RA 18, PA 62/28 mean 43, PCWP 36, CI 2.19.  5. Pulmonary venous hypertension. 6. CKD stage III 7. Carotid stenosis: Dopplers 3/16 with [id number] RICA, 123456 LICA.   8. Atrial flutter: s/p ablation in 2014.   9. Hyperlipidemia  Social History:  The patient reports that he quit smoking about 36 years ago. His smoking use included Cigarettes. He started smoking about 46 years ago. He has a 1.3 pack-year smoking history. He has  never used smokeless tobacco. He reports that he drinks about 1.2 oz of alcohol per week. He reports that he does not use illicit drugs.   Family History:  The patient's family history includes Heart disease in his mother.   ROS: All systems reviewed and negative except as per HPI.   Current Outpatient Prescriptions  Medication Sig Dispense Refill  . amiodarone (PACERONE) 200 MG tablet TAKE 1 TABLET BY MOUTH AT BEDTIME 30 tablet 5  . carvedilol  (COREG) 3.125 MG tablet Take 1 tablet (3.125 mg total) by mouth 2 (two) times daily with a meal. 60 tablet 6  . Glucosamine HCl (GLUCOSAMINE PO) Take 1 tablet by mouth daily as needed. Joint Support    . isosorbide-hydrALAZINE (BIDIL) 20-37.5 MG tablet Take 0.5 tablets by mouth 3 (three) times daily. 90 tablet 6  . Multiple Vitamins-Minerals (MULTIVITAMIN PO) Take 2-3 tablets by mouth daily.    Marland Kitchen omega-3 acid ethyl esters (LOVAZA) 1 G capsule Take 1 g by mouth 3 (three) times daily.    Marland Kitchen OVER THE COUNTER MEDICATION Take 1 tablet by mouth daily as needed (when ever he thinks he needs it). Ultimate prostate    . potassium chloride SA (K-DUR,KLOR-CON) 20 MEQ tablet Take 2 tablets (40 mEq total) by mouth 2 (two) times daily. 120 tablet 6  . rosuvastatin (CRESTOR) 5 MG tablet Take 1 tablet (5 mg total) by mouth at bedtime. 90 tablet 1  . warfarin (COUMADIN) 5 MG tablet Take 5 mg Mon-Fri. On Sat and Sun take 2.5 mg. 30 tablet 2  . torsemide (DEMADEX) 20 MG tablet Take 60 mg (3 tablets) in the am and 40 mg (2 tablets) in the pm 180 tablet 6   No current facility-administered medications for this encounter.   BP 100/50 mmHg  Pulse 84  Wt 196 lb 6.4 oz (89.086 kg)  SpO2 94% General: NAD Neck: JVP 9-10 cm, no thyromegaly or thyroid nodule.  Lungs: Clear to auscultation bilaterally with normal respiratory effort. CV: Nondisplaced PMI.  Heart regular S1/S2, no S3/S4, 2/6 HSM at apex, 2/6 diastolic murmur along the sternal border.  1+ edema bilateral ankles.  No carotid bruit.  Normal pedal pulses.  Abdomen: Soft, nontender, no hepatosplenomegaly, no distention.  Skin: Intact without lesions or rashes.  Neurologic: Alert and oriented x 3.  Psych: Normal affect. Extremities: No clubbing or cyanosis.  HEENT: Normal.   Assessment/Plan: 1. Chronic systolic CHF: Ischemic cardiomyopathy.  EF 35% by echo and TEE.  Markedly volume overloaded on RHC during last hospitalization.  Weight is down 20 lbs since  his appt before hospitalization, but he still looks at least mildly volume overloaded on exam.  Unfortunately, his renal function looks worse today with creatinine 2.7.  He also reports feeling "washed out" and thinks that his BP has been too low (SBP 100s-110s when he was used to the 130s).  NYHA class III-IIIb symptoms.  - Decrease Bidil to 0.5 mg tid to see if this is more tolerable.  - Continue Coreg at current dose.  - Balance between keeping fluid off and renal function looks like it is going to be difficult.  He will continue torsemide 60 mg qam but will hold afternoon torsemide for 3 days. He will then start back on torsemide 60 qam/40 qpm after 3 days. He needs followup in 10 days in the office with BMET.  2. CAD: s/p CABG.  Severe native vessel CAD on recent cath but patent grafts.  Continue Crestor.  No ASA  given stable CAD and use of warfarin.  3. Atrial fibrillation: Paroxysmal.  He is in NSR on amiodarone and warfarin.  Recent TSH was normal.  However, LFTs have been elevated, in the 100s range today.  This may be due to CHF/hepatic congestion but they are a bit higher than when in the hospital.  Will continue to follow LFTs closely but will continue amiodarone for now.  4. CKD: Stage III-IV.  Creatinine higher today, as above backing off on torsemide a bit and also decreasing Bidil to promote higher BP.  5. Bioprosthetic AVR: Moderate AI on recent TEE (peri-valvular). 6. Mitral regurgitation: Moderate by TEE.   Loralie Champagne 07/25/2015 5:05 PM

## 2015-07-25 NOTE — Patient Instructions (Signed)
Medications:  Decrease Bidil 1/2 tablet three times a day  OTC Melatonin   OTC Colace  Labs:  Today will call if abnormal   Follow up:  12/2 @ 3:40 with Dr Aundra Dubin

## 2015-07-25 NOTE — Telephone Encounter (Signed)
Called pt about lab results and medication changes.

## 2015-07-26 ENCOUNTER — Encounter (HOSPITAL_COMMUNITY): Payer: Self-pay | Admitting: *Deleted

## 2015-07-26 ENCOUNTER — Emergency Department (HOSPITAL_COMMUNITY)
Admission: EM | Admit: 2015-07-26 | Discharge: 2015-08-07 | Disposition: E | Payer: Medicare Other | Attending: Emergency Medicine | Admitting: Emergency Medicine

## 2015-07-26 DIAGNOSIS — I25119 Atherosclerotic heart disease of native coronary artery with unspecified angina pectoris: Secondary | ICD-10-CM | POA: Insufficient documentation

## 2015-07-26 DIAGNOSIS — I4892 Unspecified atrial flutter: Secondary | ICD-10-CM | POA: Insufficient documentation

## 2015-07-26 DIAGNOSIS — Z9889 Other specified postprocedural states: Secondary | ICD-10-CM | POA: Diagnosis not present

## 2015-07-26 DIAGNOSIS — Z951 Presence of aortocoronary bypass graft: Secondary | ICD-10-CM | POA: Diagnosis not present

## 2015-07-26 DIAGNOSIS — Z7901 Long term (current) use of anticoagulants: Secondary | ICD-10-CM | POA: Insufficient documentation

## 2015-07-26 DIAGNOSIS — Z79899 Other long term (current) drug therapy: Secondary | ICD-10-CM | POA: Diagnosis not present

## 2015-07-26 DIAGNOSIS — Z87891 Personal history of nicotine dependence: Secondary | ICD-10-CM | POA: Diagnosis not present

## 2015-07-26 DIAGNOSIS — I469 Cardiac arrest, cause unspecified: Secondary | ICD-10-CM | POA: Diagnosis not present

## 2015-07-26 DIAGNOSIS — I10 Essential (primary) hypertension: Secondary | ICD-10-CM | POA: Diagnosis not present

## 2015-07-26 DIAGNOSIS — Z8669 Personal history of other diseases of the nervous system and sense organs: Secondary | ICD-10-CM | POA: Insufficient documentation

## 2015-07-26 MED ORDER — CALCIUM CHLORIDE 10 % IV SOLN
INTRAVENOUS | Status: AC | PRN
Start: 2015-07-26 — End: 2015-07-26
  Administered 2015-07-26: 1 g via INTRAVENOUS

## 2015-07-29 MED FILL — Medication: Qty: 1 | Status: AC

## 2015-07-30 ENCOUNTER — Other Ambulatory Visit (HOSPITAL_COMMUNITY): Payer: Medicare Other

## 2015-08-04 ENCOUNTER — Ambulatory Visit: Payer: Self-pay | Admitting: Internal Medicine

## 2015-08-04 DIAGNOSIS — Z7901 Long term (current) use of anticoagulants: Secondary | ICD-10-CM

## 2015-08-04 DIAGNOSIS — I4892 Unspecified atrial flutter: Secondary | ICD-10-CM

## 2015-08-07 NOTE — ED Provider Notes (Addendum)
CSN: [id number]     Arrival date & time 08/21/2015  0254 History  By signing my name below, I, [name] [name], attest that this documentation has been prepared under the direction and in the presence of Everlene Balls, MD. Electronically Signed: [name], Education administrator. 21-Aug-2015. 3:33 AM.    Chief Complaint  Patient presents with  . Cardiac Arrest    The history is provided by the EMS personnel. The history is limited by the condition of the patient. No language interpreter was used.    LEVEL 5 CAVEAT: Patient Unresponsive  HPI Comments: [name] [name] is a 74 y.o. male brought in by ambulance, who presents to the Emergency Department complaining of cardiac arrest that occurred ~1 hour PTA. Per EMS, patient had an unwitnessed cardiac arrest prior to their arrival. Per wife, patient had complained of abdominal pain earlier yesterday and seemed uncomfortable. He went to the bathroom which is where the wife found him slumped over and unresponsive.   Past Medical History  Diagnosis Date  . Coronary artery disease   . Angina   . Hypertension   . Atrial flutter ([place])   . Obstructive sleep apnea     severe,states had test, no machine, used oxygen in past, not now   Past Surgical History  Procedure Laterality Date  . Tee without cardioversion  11/30/2011    Procedure: TRANSESOPHAGEAL ECHOCARDIOGRAM (TEE);  Surgeon: Sueanne Margarita, MD;  Location: [facility] ENDOSCOPY;  Service: Cardiovascular;  Laterality: N/A;  . Coronary artery bypass graft      x3 vessels  . Atrial flutter ablation  07-05-13    05-15-13  . Colonoscopy with propofol N/A 07/17/2013    Procedure: COLONOSCOPY WITH PROPOFOL;  Surgeon: Garlan Fair, MD;  Location: [facility] ENDOSCOPY;  Service: Endoscopy;  Laterality: N/A;  . Esophagogastroduodenoscopy (egd) with propofol N/A 07/17/2013    Procedure: ESOPHAGOGASTRODUODENOSCOPY (EGD) WITH PROPOFOL;  Surgeon: Garlan Fair, MD;  Location: [facility] ENDOSCOPY;  Service: Endoscopy;  Laterality: N/A;  .  Atrial flutter ablation N/A 05/15/2013    Procedure: ATRIAL FLUTTER ABLATION;  Surgeon: Coralyn Mark, MD;  Location: [facility] CATH LAB;  Service: Cardiovascular;  Laterality: N/A;  . Cardiac catheterization N/A 07/15/2015    Procedure: Right/Left Heart Cath and Coronary/Graft Angiography;  Surgeon: Belva Crome, MD;  Location: [facility] CV LAB;  Service: Cardiovascular;  Laterality: N/A;  . Cardiac valve replacement      aortic valve replacement (bioprosthetic)  . Tee without cardioversion N/A 07/17/2015    Procedure: TRANSESOPHAGEAL ECHOCARDIOGRAM (TEE);  Surgeon: Larey Dresser, MD;  Location: [facility] ENDOSCOPY;  Service: Cardiovascular;  Laterality: N/A;   Family History  Problem Relation Age of Onset  . Heart disease Mother    Social History  Substance Use Topics  . Smoking status: Former Smoker -- 0.13 packs/day for 10 years    Types: Cigarettes    Start date: 07/09/1969    Quit date: 11/29/1978  . Smokeless tobacco: Never Used  . Alcohol Use: 1.2 oz/week    2 Glasses of wine per week     Comment: daily    Review of Systems  Unable to perform ROS: Patient unresponsive      Allergies  Review of patient's allergies indicates no known allergies.  Home Medications   Prior to Admission medications   Medication Sig Start Date End Date Taking? Authorizing Provider  amiodarone (PACERONE) 200 MG tablet TAKE 1 TABLET BY MOUTH AT BEDTIME 07/21/15   Belva Crome, MD  carvedilol (  COREG) 3.125 MG tablet Take 1 tablet (3.125 mg total) by mouth 2 (two) times daily with a meal. 07/18/15   Amy D Clegg, NP  Glucosamine HCl (GLUCOSAMINE PO) Take 1 tablet by mouth daily as needed. Joint Support    Historical Provider, MD  isosorbide-hydrALAZINE (BIDIL) 20-37.5 MG tablet Take 0.5 tablets by mouth 3 (three) times daily. 07/25/15   Larey Dresser, MD  Multiple Vitamins-Minerals (MULTIVITAMIN PO) Take 2-3 tablets by mouth daily.    Historical Provider, MD  omega-3 acid ethyl esters (LOVAZA) 1 G  capsule Take 1 g by mouth 3 (three) times daily.    Historical Provider, MD  OVER THE COUNTER MEDICATION Take 1 tablet by mouth daily as needed (when ever he thinks he needs it). Ultimate prostate    Historical Provider, MD  potassium chloride SA (K-DUR,KLOR-CON) 20 MEQ tablet Take 2 tablets (40 mEq total) by mouth 2 (two) times daily. 07/18/15   Amy D Ninfa Meeker, NP  rosuvastatin (CRESTOR) 5 MG tablet Take 1 tablet (5 mg total) by mouth at bedtime. 04/07/15   Belva Crome, MD  torsemide (DEMADEX) 20 MG tablet Take 60 mg (3 tablets) in the am and 40 mg (2 tablets) in the pm 07/25/15   Larey Dresser, MD  warfarin (COUMADIN) 5 MG tablet Take 5 mg Mon-Fri. On Sat and Sun take 2.5 mg. 07/18/15   Amy D Clegg, NP   Wt 200 lb (90.719 kg) Physical Exam  Constitutional: Vital signs are normal. He appears well-developed and well-nourished.  Non-toxic appearance. He does not appear ill. No distress.  HENT:  Head: Normocephalic and atraumatic.  Eyes: No scleral icterus.  Neck: Neck supple. No tracheal deviation, no edema, no erythema and normal range of motion present. No thyroid mass and no thyromegaly present.  Cardiovascular: Normal rate, regular rhythm, S1 normal, S2 normal, normal heart sounds, intact distal pulses and normal pulses.  Exam reveals no gallop and no friction rub.   No murmur heard. Pulmonary/Chest: Effort normal and breath sounds normal. No respiratory distress. He has no wheezes. He has no rhonchi. He has no rales.  Abdominal: Soft. Normal appearance and bowel sounds are normal. He exhibits distension. He exhibits no ascites and no mass. There is no hepatosplenomegaly. There is no tenderness. There is no rebound, no guarding and no CVA tenderness.  Musculoskeletal: Normal range of motion. He exhibits no edema or tenderness.  Lymphadenopathy:    He has no cervical adenopathy.  Neurological: He has normal strength. No sensory deficit.  GCS 3, unresponsive  Skin: Skin is warm, dry and  intact. No petechiae and no rash noted. He is not diaphoretic. No erythema. No pallor.  Nursing note and vitals reviewed.   ED Course  Procedures (including critical care time)  COORDINATION OF CARE: 3:02 AM Time of death   Labs Review Labs Reviewed - No data to display  Imaging Review No results found. I have personally reviewed and evaluated these images and lab results as part of my medical decision-making.   EKG Interpretation None      MDM   Final diagnoses:  None    Patient presents emergency department and cardiac arrest. Prior to arrival, EMS has performed CPR for 1 hour and given 13 epinephrines. They did receive return of spontaneous circulation twice for the patient went to cardiac arrest again. Upon arrival to the emergency department, chest compressions were continued. Patient was given calcium, after 2 rounds of CPR, pulse checks continue to be asystole.  Patient's only known history is heart failure. At this time, and has CO2 is less than 20. Time of death was called at 3:02 AM.  I spoke with the family and updated them, they're requesting to be performed.  I discussed the case with the medical examiner who is refusing the case. Patient was sent to the morgue.  Cardiopulmonary Resuscitation (CPR) Procedure Note Directed/Performed by: Everlene Balls I personally directed ancillary staff and/or performed CPR in an effort to regain return of spontaneous circulation and to maintain cardiac, neuro and systemic perfusion.       I personally performed the services described in this documentation, which was scribed in my presence. The recorded information has been reviewed and is accurate.     Everlene Balls, MD 07-29-2015 [id number]  Everlene Balls, MD 08/13/15 [id number]

## 2015-08-07 NOTE — ED Notes (Signed)
Unwitnessed arrest. Wife states pt. Was up and about during the day and c/o abdominal pain. Pt went to the bathroom and wife found pt slumped over unresponsive

## 2015-08-07 NOTE — Progress Notes (Signed)
Chaplain responded to CPR in progress for pt coming to Trauma C. CPR was not successful. I met pt's wife and two sons in ED waiting room and brought them to consult room. I brought Dr. Claudine Mouton to speak with family and he informed them that pt had died. Chaplain provided grief support to pt's wife. Chaplain escorted pt's family to Trauma C to say their goodbyes. I obtained family contact information and name of funeral home and gave to pt's nurse and then accompanied the family out of the department.

## 2015-08-07 NOTE — Progress Notes (Signed)
60 YOM cardiac arrest from EMS.  RT assisted with ventilation via 4 king placed prehospitally.  Total time ~15 minutes.

## 2015-08-07 DEATH — deceased

## 2015-08-08 ENCOUNTER — Encounter (HOSPITAL_COMMUNITY): Payer: Medicare Other

## 2017-02-19 IMAGING — NM NM MISC PROCEDURE
6 series · 36 of 36 positions shown · non-contrast
Comparison: none

[Series 1: wbr_r-proj_st wbr rest · 6.40mm/px · 6 of 64 frames shown]
[frame 6/64]
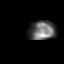
[frame 16/64]
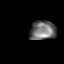
[frame 27/64]
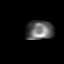
[frame 38/64]
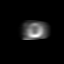
[frame 48/64]
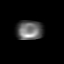
[frame 59/64]
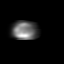

[Series 1: wbr rest · 6.40mm/px · 6 of 63 frames shown]
[frame 6/63]
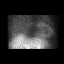
[frame 16/63]
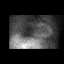
[frame 27/63]
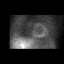
[frame 37/63]
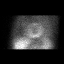
[frame 48/63]
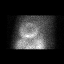
[frame 58/63]
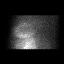

[Series 2: wbr stress-gsp · 6.40mm/px · 6 of 472 frames shown]
[frame 40/472  full-range]
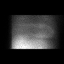
[frame 118/472  full-range]
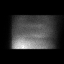
[frame 197/472  full-range]
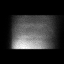
[frame 276/472  full-range]
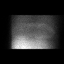
[frame 354/472  full-range]
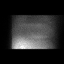
[frame 433/472  full-range]
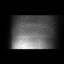

[Series 2: wbr_s-proj_st wbr stress-gsp · 6.40mm/px · 6 of 512 frames shown]
[frame 43/512]
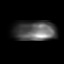
[frame 128/512]
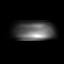
[frame 214/512]
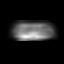
[frame 299/512]
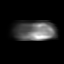
[frame 384/512]
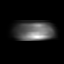
[frame 470/512]
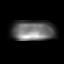

[Series 3: wbr stress-sum-em · 6.40mm/px · 6 of 64 frames shown]
[frame 6/64]
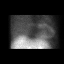
[frame 16/64]
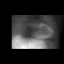
[frame 27/64]
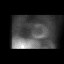
[frame 38/64]
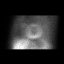
[frame 48/64]
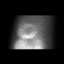
[frame 59/64]
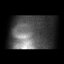

[Series 3: wbr_s-proj_st wbr stress-sum-em · 6.40mm/px · 6 of 64 frames shown]
[frame 6/64]
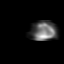
[frame 16/64]
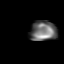
[frame 27/64]
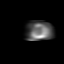
[frame 38/64]
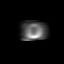
[frame 48/64]
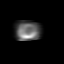
[frame 59/64]
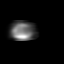

[36 of 36 positions shown; findings below may reference images not displayed]

Canned report from images found in remote index.

Refer to host system for actual result text.
